# Patient Record
Sex: Female | Born: 1985 | Race: Black or African American | Hispanic: No
Health system: Midwestern US, Community
[De-identification: ages and names within clinical notes are randomized; demographics above are authoritative.]

---

## 2011-06-07 ENCOUNTER — Ambulatory Visit: Payer: Self-pay | Admitting: Internal Medicine

## 2011-06-22 ENCOUNTER — Inpatient Hospital Stay: Payer: Self-pay | Admitting: Internal Medicine

## 2011-07-08 ENCOUNTER — Ambulatory Visit: Payer: Self-pay | Admitting: Internal Medicine

## 2011-07-17 ENCOUNTER — Emergency Department: Payer: Self-pay | Admitting: Emergency Medicine

## 2011-08-07 ENCOUNTER — Ambulatory Visit: Payer: Self-pay | Admitting: Internal Medicine

## 2011-08-29 ENCOUNTER — Inpatient Hospital Stay: Payer: Self-pay | Admitting: Internal Medicine

## 2011-09-07 ENCOUNTER — Ambulatory Visit: Payer: Self-pay | Admitting: Internal Medicine

## 2011-10-07 ENCOUNTER — Ambulatory Visit: Payer: Self-pay | Admitting: Internal Medicine

## 2011-10-07 ENCOUNTER — Inpatient Hospital Stay: Payer: Self-pay | Admitting: Internal Medicine

## 2011-11-07 ENCOUNTER — Ambulatory Visit: Payer: Self-pay | Admitting: Internal Medicine

## 2011-11-11 ENCOUNTER — Emergency Department: Payer: Self-pay | Admitting: Emergency Medicine

## 2011-11-11 LAB — URINALYSIS, COMPLETE
Bilirubin,UR: NEGATIVE
Blood: NEGATIVE
Nitrite: NEGATIVE
Ph: 5 (ref 4.5–8.0)
Protein: 30
RBC,UR: 1 /HPF (ref 0–5)
Squamous Epithelial: 3

## 2011-11-11 LAB — COMPREHENSIVE METABOLIC PANEL
Alkaline Phosphatase: 51 U/L (ref 50–136)
Calcium, Total: 8.2 mg/dL — ABNORMAL LOW (ref 8.5–10.1)
Chloride: 113 mmol/L — ABNORMAL HIGH (ref 98–107)
Co2: 19 mmol/L — ABNORMAL LOW (ref 21–32)
EGFR (African American): >60
EGFR (Non-African Amer.): >60
Glucose: 88 mg/dL (ref 65–99)
Potassium: 3.7 mmol/L (ref 3.5–5.1)
SGOT(AST): 16 U/L (ref 15–37)
SGPT (ALT): 19 U/L
Total Protein: 7.6 g/dL (ref 6.4–8.2)

## 2011-11-11 LAB — CBC WITH DIFFERENTIAL/PLATELET
Bands: 1 %
Basophil: 1 %
HCT: 25.5 % — ABNORMAL LOW (ref 35.0–47.0)
Lymphocytes: 35 %
MCHC: 32.9 g/dL (ref 32.0–36.0)
MCV: 98 fL (ref 80–100)
Monocytes: 10 %
NRBC/100 WBC: 2 /
RBC: 2.6 10*6/uL — ABNORMAL LOW (ref 3.80–5.20)
RDW: 16.7 % — ABNORMAL HIGH (ref 11.5–14.5)
Segmented Neutrophils: 51 %
WBC: 12.6 10*3/uL — ABNORMAL HIGH (ref 3.6–11.0)

## 2011-11-11 LAB — PREGNANCY, URINE: Pregnancy Test, Urine: NEGATIVE m[IU]/mL

## 2011-11-12 LAB — RETICULOCYTES
Absolute Retic Count: 0.091 10*6/uL — ABNORMAL HIGH (ref 0.024–0.084)
Reticulocyte: 3.5 % — ABNORMAL HIGH (ref 0.5–1.5)

## 2011-12-06 ENCOUNTER — Inpatient Hospital Stay: Payer: Self-pay | Admitting: Internal Medicine

## 2011-12-06 LAB — URINALYSIS, COMPLETE
Bilirubin,UR: NEGATIVE
Blood: NEGATIVE
Glucose,UR: NEGATIVE mg/dL (ref 0–75)
Hyaline Cast: 5
Ketone: NEGATIVE
Nitrite: NEGATIVE
Squamous Epithelial: 3

## 2011-12-06 LAB — COMPREHENSIVE METABOLIC PANEL
Albumin: 4.1 g/dL (ref 3.4–5.0)
Alkaline Phosphatase: 47 U/L — ABNORMAL LOW (ref 50–136)
Anion Gap: 12 (ref 7–16)
BUN: 9 mg/dL (ref 7–18)
Bilirubin,Total: 0.5 mg/dL (ref 0.2–1.0)
Calcium, Total: 8.7 mg/dL (ref 8.5–10.1)
EGFR (African American): >60
EGFR (Non-African Amer.): >60
Glucose: 109 mg/dL — ABNORMAL HIGH (ref 65–99)
Osmolality: 281 (ref 275–301)
Potassium: 3.4 mmol/L — ABNORMAL LOW (ref 3.5–5.1)
SGPT (ALT): 22 U/L
Sodium: 141 mmol/L (ref 136–145)
Total Protein: 7.5 g/dL (ref 6.4–8.2)

## 2011-12-06 LAB — CBC
Platelet: 385 10*3/uL (ref 150–440)
RBC: 2.38 10*6/uL — ABNORMAL LOW (ref 3.80–5.20)
RDW: 17.8 % — ABNORMAL HIGH (ref 11.5–14.5)
WBC: 10.6 10*3/uL (ref 3.6–11.0)

## 2011-12-06 LAB — RETICULOCYTES: Reticulocyte: 3.8 % — ABNORMAL HIGH (ref 0.5–1.5)

## 2011-12-07 LAB — CBC WITH DIFFERENTIAL/PLATELET
Basophil %: 0.6 %
Eosinophil #: 0.4 10*3/uL (ref 0.0–0.7)
Eosinophil %: 3.6 %
HGB: 6.4 g/dL — ABNORMAL LOW (ref 12.0–16.0)
Lymphocyte #: 4.5 10*3/uL — ABNORMAL HIGH (ref 1.0–3.6)
MCH: 32.6 pg (ref 26.0–34.0)
MCHC: 33.2 g/dL (ref 32.0–36.0)
MCV: 98 fL (ref 80–100)
Monocyte #: 0.3 10*3/uL (ref 0.0–0.7)
Neutrophil #: 5.3 10*3/uL (ref 1.4–6.5)
Neutrophil %: 50.2 %
RBC: 1.96 10*6/uL — ABNORMAL LOW (ref 3.80–5.20)
RDW: 17.7 % — ABNORMAL HIGH (ref 11.5–14.5)

## 2011-12-07 LAB — COMPREHENSIVE METABOLIC PANEL
Alkaline Phosphatase: 46 U/L — ABNORMAL LOW (ref 50–136)
BUN: 5 mg/dL — ABNORMAL LOW (ref 7–18)
Bilirubin,Total: 0.7 mg/dL (ref 0.2–1.0)
Chloride: 116 mmol/L — ABNORMAL HIGH (ref 98–107)
Creatinine: 0.64 mg/dL (ref 0.60–1.30)
EGFR (African American): >60
EGFR (Non-African Amer.): >60
Glucose: 105 mg/dL — ABNORMAL HIGH (ref 65–99)
SGPT (ALT): 18 U/L
Sodium: 143 mmol/L (ref 136–145)
Total Protein: 6.4 g/dL (ref 6.4–8.2)

## 2011-12-10 LAB — BASIC METABOLIC PANEL
Anion Gap: 6 — ABNORMAL LOW (ref 7–16)
BUN: 5 mg/dL — ABNORMAL LOW (ref 7–18)
Chloride: 113 mmol/L — ABNORMAL HIGH (ref 98–107)
Co2: 23 mmol/L (ref 21–32)
Creatinine: 0.67 mg/dL (ref 0.60–1.30)
EGFR (African American): >60
EGFR (Non-African Amer.): >60
Glucose: 110 mg/dL — ABNORMAL HIGH (ref 65–99)
Osmolality: 281 (ref 275–301)

## 2011-12-10 LAB — HEMOGLOBIN: HGB: 7.9 g/dL — ABNORMAL LOW (ref 12.0–16.0)

## 2011-12-10 LAB — RETICULOCYTES
Absolute Retic Count: 0.079 10*6/uL (ref 0.024–0.084)
Reticulocyte: 3.2 % — ABNORMAL HIGH (ref 0.5–1.5)

## 2011-12-11 LAB — CULTURE, BLOOD (SINGLE)

## 2012-01-02 ENCOUNTER — Emergency Department: Payer: Self-pay | Admitting: *Deleted

## 2012-01-02 LAB — COMPREHENSIVE METABOLIC PANEL
Albumin: 4.1 g/dL (ref 3.4–5.0)
Anion Gap: 13 (ref 7–16)
Calcium, Total: 8.5 mg/dL (ref 8.5–10.1)
Chloride: 116 mmol/L — ABNORMAL HIGH (ref 98–107)
Co2: 15 mmol/L — ABNORMAL LOW (ref 21–32)
EGFR (African American): >60
EGFR (Non-African Amer.): >60
Osmolality: 286 (ref 275–301)
Potassium: 3 mmol/L — ABNORMAL LOW (ref 3.5–5.1)
SGOT(AST): 12 U/L — ABNORMAL LOW (ref 15–37)
SGPT (ALT): 14 U/L
Sodium: 144 mmol/L (ref 136–145)

## 2012-01-02 LAB — PREGNANCY, URINE: Pregnancy Test, Urine: NEGATIVE m[IU]/mL

## 2012-01-02 LAB — CBC WITH DIFFERENTIAL/PLATELET
Basophil #: 0 10*3/uL (ref 0.0–0.1)
Basophil %: 0.3 %
Eosinophil #: 0.2 10*3/uL (ref 0.0–0.7)
Eosinophil %: 2.4 %
HCT: 22.2 % — ABNORMAL LOW (ref 35.0–47.0)
HGB: 7.3 g/dL — ABNORMAL LOW (ref 12.0–16.0)
Lymphocyte %: 30.7 %
MCH: 33 pg (ref 26.0–34.0)
MCHC: 32.8 g/dL (ref 32.0–36.0)
MCV: 101 fL — ABNORMAL HIGH (ref 80–100)
Monocyte #: 0.5 10*3/uL (ref 0.0–0.7)
Monocyte %: 5.5 %
Monocytes: 3 %
Neutrophil #: 5.6 10*3/uL (ref 1.4–6.5)
Neutrophil %: 61.1 %
RBC: 2.21 10*6/uL — ABNORMAL LOW (ref 3.80–5.20)
WBC: 9.2 10*3/uL (ref 3.6–11.0)

## 2012-01-02 LAB — RETICULOCYTES: Reticulocyte: 3.6 % — ABNORMAL HIGH (ref 0.5–1.5)

## 2012-01-03 LAB — URINALYSIS, COMPLETE
Glucose,UR: NEGATIVE mg/dL (ref 0–75)
Ketone: NEGATIVE
Nitrite: NEGATIVE
Ph: 6 (ref 4.5–8.0)
RBC,UR: <1 /HPF (ref 0–5)
Specific Gravity: 1.02 (ref 1.003–1.030)
Squamous Epithelial: 6
WBC UR: 4 /HPF (ref 0–5)

## 2012-01-05 ENCOUNTER — Ambulatory Visit: Payer: Self-pay | Admitting: Internal Medicine

## 2012-01-23 LAB — URINALYSIS, COMPLETE
Glucose,UR: NEGATIVE mg/dL (ref 0–75)
Hyaline Cast: 3
Ketone: NEGATIVE
Leukocyte Esterase: NEGATIVE
Nitrite: NEGATIVE
Ph: 5 (ref 4.5–8.0)
Squamous Epithelial: 5
White Blood Cell Cast: 1

## 2012-01-23 LAB — COMPREHENSIVE METABOLIC PANEL
Albumin: 4.7 g/dL (ref 3.4–5.0)
Alkaline Phosphatase: 56 U/L (ref 50–136)
Anion Gap: 15 (ref 7–16)
Bilirubin,Total: 0.4 mg/dL (ref 0.2–1.0)
Co2: 12 mmol/L — ABNORMAL LOW (ref 21–32)
Creatinine: 0.79 mg/dL (ref 0.60–1.30)
Osmolality: 279 (ref 275–301)
Sodium: 140 mmol/L (ref 136–145)

## 2012-01-23 LAB — CBC
HGB: 8.2 g/dL — ABNORMAL LOW (ref 12.0–16.0)
MCH: 34.6 pg — ABNORMAL HIGH (ref 26.0–34.0)
MCV: 108 fL — ABNORMAL HIGH (ref 80–100)
Platelet: 262 10*3/uL (ref 150–440)
RBC: 2.36 10*6/uL — ABNORMAL LOW (ref 3.80–5.20)
RDW: 23.3 % — ABNORMAL HIGH (ref 11.5–14.5)
WBC: 8.3 10*3/uL (ref 3.6–11.0)

## 2012-01-23 LAB — LACTATE DEHYDROGENASE: LDH: 182 U/L (ref 84–246)

## 2012-01-23 LAB — DIFFERENTIAL
Basophil #: 0 10*3/uL (ref 0.0–0.1)
Eosinophil #: 0.3 10*3/uL (ref 0.0–0.7)
Eosinophil %: 3.9 %
Lymphocyte %: 41.9 %
Monocyte %: 6.7 %
Neutrophil #: 3.9 10*3/uL (ref 1.4–6.5)
Neutrophil %: 47.2 %

## 2012-01-23 LAB — RETICULOCYTES: Absolute Retic Count: 0.053 10*6/uL (ref 0.024–0.084)

## 2012-01-24 LAB — BASIC METABOLIC PANEL
BUN: 9 mg/dL (ref 7–18)
Chloride: 121 mmol/L — ABNORMAL HIGH (ref 98–107)
Creatinine: 0.67 mg/dL (ref 0.60–1.30)
EGFR (Non-African Amer.): >60
Glucose: 99 mg/dL (ref 65–99)
Potassium: 4.3 mmol/L (ref 3.5–5.1)
Sodium: 148 mmol/L — ABNORMAL HIGH (ref 136–145)

## 2012-01-25 ENCOUNTER — Inpatient Hospital Stay: Payer: Self-pay | Admitting: Internal Medicine

## 2012-01-25 LAB — CBC WITH DIFFERENTIAL/PLATELET
Basophil #: 0.2 10*3/uL — ABNORMAL HIGH (ref 0.0–0.1)
Basophil %: 2 %
Eosinophil #: 0.4 10*3/uL (ref 0.0–0.7)
Eosinophil %: 3.3 %
HGB: 7.1 g/dL — ABNORMAL LOW (ref 12.0–16.0)
Lymphocyte #: 3.8 10*3/uL — ABNORMAL HIGH (ref 1.0–3.6)
MCH: 35.3 pg — ABNORMAL HIGH (ref 26.0–34.0)
MCHC: 33.1 g/dL (ref 32.0–36.0)
MCV: 107 fL — ABNORMAL HIGH (ref 80–100)
Monocyte #: 0.8 10*3/uL — ABNORMAL HIGH (ref 0.0–0.7)
Neutrophil #: 6.2 10*3/uL (ref 1.4–6.5)
Platelet: 226 10*3/uL (ref 150–440)
RBC: 2 10*6/uL — ABNORMAL LOW (ref 3.80–5.20)
RDW: 24 % — ABNORMAL HIGH (ref 11.5–14.5)
WBC: 11.4 10*3/uL — ABNORMAL HIGH (ref 3.6–11.0)

## 2012-01-25 LAB — COMPREHENSIVE METABOLIC PANEL
Albumin: 4.3 g/dL (ref 3.4–5.0)
Alkaline Phosphatase: 60 U/L (ref 50–136)
Anion Gap: 10 (ref 7–16)
BUN: 7 mg/dL (ref 7–18)
Calcium, Total: 8.3 mg/dL — ABNORMAL LOW (ref 8.5–10.1)
Glucose: 72 mg/dL (ref 65–99)
Osmolality: 283 (ref 275–301)
Potassium: 4.3 mmol/L (ref 3.5–5.1)
SGOT(AST): 27 U/L (ref 15–37)
SGPT (ALT): 22 U/L
Total Protein: 7.4 g/dL (ref 6.4–8.2)

## 2012-01-27 LAB — BASIC METABOLIC PANEL
Chloride: 112 mmol/L — ABNORMAL HIGH (ref 98–107)
Co2: 19 mmol/L — ABNORMAL LOW (ref 21–32)
Creatinine: 0.6 mg/dL (ref 0.60–1.30)
Osmolality: 291 (ref 275–301)
Potassium: 3.8 mmol/L (ref 3.5–5.1)
Sodium: 146 mmol/L — ABNORMAL HIGH (ref 136–145)

## 2012-01-27 LAB — RETICULOCYTES
Absolute Retic Count: 0.027 10*6/uL (ref 0.024–0.084)
Reticulocyte: 1.4 % (ref 0.5–1.5)

## 2012-01-28 LAB — CBC WITH DIFFERENTIAL/PLATELET
Basophil #: 0.1 10*3/uL (ref 0.0–0.1)
Basophil %: 1.1 %
Eosinophil #: 0.3 10*3/uL (ref 0.0–0.7)
Eosinophil %: 3.1 %
Eosinophil: 2 %
HGB: 6.3 g/dL — ABNORMAL LOW (ref 12.0–16.0)
Lymphocyte %: 35.3 %
Lymphocytes: 35 %
MCHC: 32.7 g/dL (ref 32.0–36.0)
Monocytes: 3 %
Neutrophil %: 55.9 %
Platelet: 235 10*3/uL (ref 150–440)
RBC: 1.79 10*6/uL — ABNORMAL LOW (ref 3.80–5.20)
RDW: 23.4 % — ABNORMAL HIGH (ref 11.5–14.5)
Segmented Neutrophils: 60 %

## 2012-01-28 LAB — BASIC METABOLIC PANEL
Chloride: 112 mmol/L — ABNORMAL HIGH (ref 98–107)
Co2: 20 mmol/L — ABNORMAL LOW (ref 21–32)
Creatinine: 0.69 mg/dL (ref 0.60–1.30)
EGFR (Non-African Amer.): >60
Glucose: 133 mg/dL — ABNORMAL HIGH (ref 65–99)
Osmolality: 286 (ref 275–301)
Sodium: 144 mmol/L (ref 136–145)

## 2012-01-29 LAB — RETICULOCYTES: Reticulocyte: 2.7 % — ABNORMAL HIGH (ref 0.5–1.5)

## 2012-01-29 LAB — CBC WITH DIFFERENTIAL/PLATELET
Eosinophil %: 3.1 %
Eosinophil: 6 %
MCHC: 32.5 g/dL (ref 32.0–36.0)
MCV: 108 fL — ABNORMAL HIGH (ref 80–100)
Monocyte #: 0.4 10*3/uL (ref 0.0–0.7)
Monocyte %: 6.1 %
Monocytes: 8 %
Neutrophil #: 3.7 10*3/uL (ref 1.4–6.5)
Neutrophil %: 51.7 %
RDW: 23.1 % — ABNORMAL HIGH (ref 11.5–14.5)

## 2012-01-29 LAB — BASIC METABOLIC PANEL
Anion Gap: 11 (ref 7–16)
BUN: 5 mg/dL — ABNORMAL LOW (ref 7–18)
EGFR (African American): >60
EGFR (Non-African Amer.): >60
Glucose: 80 mg/dL (ref 65–99)
Osmolality: 285 (ref 275–301)
Potassium: 3.9 mmol/L (ref 3.5–5.1)

## 2012-01-30 LAB — CULTURE, BLOOD (SINGLE)

## 2012-02-01 ENCOUNTER — Emergency Department: Payer: Self-pay | Admitting: Emergency Medicine

## 2012-02-01 LAB — CBC WITH DIFFERENTIAL/PLATELET
HGB: 8.1 g/dL — ABNORMAL LOW (ref 12.0–16.0)
MCH: 35.5 pg — ABNORMAL HIGH (ref 26.0–34.0)
MCHC: 32.2 g/dL (ref 32.0–36.0)
Other Cells Blood: 14
Segmented Neutrophils: 49 %
WBC: 6.7 10*3/uL (ref 3.6–11.0)

## 2012-02-01 LAB — COMPREHENSIVE METABOLIC PANEL
Albumin: 4.6 g/dL (ref 3.4–5.0)
Alkaline Phosphatase: 55 U/L (ref 50–136)
BUN: 8 mg/dL (ref 7–18)
Bilirubin,Total: 0.6 mg/dL (ref 0.2–1.0)
Chloride: 109 mmol/L — ABNORMAL HIGH (ref 98–107)
Osmolality: 280 (ref 275–301)
Potassium: 3.4 mmol/L — ABNORMAL LOW (ref 3.5–5.1)
SGOT(AST): 21 U/L (ref 15–37)
Sodium: 141 mmol/L (ref 136–145)
Total Protein: 8.2 g/dL (ref 6.4–8.2)

## 2012-02-02 LAB — URINALYSIS, COMPLETE
Bilirubin,UR: NEGATIVE
Glucose,UR: NEGATIVE mg/dL (ref 0–75)
Hyaline Cast: 6
Ketone: NEGATIVE
Leukocyte Esterase: NEGATIVE
Nitrite: NEGATIVE
Ph: 5 (ref 4.5–8.0)
Specific Gravity: 1.014 (ref 1.003–1.030)
Squamous Epithelial: 2

## 2012-02-05 ENCOUNTER — Ambulatory Visit: Payer: Self-pay | Admitting: Internal Medicine

## 2012-03-02 ENCOUNTER — Emergency Department: Payer: Self-pay | Admitting: Emergency Medicine

## 2012-03-03 LAB — URINALYSIS, COMPLETE
Bacteria: NONE SEEN
Bilirubin,UR: NEGATIVE
Glucose,UR: NEGATIVE mg/dL (ref 0–75)
Ketone: NEGATIVE
Nitrite: NEGATIVE
Ph: 6 (ref 4.5–8.0)
Protein: 30
Specific Gravity: 1.012 (ref 1.003–1.030)
WBC UR: 15 /HPF (ref 0–5)

## 2012-03-03 LAB — COMPREHENSIVE METABOLIC PANEL
Albumin: 4.6 g/dL (ref 3.4–5.0)
BUN: 21 mg/dL — ABNORMAL HIGH (ref 7–18)
Calcium, Total: 8.8 mg/dL (ref 8.5–10.1)
Creatinine: 0.63 mg/dL (ref 0.60–1.30)
EGFR (Non-African Amer.): >60
Glucose: 91 mg/dL (ref 65–99)
Potassium: 3.2 mmol/L — ABNORMAL LOW (ref 3.5–5.1)
SGPT (ALT): 28 U/L
Sodium: 139 mmol/L (ref 136–145)
Total Protein: 9.1 g/dL — ABNORMAL HIGH (ref 6.4–8.2)

## 2012-03-03 LAB — PREGNANCY, URINE: Pregnancy Test, Urine: NEGATIVE m[IU]/mL

## 2012-03-03 LAB — CBC
HGB: 9 g/dL — ABNORMAL LOW (ref 12.0–16.0)
MCH: 34.8 pg — ABNORMAL HIGH (ref 26.0–34.0)
Platelet: 380 10*3/uL (ref 150–440)
RDW: 18 % — ABNORMAL HIGH (ref 11.5–14.5)
WBC: 12.2 10*3/uL — ABNORMAL HIGH (ref 3.6–11.0)

## 2012-03-03 LAB — RETICULOCYTES: Absolute Retic Count: 0.0399 10*6/uL (ref 0.024–0.084)

## 2012-03-03 LAB — LIPASE, BLOOD: Lipase: 166 U/L (ref 73–393)

## 2012-03-04 LAB — URINE CULTURE

## 2012-05-13 ENCOUNTER — Emergency Department: Payer: Self-pay | Admitting: Emergency Medicine

## 2012-05-13 LAB — CBC WITH DIFFERENTIAL/PLATELET
Basophil #: 0 10*3/uL (ref 0.0–0.1)
Basophil %: 0.5 %
Eosinophil #: 0.1 10*3/uL (ref 0.0–0.7)
HGB: 8.6 g/dL — ABNORMAL LOW (ref 12.0–16.0)
MCH: 31.2 pg (ref 26.0–34.0)
MCHC: 33.2 g/dL (ref 32.0–36.0)
Monocyte #: 0.5 x10 3/mm (ref 0.2–0.9)
Neutrophil %: 60.1 %
Platelet: 415 10*3/uL (ref 150–440)
RDW: 16 % — ABNORMAL HIGH (ref 11.5–14.5)

## 2012-05-13 LAB — URINALYSIS, COMPLETE
Glucose,UR: NEGATIVE mg/dL (ref 0–75)
Hyaline Cast: 7
Ketone: NEGATIVE
Ph: 5 (ref 4.5–8.0)
Protein: NEGATIVE
Specific Gravity: 1.024 (ref 1.003–1.030)
Squamous Epithelial: 11
WBC UR: 5 /HPF (ref 0–5)

## 2012-05-13 LAB — BASIC METABOLIC PANEL
Anion Gap: 14 (ref 7–16)
BUN: 11 mg/dL (ref 7–18)
Chloride: 113 mmol/L — ABNORMAL HIGH (ref 98–107)
Co2: 12 mmol/L — ABNORMAL LOW (ref 21–32)
Osmolality: 277 (ref 275–301)

## 2012-05-13 LAB — RETICULOCYTES: Absolute Retic Count: 0.085 10*6/uL — ABNORMAL HIGH (ref 0.024–0.084)

## 2012-05-13 LAB — PREGNANCY, URINE: Pregnancy Test, Urine: NEGATIVE m[IU]/mL

## 2013-03-21 IMAGING — XA IR VASCULAR PROCEDURE
9 series · 14 of 14 positions shown · IV contrast (IODINE)
Comparison: none

[Series 1: single · 1 of 1 slices shown (1 of 4)]
[im 1/1]
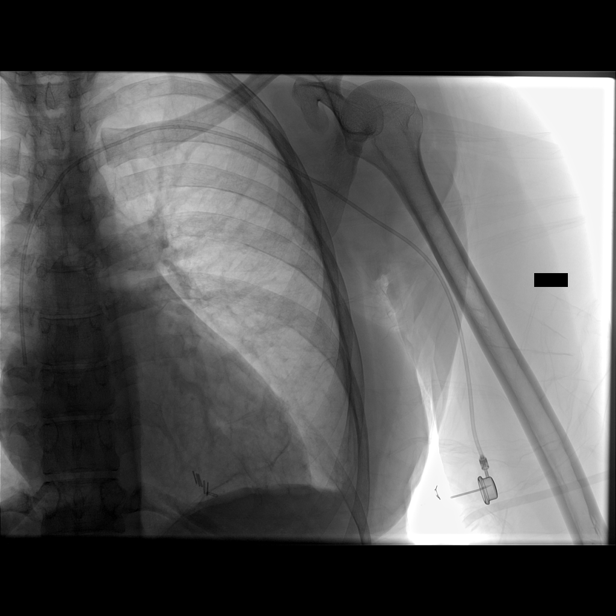

[Series 2: venogram · 2 of 2 slices shown (1 of 5)]
[im 1/2]
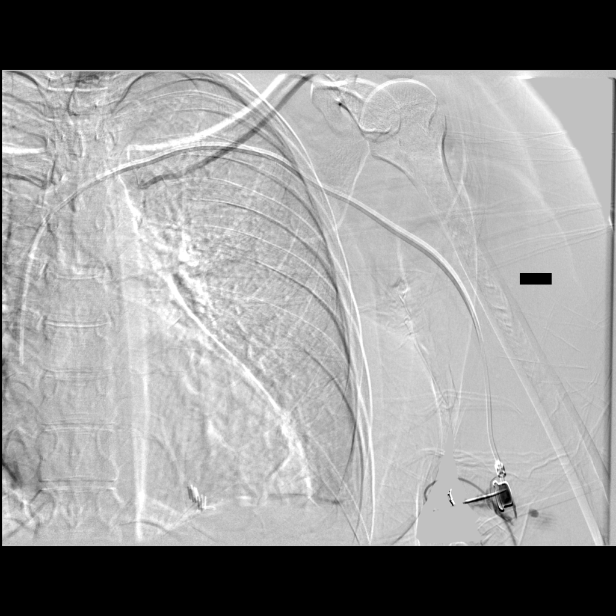
[im 2/2]
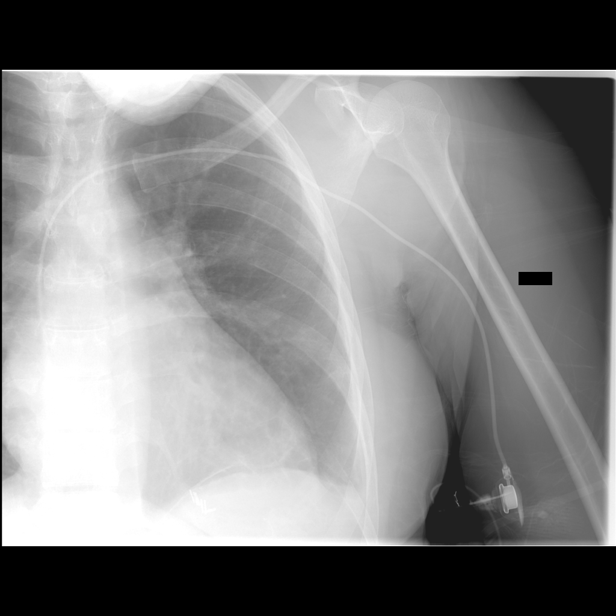

[Series 3: single · 1 of 1 slices shown (2 of 4)]
[im 1/1]
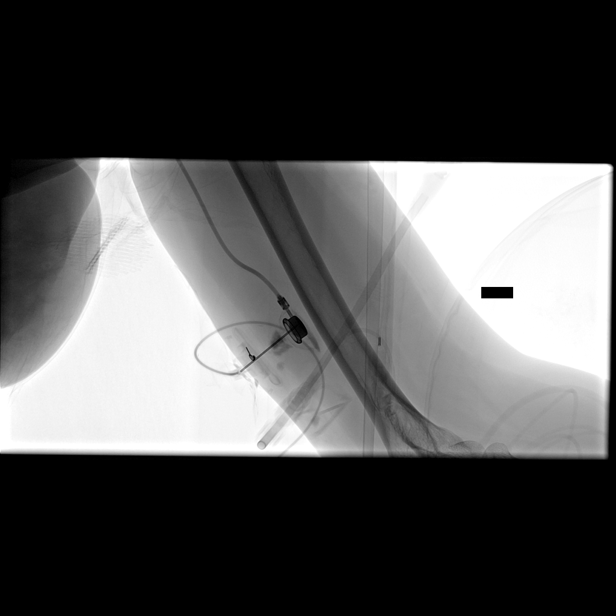

[Series 4: single · 1 of 1 slices shown (3 of 4)]
[im 1/1]
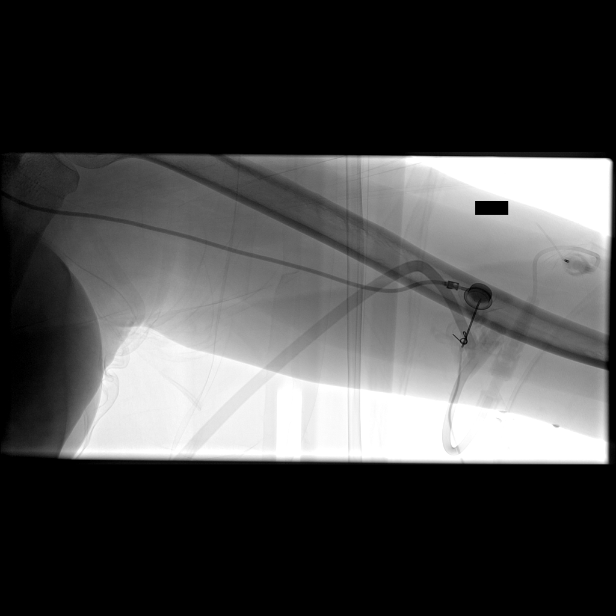

[Series 5: single · 1 of 1 slices shown (4 of 4)]
[im 1/1]
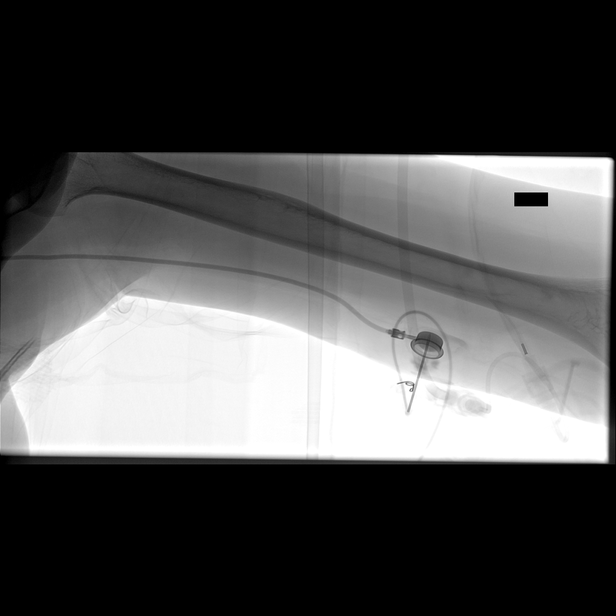

[Series 6: venogram · 2 of 2 slices shown (2 of 5)]
[im 1/2]
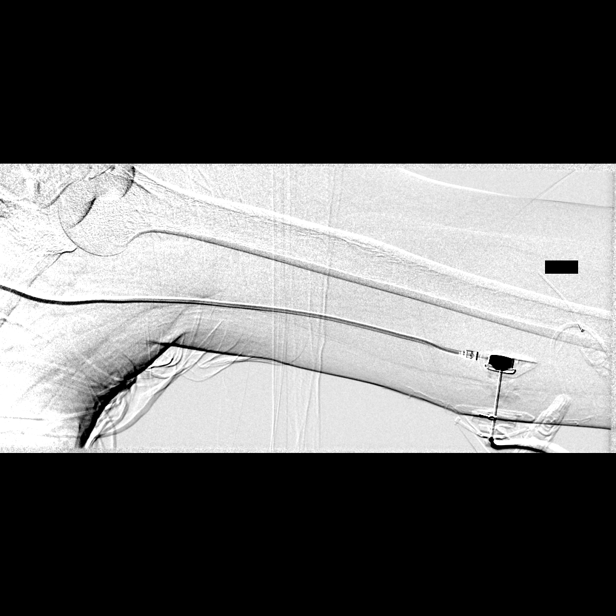
[im 2/2]
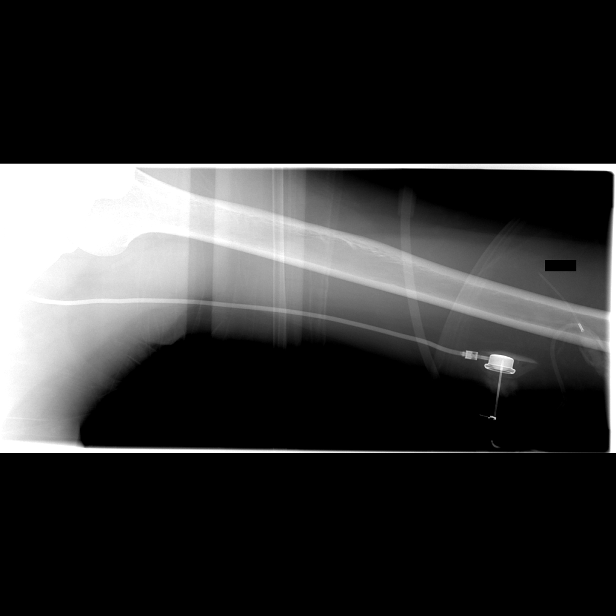

[Series 7: venogram · 2 of 2 slices shown (3 of 5)]
[im 1/2]
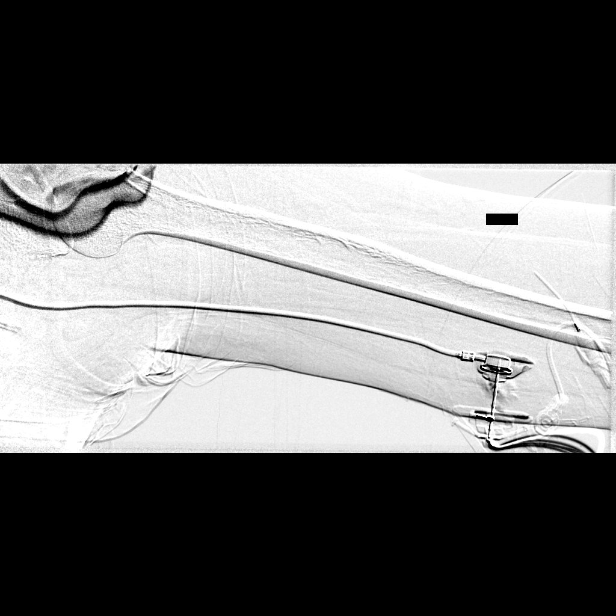
[im 2/2]
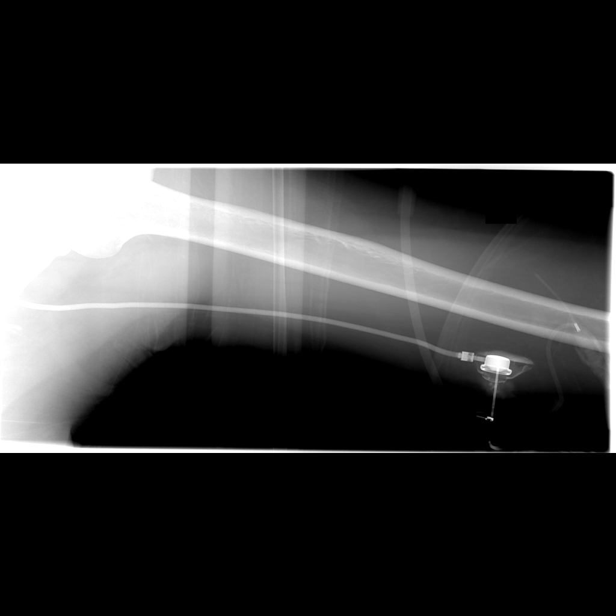

[Series 8: venogram · 2 of 2 slices shown (4 of 5)]
[im 1/2]
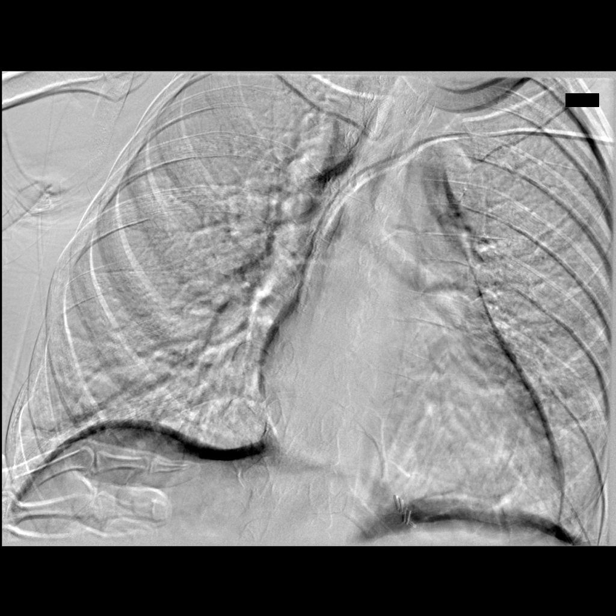
[im 2/2]
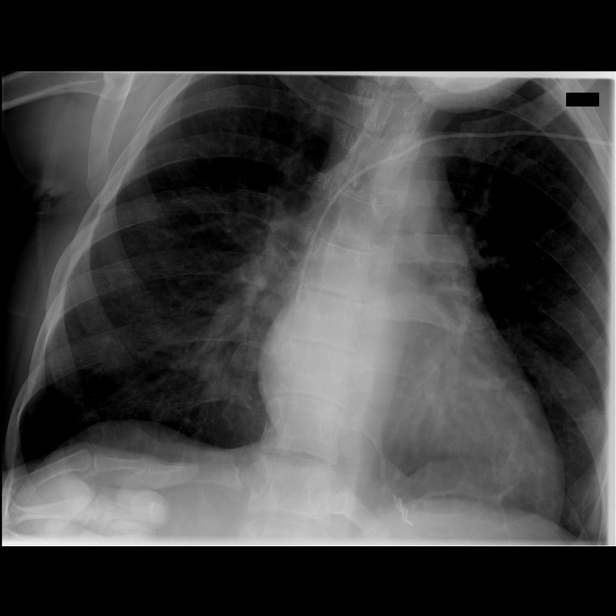

[Series 9: venogram · 2 of 2 slices shown (5 of 5)]
[im 1/2]
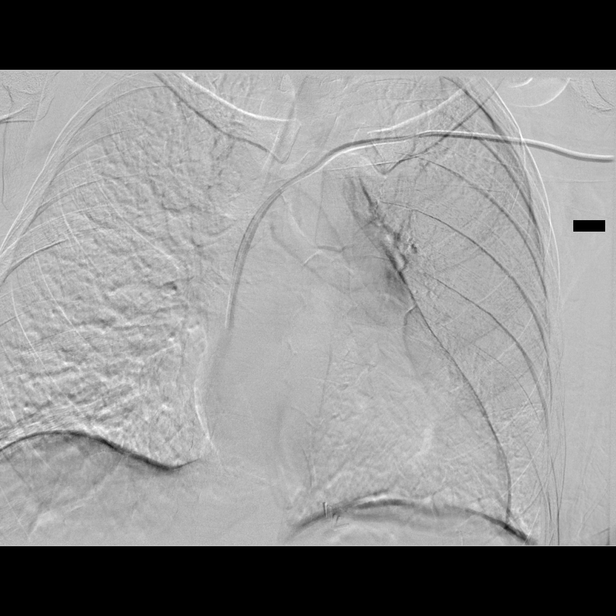
[im 2/2]
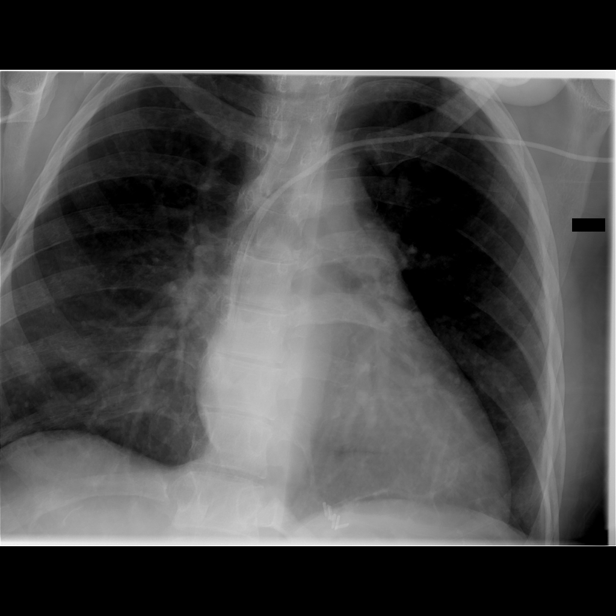

[14 of 14 positions shown; findings below may reference images not displayed]

IMAGES IMPORTED FROM THE [employer] WORKFLOW SYSTEM
NO DICTATION FOR STUDY

## 2015-02-28 NOTE — Op Note (Signed)
PATIENT NAME:  [name], Salote MR#:  [id number] DATE OF BIRTH:  February 12, 1986  DATE OF PROCEDURE:  01/25/2012  PREOPERATIVE DIAGNOSES:  1. Currently nonfunctional Port-A-Cath.  2. Sickle cell disease.   POSTOPERATIVE DIAGNOSES:  1. Currently nonfunctional Port-A-Cath.  2. Sickle cell disease.   PROCEDURE PERFORMED: Injection of contrast through Port-A-Cath for evaluation.   SURGEON: Annice NeedyJason S. Maymie Brunke, M.D.   ANESTHESIA: Local.   ESTIMATED BLOOD LOSS: Minimal.   INDICATION FOR PROCEDURE: This is a 29 year old African American female with sickle cell disease. She has had a Port-A-Cath that was placed interestingly in her left arm that has been used for four years. It was working earlier on admission but apparently this morning it would not work. We are asked to evaluate this.  DESCRIPTION OF PROCEDURE: The patient is brought down to special procedures, [facility]. On the initial injection, there was extravasation of contrast from the port and appeared the needle was not in the actual Port-A-Cath. The needle was removed and reaccessed. This time there was aspiration of blood from the port and injection of           contrast was performed which showed good flow of contrast through the port into the right atrium. At this point, the procedure was terminated and it will be okay to use her port. ____________________________ Annice NeedyJason S. Padraic Marinos, MD jsd:slb D: 01/25/2012 15:33:48 ET T: 01/25/2012 16:57:57 ET JOB#: [id number]  cc: Annice NeedyJason S. Rogers Ditter, MD, <Dictator> Annice NeedyJASON S Jhayla Podgorski MD ELECTRONICALLY SIGNED 01/26/2012 17:18
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
## 2015-02-28 NOTE — Discharge Summary (Signed)
PATIENT NAMMaryanna Berry:  Berry, Renee MR#:  [id number] DATE OF BIRTH:  03-Apr-1986  DATE OF ADMISSION:  01/25/2012 DATE OF DISCHARGE:  01/29/2012  ADMITTING PHYSICIAN: Katharina Caperima Vaickute, MD   DISCHARGING PHYSICIAN: Larena GlassmanAmir Keelynn Furgerson, MD    PRIMARY CARE PHYSICIAN: [facility]    REASON FOR ADMISSION: Nausea, vomiting, diarrhea.   DISCHARGE DIAGNOSES:  1. Nausea, vomiting, diarrhea, possibly viral gastroenteritis. 2. Sickle cell disease/crisis and anemia. 3. Tobacco abuse, ongoing. 4. Poor dentition. Follow-up with outpatient dentistry.  5. Hypernatremia.  6. Hypokalemia.  7. Drug-seeking behavior.   CONSULTANTS:  1. Dr. Mare FerrariLavine  2. Dr. Sherrlyn HockPandit  3. Case Management    TESTS DONE DURING THIS HOSPITALIZATION: Chest x-ray 01/23/2012 showed no acute disease of the chest.   HOSPITAL COURSE: Initial history and physical were done by Dr. Winona LegatoVaickute. Please refer to her note dated 01/24/2012 for complete details. In brief, this is a 29 year old African American female with past medical history of sickle cell disease who presented with nausea, vomiting, diarrhea. The patient was admitted to the hospitalist service.  1. Nausea, vomiting, diarrhea most likely due to gastroenteritis, resolved with IV fluids and antiemetics.  2. Acidosis probably from viral gastroenteritis or sickle cell crisis, improved with IV fluids and rehydration.  3. Sickle cell crisis anemia. The patient had IV hydration and IV Dilaudid, switched to oral Dilaudid. The patient's hemoglobin dropped. Retic count was appropriate. Dr. Sherrlyn HockPandit saw the patient. The patient was asymptomatic and not recommended for any transfusion. The patient continued to ask for IV Dilaudid, has history of drug-seeking behavior. Her symptoms were not consistent with her physical exam. Dr. Mare FerrariLavine saw the patient and agreed with this.  4. Tobacco abuse. The patient was on Nicotrol Inhaler, nicotine patch, and counseled about cessation. 5. Poor dentition. The  patient was recommended for outpatient follow-up with dentistry.  6. Hypernatremia. The patient was given IV fluids and this resolved.  7. Hypokalemia. This was repleted.  8. DVT prophylaxis. The patient was ambulatory.   The patient was discharged on 0325/2013. Temperature 99.5, heart rate 87, respiratory rate 18, blood pressure 118/72, sating 98% on room air. LUNGS: Clear to auscultation. CARDIOVASCULAR: Regular rate and rhythm. ABDOMEN: Benign.   DISCHARGE MEDICATIONS:  1. Nexium 40 mg daily.  2. Docusate 100 mg daily.  3. Zolpidem 10 mg p.o. at bedtime.  4. Citalopram 20 mg daily.  5. Folic acid 1 mg daily.  6. Hydroxyurea 500 mg 2 capsules once a day. 7. Calcium carbonate 600 mg daily.  8. Dilaudid 4 mg 1 tablet every four hours as needed.  9. Promethazine 25 mg every six hours.  OXYGEN: None.   DIET: Regular.   ACTIVITY: As tolerated.   FOLLOW-UP: The patient is to see [facility] 7 to 10 days.   CODE STATUS: FULL CODE.   TOTAL TIME SPENT ON DISCHARGE: 45 minutes.   ____________________________ Corie ChiquitoAmir A. Lafayette DragonFirozvi, MD aaf:drc D: 01/29/2012 16:23:22 ET T: 01/30/2012 12:42:04 ET JOB#: [id number]  cc: Karolee OhsAmir A. [place] DragonFirozvi, MD, <Dictator> Sandeep R. Sherrlyn HockPandit, MD Maryjean MornPhilip H. Mare FerrariLavine, MD [facility]  Coralyn PearAMIR A Kiel Cockerell MD ELECTRONICALLY SIGNED 01/30/2012 13:04

## 2015-02-28 NOTE — H&P (Signed)
PATIENT NAME:  [name], [name] MR#:  [id number] DATE OF BIRTH:  04/15/1986  DATE OF ADMISSION:  01/24/2012  PRIMARY CARE PHYSICIAN: [facility]   HISTORY OF PRESENT ILLNESS: The patient is a 29 year old African American female with past medical history significant for history of sickle cell disease who presented to the hospital with complaints of nausea and vomiting as well as diarrhea. According to the patient, she started having diarrhea approximately three days ago. She also had intermittent nausea and vomiting. She is not able to eat well because of nausea and vomiting. She had at least vomiting five times yesterday. Because of that nausea and vomiting, she has been having rib cage pains. She is also complaining of pains all over her body including in her back and in her upper and lower extremities. She admits of having some fevers over the past few days. She was noted to have temperature of 99.4 in the Emergency Room and hospitalist services were contacted for admission as the patient did not improve significantly with Phenergan, Dilaudid, as well as Benadryl.  PAST MEDICAL HISTORY:  1. Admission for sickle cell pain crisis in December 2012 with most recent admission recently in January to February 2013. She was discharged on 12/10/2011 on antibiotic therapy. It was felt that the patient's sickle cell pain crisis was related to tooth abscess.  2. History of chronic anemia secondary to sickle cell status post packed red blood cell transfusion in the past.  3. History of leukocytosis which was felt to be of unclear etiology on December admission. 4. History of depression. 5. Chronic diarrhea. 6. History of tobacco abuse.  7. Hypertension.  8. Abdominal hernia.  9. Status post splenectomy.

## 2015-02-28 NOTE — Consult Note (Signed)
PATIENT NAME:  [name], [name] MR#:  [id number] DATE OF BIRTH:  1986/03/20  DATE OF CONSULTATION:  01/28/2012  REFERRING PHYSICIAN:   Larena Glassman, MD CONSULTING PHYSICIAN:  Thersa Mohiuddin R. Sherrlyn Hock, MD  REASON FOR CONSULTATION: Sickle cell disease.   HISTORY OF PRESENT ILLNESS: The patient is a 29 year old African American female with known history of sickle cell disease who was admitted to the hospital on 03/20 with complaints of nausea and vomiting along with recent diarrheal illness, which started about three days prior to admission. The patient also did not eat well and felt weak and dehydrated. She also had mild fevers and in the Emergency Room temperature was 99.4. When she had nausea and vomiting she had significant pain in both ribs. She also developed generalized bone and joint pains and is currently admitted for sickle cell pain crisis. Labs on 03/19 showed hemoglobin of 8.2, MCV 108, WBC 8300, platelet count 262. With IV hydration, hemoglobin has drifted down and CBC today shows hemoglobin of 6.3 with platelet count 235, WBC 8900, ANC 5000. The patient takes hydroxyurea 1000 mg daily. Her baseline hemoglobin usually is in the range of 7 to 8 grams. Currently denies any shortness of breath, chest pain, or focal neurological weakness. Had generalized fatigue on activity. Feels that she is beginning to eat better.   PAST MEDICAL HISTORY/ PAST SURGICAL HISTORY: 1. Hypertension.  2. Depression.  3. Sickle cell anemia with history of frequent hospitalizations for pain crisis.  4. Abdominal hernia repair.  5. Splenectomy.  6. Cholecystectomy.  7. History of bowel dissection during hernia repair.  8. History of lung abscess complicated with empyema in 2008.   FAMILY HISTORY: Remarkable for sickle cell disease, diabetes, and hypertension.   SOCIAL HISTORY: Denies smoking, alcohol, or recreational drug usage.   ALLERGIES: Fentanyl and morphine cause headache and itching, Rocephin.     MEDICATIONS: 1. Folic acid 1 mg daily.  2. Zolpidem 10 mg at bedtime.  3. Promethazine 25 mg q. 6 hours as needed.  4. Hydroxyurea 1000 mg b.i.d. 5. Nexium 40 mg daily.  6. Citalopram 20 mg daily.  7. Dilaudid 4 mg q. 4 hours p.r.n. 8. Calcium carbonate 600 mg daily.   REVIEW OF SYSTEMS: CONSTITUTIONAL: As in history of present illness. Currently no fevers. No night sweats. HEENT: Denies any dizziness, headaches, epistaxis, ear or jaw pain.  CARDIAC: As in history of present illness. No orthopnea or paroxysmal nocturnal dyspnea.  LUNGS: No major cough, sputum, hemoptysis, or chest pain. GI: Diarrhea is better. Has intermittent nausea but no vomiting. No blood in stools or melena. GU: No dysuria or hematuria. MUSCULOSKELETAL: As above in history of present illness. SKIN: No new rashes. HEMATOLOGIC: Denies obvious bleeding symptoms. NEUROLOGIC: No recent focal weakness, seizures, or loss of consciousness.   PHYSICAL EXAMINATION:  GENERAL: Patient is tired looking, resting in bed. Otherwise alert and oriented and converses appropriately. No acute distress. Pallor present. No icterus.   VITAL SIGNS: Temperature 98.7, 94, respirations 18, blood pressure 118/72, 96% on room air.   HEENT: Normocephalic, atraumatic. Extraocular movements intact. Sclerae anicteric. No oral thrush.   NECK: No lymphadenopathy.   CARDIOVASCULAR: S1, S2, regular rate and rhythm.   LUNGS: Lungs show bilateral good air entry, no crepitations or rhonchi.   ABDOMEN: Soft, nontender. No organomegaly.   EXTREMITIES: No major edema or cyanosis.   SKIN: No generalized rashes or major bruising.   NEUROLOGIC: Cranial nerves are intact, moves all extremities spontaneously.   LABORATORY, DIAGNOSTIC, AND RADIOLOGICAL  DATA: As in history of present illness above. In addition, creatinine 0.69, potassium 3.4, calcium 7.9, glucose 133. Absolute retic 0.027. Liver functions unremarkable. Blood cultures negative times 2.  Urine culture negative so far. Chest x-ray 03/19 showed no acute disease of the chest.   IMPRESSION AND RECOMMENDATIONS: This is a 29 year old female patient with known history of sickle cell disease, admitted with pain crisis involving ribs and generalized bones and joints, which started after she developed symptoms of gastroenteritis including diarrhea, nausea, and vomiting. Currently gastrointestinal symptoms seem to be improving.  Continue current supportive treatment. The patient does not have ongoing fever, hypoxia, or rales on exam and recent chest x-ray was unremarkable. Chronic fatigue is slightly worse and hemoglobin is lower at less than 7 grams, but the patient denies any severe anemia symptoms. I agree with ongoing treatment with continued pain control aggressively, IV hydration, oxygen supplemented as indicated by pulse oximetry, continue folic acid and hydroxyurea. Given that she does have anemia with low reticulocyte response, continue to monitor CBC and reticulocyte count and consider packed red blood cell transfusion if hemoglobin continues to significantly worsen or if she develops progressive fatigability or further anemia symptoms. Will continue to follow as needed during hospitalization. Upon discharge, the patient was advised to continue to be compliant with [facility] where she has been going for outpatient management. The patient is agreeable to this plan.   Thank you for the referral. Please feel free to contact me if additional questions.    ____________________________ Maren ReamerSandeep R. Sherrlyn HockPandit, MD srp:bjt D: 01/28/2012 21:17:00 ET T: 01/29/2012 05:52:40 ET JOB#: [id number]  cc: Uriyah Raska R. Sherrlyn HockPandit, MD, <Dictator> Wille CelesteSANDEEP R Rodnesha Elie MD ELECTRONICALLY SIGNED 01/29/2012 13:00

## 2015-02-28 NOTE — Consult Note (Signed)
Asked to evaluate the patient's port [facility]  It was working earlier on admission, but not today.evaluation, it appears that the access needed was not in adequate location.  Initial injection infiltrated.  reaccessed and withdrew blood.  Port injection showed contrast flowing into the right atrium.  OK to use port  Electronic Signatures: Annice Needyew, Jason S (MD)  (Signed on 21-Mar-13 13:29)  Authored  Last Updated: 21-Mar-13 13:29 by Annice Needyew, Jason S (MD)

## 2015-02-28 NOTE — Discharge Summary (Signed)
PATIENT NAME:  [name], Azya MR#:  [id number] DATE OF BIRTH:  [birth year]  DATE OF ADMISSION:  12/06/2011 DATE OF DISCHARGE:  12/10/2011  PRIMARY CARE PHYSICIAN: [facility]   FINAL DIAGNOSES:  1. Sickle cell crisis with pain.  2. Tooth abscess and poor dentition.  3. Low-grade fever.  4. Tobacco abuse.   MEDICATIONS ON DISCHARGE:  1. Promethazine 25 mg every six hours as needed for nausea. 2. Nexium 1 tablet daily.  3. Ranitidine 150 mg daily.  4. Colace 100 mg daily.  5. Ambien 10 mg at bedtime.  6. Calcium carbonate 1 tablet twice a day  7. Celexa 20 mg daily.  8. Dilaudid 4 mg every six hours as needed for pain.  9. Folic acid 1 mg daily.  10. Hydroxyurea 1000 mg p.o. daily.  11. Nicotine patch 7 mg chest wall daily.  12. Clindamycin 300 mg p.o. three times a day until completed.   ACTIVITY: As tolerated.   FOLLOW-UP:  1. [facility].  2. Follow-up with [facility] in 1 to 2 weeks.   REASON FOR ADMISSION: The patient was admitted 12/06/2011 and discharged 12/10/2011. The patient came in with sickle cell disease, started having flulike symptoms, coughing, sinus congestion, nausea, low-grade temperature, some diarrhea. She was admitted for sickle cell crisis, admitted to the medical floor, given IV fluids, pain medications. For fever, blood cultures and urine cultures were ordered. The patient was started on clindamycin for concerns of tooth infection. For her hypokalemia, this was replaced IV. For her tobacco abuse, smoking cessation was advised.   LABORATORY AND RADIOLOGICAL DATA DURING THIS HOSPITAL COURSE: Chest x-ray was negative. Retic count 3.8. White blood cell count 10.6, hemoglobin and hematocrit 7.6 and 23.5, platelet count 385, glucose 109, BUN 9, creatinine 0.55, sodium 141, potassium 3.4, chloride 110, CO2 19, calcium 8.7. Liver function tests normal. Blood cultures were negative. Urine culture negative.  Urinalysis negative. Hemoglobin on the 31st dipped down to 6.4, was 7.9 on the 1st and 7.9 on the 3rd. Retic count on the 3rd 3.2.   HOSPITAL COURSE PER PROBLEM LIST:  1. For the patient's sickle cell crisis with pain, she was on IV Dilaudid and IV fluids. The patient still had pain on the day of discharge described as 6 out of 10 in intensity but the patient was comfortable. She was switched over to oral medications and wanted to get to the dental appointment the next day because she believes that her sickle cell crisis was brought on by the tooth infection. The patient continued on her hydroxyurea and folic acid. She does have her pain medications at home, Dilaudid as needed. 2. For tooth abscess and poor dentition, [facility]. Clindamycin was given here during the hospital course and upon discharge.  3. For her low-grade fever thought secondary to the tooth infection, blood cultures and urine cultures were negative.  4. For her tobacco abuse, smoking cessation counseling done by Dr. Winona LegatoVaickute on admission. The patient was given a nicotine patch. 5. For her depression, Celexa was continued. 6. For her acid reflux, Nexium and ranitidine continued.   TIME SPENT ON DISCHARGE: 35 minutes.   ____________________________ Herschell Dimesichard J. Renae GlossWieting, MD rjw:drc D: 12/12/2011 15:16:35 ET T: 12/13/2011 10:08:06 ET JOB#: [id number]  cc: Herschell Dimesichard J. Renae GlossWieting, MD, <Dictator> [facility]  Salley ScarletICHARD J Orra Nolde MD ELECTRONICALLY SIGNED 12/23/2011 16:40

## 2015-02-28 NOTE — H&P (Signed)
PATIENT NAME:  [name], [name] MR#:  [id number] DATE OF BIRTH:  1986/10/25  DATE OF ADMISSION:  12/06/2011  PRIMARY CARE PHYSICIAN: [facility]   HISTORY OF PRESENT ILLNESS: The patient is a 29 year old African American female with past medical history significant for history of sickle cell disease who presented to the hospital with complaints of flulike symptoms. According to the patient, she was doing well up until a few days ago when she started having flulike symptoms. She has been coughing, having sinus congestion, having some nausea and has severe pains all over her body. Her temperature is as high as 100.5. She is also having chills and having diarrhea. Because of this discomfort, she decided to come to the Emergency Room for further evaluation. In the Emergency Room, she was afebrile, however, tachycardic with heart rate of 122 and she was very anemic. Hospitalist services were contacted for admission.   PAST MEDICAL HISTORY:  1. History of admission for sickle cell pain crisis in December 2012.  2. History of chronic anemia secondary to sickle cell status post packed red blood cell transfusion in the past.  3. History of leukocytosis which was felt to be of unclear etiology in December admission.  4. Depression. 5. Chronic diarrhea. 6. History of tobacco abuse. 7. Hypertension. 8. Abdominal hernia. 9. Status post splenectomy.  10. Cholecystectomy.  11. Bowel resection. 12. Lung abscess and empyema in 2008.  13. Abdominal hernia repair.  SOCIAL HISTORY: The patient is a chronic smoker, smokes approximately seven cigarettes a day since age of 1. No history of alcoholism. No other drug abuse other than abusing marijuana intermittently according to medical records. The patient is single, has one child. Unemployed. Lives on disability.   FAMILY HISTORY: The patient has cousins on paternal side with sickle cell and also strong family history of diabetes, hypertension from paternal as well  as maternal sides.   MEDICATIONS:  1. Folic acid 1 mg p.o. daily.  2. Nexium 40 mg p.o. daily.  3. Hydroxyurea 1000 mg p.o. daily.  4. Vitamin D with Calcium twice daily. 5. Phenergan as needed. 6. Benadryl as needed.  7. Dilaudid 4 mg p.o. q.4 hours as needed.  8. Celexa 20 mg p.o. daily.  9. Ambien 10 mg p.o. at bedtime. 10. Zyrtec 10 mg p.o. daily.   ALLERGIES: Fentanyl as well as morphine which cause headache as well as itching; Rocephin causes itching as well as hives.  REVIEW OF SYSTEMS: Positive for fevers, fatigue and weakness, pains all over her body, sinus congestion, postnasal drip for which she is using Zyrtec, having problems with sinus congestion since December, some intermittent cough. No significant sputum production. Sometimes she has phlegm which is yellow in color, palpitations, nausea, diarrhea, achiness in all her joints as well as denies any weight loss or gain. In regards to eyes, denies any blurry vision, double vision, glaucoma, or cataracts. ENT: Denies any tinnitus, allergies, epistaxis, sinus pain, dentures, difficulty swallowing. RESPIRATORY: Denies any wheezes, asthma, or chronic obstructive pulmonary disease. CARDIOVASCULAR: Denies chest pains, orthopnea, edema, arrhythmias, or syncope. GASTROINTESTINAL: Denies any vomiting, rectal bleeding, or change in bowel habits. GENITOURINARY: Denies dysuria, hematuria, frequency, or incontinence. ENDOCRINOLOGY: Denies any polydipsia, nocturia, thyroid problems, heat or cold intolerance, or thirst. HEMATOLOGIC: Denies any anemia, easy bruising, bleeding, swollen glands. SKIN: Denies any acne, rash, lesion, or change in moles. MUSCULOSKELETAL: Denies arthritis, cramps, swelling, gout. NEUROLOGIC: Denies numbness, epilepsy, or tremor. PSYCH: Denies anxiety, insomnia, or depression.   PHYSICAL EXAMINATION:  VITAL SIGNS:  On arrival to the hospital, pulse 96, respiration rate 18, blood pressure 139/63, pulse oximetry 100% on room  air. During my evaluation, the patient's heart rate 122, blood pressure 120/74, saturation 100% on room air. Temperature was not measured.   GENERAL: This is a well developed, well nourished African American female in moderate distress. She is anxious and uncomfortable sitting on the stretcher.  HEENT: Pupils equal and reactive to light. Extraocular movements intact. No icterus or conjunctivitis. Has normal hearing. No pharyngeal erythema. Mucosa is moist. She has sinus congestion. She is sniffling and has significant discomfort because of that. She is also tearful. The patient has missing teeth and chipped teeth especially in right upper mandible with some tenderness as well as swelling in her mucosa, gingival swelling and tenderness but no obvious area of pus or fluctuations were noted.   NECK: Neck did not reveal any masses, supple, nontender. Thyroid is not enlarged. No adenopathy. No JVD or carotid bruits bilaterally. Full range of motion.   LUNGS: Clear to auscultation in all fields. Few rhonchi were heard but no rales or wheezing. No labored inspirations, increased effort, dullness to percussion, overt respiratory distress.    CARDIOVASCULAR: S1, S2 appreciated. No murmurs, gallops, or rubs noted. PMI not lateralized. Chest is nontender to palpation.   EXTREMITIES: 1+ pedal pulses. No lower extremity edema, calf tenderness, or cyanosis.   ABDOMEN: Soft, nontender. Bowel sounds are present. No hepatosplenomegaly or masses were noted.   RECTAL: Deferred.   MUSCULOSKELETAL: Muscle strength able to move all extremities. No cyanosis, degenerative joint disease, or kyphosis. Gait is not tested.   SKIN: Skin did not reveal any rashes, lesions, erythema, nodularity, or induration. It was warm and dry to palpation.   LYMPH: No adenopathy in the cervical region.   NEUROLOGICAL: Cranial nerves grossly intact. Sensory is intact. No dysarthria or aphasia. The patient is alert and oriented to time,  person, place, cooperative. Memory is good.   PSYCHIATRIC: No significant confusion, agitation, or depression was noted.   LABORATORY, DIAGNOSTIC, AND RADIOLOGICAL DATA: EKG not done. BMP showed glucose 109, potassium 3.4, bicarbonate 19, otherwise unremarkable BMP. Liver enzymes were unremarkable. White blood cell count is normal at 10.6, hemoglobin 7.6, platelet count 385. Reticulocyte count is elevated at 3.8. Absolute reticulocyte count is elevated at 0.088. Chest x-ray, PA and lateral, 12/06/2011 no acute abnormality noted.   ASSESSMENT AND PLAN:  1. Sickle cell pain crisis. Admit patient to medical floor. Continue IV fluids as well as pain medications. Follow her clinically.  2. Fever. Get blood cultures, urine culture, as well as sputum cultures. Will start patient on  Clindamycin p.o. due to concerns of tooth infection.  3. Tooth infection. As above, we will continue Clindamycin. Will follow her clinically. 4. Hypokalemia. Supplement IV.  5. Sickle cell anemia, chronic. Will follow the patient's hemoglobin along. Will transfuse as necessary.  6. Diarrhea. Get stool cultures.  7. History of tobacco abuse. Discussed cessation for four minutes. Will initiate tobacco replacement therapy.   TIME SPENT: One hour.   ____________________________ Katharina Caperima Honestii Marton, MD rv:drc D: 12/06/2011 08:54:40 ET T: 12/06/2011 09:37:48 ET JOB#: [id number]  cc: Katharina Caperima Damarious Holtsclaw, MD, <Dictator> [facility]  Tamelia Michalowski MD ELECTRONICALLY SIGNED 12/17/2011 20:25

## 2015-02-28 NOTE — Consult Note (Signed)
PATIENT NAME:  [name], [name] MR#:  [id number] DATE OF BIRTH:  1986-11-05  DATE OF CONSULTATION:  01/25/2012  REFERRING PHYSICIAN:   CONSULTING PHYSICIAN:  Annice Needy, MD  REASON FOR CONSULTATION: Evaluation of Port-A-Cath.  HISTORY OF PRESENT ILLNESS: This is a 29 year old African American female with past medical history significant for sickle cell disease who is admitted to the hospital with pain and nausea and vomiting. Apparently her port was working yesterday, however, overnight it has become nonfunctional and we are asked to evaluate her port. This is an interesting port that was actually placed in her left arm, which is a situation which I have not previously seen.  When asked why she said she had too many PICC lines in her chest, which does not exactly make sense, but this was done in [place] four years ago.  I do not have these records.  She still complains of pain and multiple somatic complaints, as well as being quite agitated.   PAST MEDICAL HISTORY:  1. Sickle cell disease. 2. Chronic anemia with frequent blood cell transfusions. 3. Leukocytosis. 4. Depression. 5. Tobacco dependence. 6. Hypertension. 7. Abscessed tooth.  PAST SURGICAL HISTORY:  1. Extensive abdominal surgery with splenectomy, cholecystectomy, bowel resection, and abdominal hernia repairs. 2. Lung abscess with empyema.  SOCIAL HISTORY: Apparently chronic tobacco use, although she may have quit in the last week or so and intermittent marijuana use.  FAMILY HISTORY: She has multiple family members with sickle cell disease.  HOME MEDICATIONS:  1. Folic acid 1 mg daily. 2. Nexium 40 mg daily. 3. Hydroxyurea 1 gram daily. 4. Vitamin D with calcium twice a day. 5. Phenergan as needed. 6. Benadryl as needed. 7. Dilaudid 4 mg every 4 hours p.r.n.  8. Celexa 20 mg daily. 9. Ambien 10 mg at bedtime. 10. Zyrtec 10 mg daily. 11. Nicotine patch daily.  DRUG ALLERGIES: Fentanyl, morphine, and Rocephin.    REVIEW OF SYSTEMS: GENERAL: Positive for mildly elevated temperature, but no frank fever, as well as fatigue and weakness. EYES: No blurred or double vision. EARS: No tinnitus or ear pain. CARDIAC: No chest pain or palpitations. RESPIRATORY: No shortness of breath or cough. GASTROINTESTINAL: Positive for abdominal pain. GENITOURINARY: No dysuria or hematuria. ENDOCRINE: No heat or cold intolerance. PSYCH: Positive for chronic pain. NEURO: No TIA, stroke, or seizure. SKIN: No new rashes or ulcers.   PHYSICAL EXAMINATION:  GENERAL: This is a 29 year old African American female who is somewhat agitated and seen down in the special procedures area prior to her port injection.   VITALS: Temperature 97.2, pulse 103, blood pressure 108/64, saturation 98% on room air.  HEAD: Normocephalic, atraumatic.   EYES: Sclerae nonicteric. Conjunctivae are clear.   MOUTH: Dentition is poor.   NECK: Supple without adenopathy or JVD.   HEART: Tachycardic and regular.  LUNGS: Clear bilaterally.  EXTREMITIES: She has a port in her left inner arm, somewhat of an interesting location that I have not previously used, but it has been working for four years. The initial evaluation has the [facility] needle in place with a large bulky dressing about it. Lower extremities have no cyanosis, clubbing, or edema.   PSYCH: Somewhat agitated and irritated, but alert and oriented.   LABS/STUDIES: Sodium 144, potassium 4.3, chloride 117, CO2 17, BUN 7, creatinine 0.68, glucose 72. White blood cell count 11.4, hemoglobin 7.1, platelet count 226,000.   ASSESSMENT AND PLAN: This is a 29 year old African American female with sickle cell disease. We will do a  port injection to see if this is a functional port.   This is a level-3 consultation.  ____________________________ Annice NeedyJason S. Alejandro Adcox, MD jsd:slb D: 01/25/2012 15:38:55 ET T: 01/25/2012 17:01:24 ET JOB#: [id number]  cc: Annice NeedyJason S. Evangela Heffler, MD, <Dictator> Annice NeedyJASON S Dionel Archey  MD ELECTRONICALLY SIGNED 01/26/2012 17:19

## 2015-11-08 NOTE — Nursing Note (Signed)
Nursing Discharge Summary - Text       Nursing Discharge Summary Entered On:  11/08/2015 8:09 EST    Performed On:  11/08/2015 8:09 EST by Marrion CoyALICEA, RN, LARA J               DC Information   Discharge To, Anticipated :   Home with family support   Mode of Discharge :   Ambulatory   Transportation :   Private vehicle   PenderALICEA, [place]RN, Kathe BectonLARA J - 11/08/2015 8:09 EST

## 2018-11-03 NOTE — Discharge Summary (Signed)
Pulse Rate PULSE RATE_1%> PULSE RATE%>   Apical Heart Rate HEART RATE_1%> HEART RATE%>   Blood Pressure BLOOD PRESSURE_1%>/ BLOOD PRESSURE_1%>76 mmHg BLOOD PRESSURE%> / BLOOD PRESSURE%>76 mmHg                 Immunizations      No Immunizations Documented This Visit          DISCHARGE INFORMATION   Discharge Disposition: H Outpt-Sent Home   Discharge Location:  Home   Discharge Date and Time:  11/03/2018 23:12:37   ED Checkout Date and Time:  11/03/2018 23:12:37     DEPART REASON INCOMPLETE INFORMATION               Depart Action Incomplete Reason   Interactive View/I&O Recently assessed               Problems      Active           Hypertension          Sickle cell              Smoking Status      Former smoker         PATIENT EDUCATION INFORMATION  Instructions:     Sickle Cell Anemia, Adult; Work Excuse 2d Delton Coombes [phone]) [phone])     Follow up:                   With: Address: When:   Follow up with primary care provider  Within 1 to 2 days   Comments:   Return to ED if  symptoms worsen              ED PROVIDER DOCUMENTATION     Patient:   Judith [name], Judith [name]            MRN: [id number]            FIN: [id number]               Age:   32 years     Sex:  Female     DOB:  1986-02-10   Associated Diagnoses:   Sickle cell crisis   Author:   Leana Gamer      Basic Information   Time seen: Provider Seen (ST)   ED Provider/Time:    GREAVES-MD,  ROBERT CLIVE / 11/03/2018 20:41  .   Additional information: Chief Complaint from Nursing Triage Note   Chief Complaint  Chief Complaint: pt c/o "sickle cell crisis x 4 days."  c/o chest pain, back pain, neck pain, bilat hip pain and sob. states "this is my normal pain symptoms when I'm in a crisis." (11/03/18 20:34:00).      History of Present Illness   The patient presents with sickle cell pain.  The onset was 4  days ago.  The course/duration of symptoms is constant and worsening.  The character of symptoms is "pain".  The degree of pain is moderate.  The exacerbating factor is none.  Prior episodes: similar to "sickle cell pain" and occasional.  Associated symptoms: denies vomiting, denies fever, denies headache and denies dizziness.  32 year old female presents with "typical" sickle cell pain x4 days.  Her baseline hemoglobin is 7 she was last transfused about 1 month ago.  Patient believes she has a very mild URI and weather change as a trigger for her current symptoms.  She complains of chest back neck bilateral hip pain and mild shortness of breath.  ED Clinical Summary                     [facility]  [address]  [place], [place] [address]  [phone]          PERSON INFORMATION  Name: Judith [name] Age:  25 Years DOB: 03/16/1986   Sex: Female Language: English PCP: PCP,  NONE   Marital Status: Single Phone: [phone] Med Service: MED-Medicine   MRN: [id number] Acct# [id number] Arrival: 11/03/2018 20:32:00   Visit Reason: Sickle cell pain; SICKLE CELL CRISIS Acuity: 3 LOS: 000 02:40   Address:    [address] [place] [address]   Diagnosis:    Other abnormalities of breathing; Sickle cell crisis  Medications:          Medications That Were Updated - Follow Current Instructions  Printed Prescriptions  Current promethazine (promethazine 25 mg oral tablet) 1 Tabs Oral (given by mouth) 3 times a day as needed nausea/vomiting. Refills: 0.  Last Dose:____________________    Other Medications  Current promethazine (Phenergan)   Last Dose:____________________    Medications that have not changed  Other Medications  citalopram (CeleXA) Oral (given by mouth) every day.  Last Dose:____________________  clonazePAM (KlonoPIN) Oral (given by mouth) 3 times a day.  Last Dose:____________________  folic acid every day.  Last Dose:____________________  ibuprofen   Last Dose:____________________  mirtazapine (mirtazapine 30 mg oral tablet) 1 Tabs Oral (given by mouth) Once a Day (at bedtime).  Last Dose:____________________  ondansetron (Zofran)   Last Dose:____________________  oxyCODONE Oral (given by mouth).  Last Dose:____________________  sodium bicarbonate (sodium bicarbonate oral) Oral (given by mouth) 4 times a day.  Last Dose:____________________      Medications Administered During Visit:                Medication Dose Route   Sodium Chloride 0.9% 1000 mL IV Piggyback   hydromorphone 1 mg IV Push   diphenhydrAMINE 25 mg IV Push   ketorolac 15 mg IV Push   promethazine 12.5 mg IV Push   hydromorphone 2 mg IV Push    diphenhydrAMINE 25 mg IV Push   heparin flush 500 units Intracath               Allergies      Rocephin      fentaNYL      morphine      contrast media (iodine-based)      Major Tests and Procedures:  The following procedures and tests were performed during your ED visit.  COMMON PROCEDURES%>  COMMON PROCEDURES COMMENTS%>                PROVIDER INFORMATION               Provider Role Assigned Melanee Left [facility] ED Provider 11/03/2018 20:41:28    Jeanella Flattery, RN, Janetta Hora ED Nurse 11/03/2018 20:54:21        Attending Physician:  Willeen Cass CLIVE      Admit Doc  GREAVES-MD,  ROBERT CLIVE     Consulting Doc       VITALS INFORMATION  Vital Sign Triage Latest   Temp Oral ORAL_1%> ORAL%>   Temp Temporal TEMPORAL_1%> TEMPORAL%>   Temp Intravascular INTRAVASCULAR_1%> INTRAVASCULAR%>   Temp Axillary AXILLARY_1%> AXILLARY%>   Temp Rectal RECTAL_1%> RECTAL%>   02 Sat 100 % 100 %   Respiratory Rate RATE_1%> RATE%>   Peripheral  ED Clinical Summary                     [facility]  [address]  [place], [place] [address]  [phone]          PERSON INFORMATION  Name: Judith [name] Age:  25 Years DOB: 03/16/1986   Sex: Female Language: English PCP: PCP,  NONE   Marital Status: Single Phone: [phone] Med Service: MED-Medicine   MRN: [id number] Acct# [id number] Arrival: 11/03/2018 20:32:00   Visit Reason: Sickle cell pain; SICKLE CELL CRISIS Acuity: 3 LOS: 000 02:40   Address:    [address] [place] [address]   Diagnosis:    Other abnormalities of breathing; Sickle cell crisis  Medications:          Medications That Were Updated - Follow Current Instructions  Printed Prescriptions  Current promethazine (promethazine 25 mg oral tablet) 1 Tabs Oral (given by mouth) 3 times a day as needed nausea/vomiting. Refills: 0.  Last Dose:____________________    Other Medications  Current promethazine (Phenergan)   Last Dose:____________________    Medications that have not changed  Other Medications  citalopram (CeleXA) Oral (given by mouth) every day.  Last Dose:____________________  clonazePAM (KlonoPIN) Oral (given by mouth) 3 times a day.  Last Dose:____________________  folic acid every day.  Last Dose:____________________  ibuprofen   Last Dose:____________________  mirtazapine (mirtazapine 30 mg oral tablet) 1 Tabs Oral (given by mouth) Once a Day (at bedtime).  Last Dose:____________________  ondansetron (Zofran)   Last Dose:____________________  oxyCODONE Oral (given by mouth).  Last Dose:____________________  sodium bicarbonate (sodium bicarbonate oral) Oral (given by mouth) 4 times a day.  Last Dose:____________________      Medications Administered During Visit:                Medication Dose Route   Sodium Chloride 0.9% 1000 mL IV Piggyback   hydromorphone 1 mg IV Push   diphenhydrAMINE 25 mg IV Push   ketorolac 15 mg IV Push   promethazine 12.5 mg IV Push   hydromorphone 2 mg IV Push    diphenhydrAMINE 25 mg IV Push   heparin flush 500 units Intracath               Allergies      Rocephin      fentaNYL      morphine      contrast media (iodine-based)      Major Tests and Procedures:  The following procedures and tests were performed during your ED visit.  COMMON PROCEDURES%>  COMMON PROCEDURES COMMENTS%>                PROVIDER INFORMATION               Provider Role Assigned Melanee Left [facility] ED Provider 11/03/2018 20:41:28    Jeanella Flattery, RN, Janetta Hora ED Nurse 11/03/2018 20:54:21        Attending Physician:  Willeen Cass CLIVE      Admit Doc  GREAVES-MD,  ROBERT CLIVE     Consulting Doc       VITALS INFORMATION  Vital Sign Triage Latest   Temp Oral ORAL_1%> ORAL%>   Temp Temporal TEMPORAL_1%> TEMPORAL%>   Temp Intravascular INTRAVASCULAR_1%> INTRAVASCULAR%>   Temp Axillary AXILLARY_1%> AXILLARY%>   Temp Rectal RECTAL_1%> RECTAL%>   02 Sat 100 % 100 %   Respiratory Rate RATE_1%> RATE%>   Peripheral  Pulse Rate PULSE RATE_1%> PULSE RATE%>   Apical Heart Rate HEART RATE_1%> HEART RATE%>   Blood Pressure BLOOD PRESSURE_1%>/ BLOOD PRESSURE_1%>76 mmHg BLOOD PRESSURE%> / BLOOD PRESSURE%>76 mmHg                 Immunizations      No Immunizations Documented This Visit          DISCHARGE INFORMATION   Discharge Disposition: H Outpt-Sent Home   Discharge Location:  Home   Discharge Date and Time:  11/03/2018 23:12:37   ED Checkout Date and Time:  11/03/2018 23:12:37     DEPART REASON INCOMPLETE INFORMATION               Depart Action Incomplete Reason   Interactive View/I&O Recently assessed               Problems      Active           Hypertension          Sickle cell              Smoking Status      Former smoker         PATIENT EDUCATION INFORMATION  Instructions:     Sickle Cell Anemia, Adult; Work Excuse 2d Delton Coombes [phone]) [phone])     Follow up:                   With: Address: When:   Follow up with primary care provider  Within 1 to 2 days   Comments:   Return to ED if  symptoms worsen              ED PROVIDER DOCUMENTATION     Patient:   Judith [name], Judith [name]            MRN: [id number]            FIN: [id number]               Age:   32 years     Sex:  Female     DOB:  1986-02-10   Associated Diagnoses:   Sickle cell crisis   Author:   Leana Gamer      Basic Information   Time seen: Provider Seen (ST)   ED Provider/Time:    GREAVES-MD,  ROBERT CLIVE / 11/03/2018 20:41  .   Additional information: Chief Complaint from Nursing Triage Note   Chief Complaint  Chief Complaint: pt c/o "sickle cell crisis x 4 days."  c/o chest pain, back pain, neck pain, bilat hip pain and sob. states "this is my normal pain symptoms when I'm in a crisis." (11/03/18 20:34:00).      History of Present Illness   The patient presents with sickle cell pain.  The onset was 4  days ago.  The course/duration of symptoms is constant and worsening.  The character of symptoms is "pain".  The degree of pain is moderate.  The exacerbating factor is none.  Prior episodes: similar to "sickle cell pain" and occasional.  Associated symptoms: denies vomiting, denies fever, denies headache and denies dizziness.  32 year old female presents with "typical" sickle cell pain x4 days.  Her baseline hemoglobin is 7 she was last transfused about 1 month ago.  Patient believes she has a very mild URI and weather change as a trigger for her current symptoms.  She complains of chest back neck bilateral hip pain and mild shortness of breath.  Pulse Rate PULSE RATE_1%> PULSE RATE%>   Apical Heart Rate HEART RATE_1%> HEART RATE%>   Blood Pressure BLOOD PRESSURE_1%>/ BLOOD PRESSURE_1%>76 mmHg BLOOD PRESSURE%> / BLOOD PRESSURE%>76 mmHg                 Immunizations      No Immunizations Documented This Visit          DISCHARGE INFORMATION   Discharge Disposition: H Outpt-Sent Home   Discharge Location:  Home   Discharge Date and Time:  11/03/2018 23:12:37   ED Checkout Date and Time:  11/03/2018 23:12:37     DEPART REASON INCOMPLETE INFORMATION               Depart Action Incomplete Reason   Interactive View/I&O Recently assessed               Problems      Active           Hypertension          Sickle cell              Smoking Status      Former smoker         PATIENT EDUCATION INFORMATION  Instructions:     Sickle Cell Anemia, Adult; Work Excuse 2d Delton Co[place]es [phone]) [phone])     Follow up:                   With: Address: When:   Follow up with primary care provider  Within 1 to 2 days   Comments:   Return to ED if  symptoms worsen              ED PROVIDER DOCUMENTATION     Patient:   Judith [name], Judith [name]            MRN: [id number]            FIN: [id number]               Age:   32 years     Sex:  Female     DOB:  1986-02-10   Associated Diagnoses:   Sickle cell crisis   Author:   Leana Gamer      Basic Information   Time seen: Provider Seen (ST)   ED Provider/Time:    GREAVES-MD,  ROBERT CLIVE / 11/03/2018 20:41  .   Additional information: Chief Complaint from Nursing Triage Note   Chief Complaint  Chief Complaint: pt c/o "sickle cell crisis x 4 days."  c/o chest pain, back pain, neck pain, bilat hip pain and sob. states "this is my normal pain symptoms when I'm in a crisis." (11/03/18 20:34:00).      History of Present Illness   The patient presents with sickle cell pain.  The onset was 4  days ago.  The course/duration of symptoms is constant and worsening.  The character of symptoms is "pain".  The degree of pain is moderate.  The exacerbating factor is none.  Prior episodes: similar to "sickle cell pain" and occasional.  Associated symptoms: denies vomiting, denies fever, denies headache and denies dizziness.  32 year old female presents with "typical" sickle cell pain x4 days.  Her baseline hemoglobin is 7 she was last transfused about 1 month ago.  Patient believes she has a very mild URI and weather change as a trigger for her current symptoms.  She complains of chest back neck bilateral hip pain and mild shortness of breath.

## 2018-11-03 NOTE — ED Notes (Signed)
ED Triage Note       ED Secondary Triage Entered On:  11/03/2018 20:39 EST    Performed On:  11/03/2018 20:38 EST by Willette Cluster               General Information   Barriers to Learning :   None evident   Languages :   English   Patient received chemo or biotherapy last 48 hrs? :   No   Influenza Vaccine Status :   Received prior to admission, during current flu season   Pt. Currently Receiving Radiation :   No   ED Home Meds Section :   Document assessment   [facility] ED Fall Risk Section :   Document assessment   ED History Section :   Document assessment   Infectious Disease Documentation :   Document assessment   ED Advance Directives Section :   Document assessment   Willette Cluster - 11/03/2018 20:38 EST   (As Of: 11/03/2018 20:39:15 EST)   Problems(Active)    Hypertension (IMO  :91478 )  Name of Problem:   Hypertension ; Recorder:   Rock Nephew, RN, JENNETTE; Confirmation:   Confirmed ; Classification:   Medical ; Code:   [id number] ; Contributor System:   Dietitian ; Last Updated:   11/08/2015 5:36 EST ; Life Cycle Date:   11/08/2015 ; Life Cycle Status:   Active ; Vocabulary:   IMO        Sickle cell (SNOMED CT  :13086578 )  Name of Problem:   Sickle cell ; Recorder:   ALBAYALDE, RN, JENNETTE; Confirmation:   Confirmed ; Classification:   Medical ; Code:   [id number] ; Contributor System:   Dietitian ; Last Updated:   11/08/2015 5:36 EST ; Life Cycle Date:   11/08/2015 ; Life Cycle Status:   Active ; Vocabulary:   SNOMED CT          Diagnoses(Active)    Sickle cell pain  Date:   11/03/2018 ; Diagnosis Type:   Reason For Visit ; Confirmation:   Complaint of ; Clinical Dx:   Sickle cell pain ; Classification:   Medical ; Clinical Service:   Non-Specified ; Code:   PNED ; Probability:   0 ; Diagnosis Code:   [id number]             -    Procedure History   (As Of: 11/03/2018 20:39:15 EST)     Anesthesia Minutes:   0 ; Procedure Name:   Port ; Procedure Minutes:   0            [facility]  Fall Risk Assessment Tool   Hx of falling last 3 months ED Fall :   No   Patient confused or disoriented ED Fall :   No   Patient intoxicated or sedated ED Fall :   No   Patient impaired gait ED Fall :   No   Use a mobility assistance device ED Fall :   No   Patient altered elimination ED Fall :   No   [facility] ED Fall Score :   0    Willette Cluster - 11/03/2018 20:38 EST   ED Advance Directive   Advance Directive :   No   Willette Cluster - 11/03/2018 20:38 EST   Social History   Social History   (As Of: 11/03/2018 20:39:15 EST)   Tobacco:        Former smoker   (  Last Updated: 11/08/2015 05:40:04 EST by Rock Nephew, RN, JENNETTE)            ID Risk Screen Symptoms   Recent Travel History :   No recent travel   TB Symptom Screen :   No symptoms   C. diff Symptom/History ID :   Neither of the above   Willette Cluster - 11/03/2018 20:38 EST   Med Hx   Medication List   (As Of: 11/03/2018 20:39:15 EST)   Home Meds    citalopram  :   citalopram ; Status:   Documented ; Ordered As Mnemonic:   CeleXA ; Simple Display Line:   Oral, Daily, 0 Refill(s) ; Catalog Code:   citalopram ; Order Dt/Tm:   09/15/2017 03:00:10 EST          mirtazapine  :   mirtazapine ; Status:   Documented ; Ordered As Mnemonic:   mirtazapine 30 mg oral tablet ; Simple Display Line:   30 mg, 1 tabs, Oral, Once a Day (at bedtime), 30 tabs, 0 Refill(s) ; Catalog Code:   mirtazapine ; Order Dt/Tm:   09/15/2017 03:00:13 EST          clonazePAM  :   clonazePAM ; Status:   Documented ; Ordered As Mnemonic:   KlonoPIN ; Simple Display Line:   Oral, TID, 0 Refill(s) ; Catalog Code:   clonazePAM ; Order Dt/Tm:   11/08/2015 05:37:43 EST          ibuprofen  :   ibuprofen ; Status:   Documented ; Ordered As Mnemonic:   ibuprofen ; Simple Display Line:   0 Refill(s) ; Catalog Code:   ibuprofen ; Order Dt/Tm:   11/08/2015 05:37:49 EST          ondansetron  :   ondansetron ; Status:   Documented ; Ordered As Mnemonic:   Zofran ; Simple Display Line:   0  Refill(s) ; Catalog Code:   ondansetron ; Order Dt/Tm:   11/08/2015 05:37:31 EST          oxyCODONE  :   oxyCODONE ; Status:   Documented ; Ordered As Mnemonic:   oxyCODONE ; Simple Display Line:   Oral, 0 Refill(s) ; Catalog Code:   oxyCODONE ; Order Dt/Tm:   11/08/2015 05:37:37 EST          promethazine  :   promethazine ; Status:   Documented ; Ordered As Mnemonic:   Phenergan ; Simple Display Line:   0 Refill(s) ; Catalog Code:   promethazine ; Order Dt/Tm:   11/08/2015 05:37:14 EST          sodium bicarbonate  :   sodium bicarbonate ; Status:   Documented ; Ordered As Mnemonic:   sodium bicarbonate oral ; Simple Display Line:   mg, Oral, QID, 0 Refill(s) ; Catalog Code:   sodium bicarbonate ; Order Dt/Tm:   11/08/2015 05:37:07 EST          folic acid  :   folic acid ; Status:   Documented ; Ordered As Mnemonic:   folic acid ; Simple Display Line:   Daily, 0 Refill(s) ; Catalog Code:   folic acid ; Order Dt/Tm:   11/08/2015 05:36:58 EST

## 2018-11-03 NOTE — Nursing Note (Signed)
Medication Administration Follow Up-Text       Medication Administration Follow Up Entered On:  11/03/2018 22:30 EST    Performed On:  11/03/2018 22:30 EST by Tresa EndoKelly, RN, Rex KrasEdward J      Intervention Information:     hydromorphone  Performed by Dorris CarnesShields, RN, Vonzell SchlatterAshley H on 11/03/2018 21:19:00 EST       hydromorphone,1mg   IV Push,Chest, Anterior Left       Med Response   ED Medication Response :   No adverse reaction, Symptoms improved   Numeric Rating Pain Scale :   7   [place]ero Opioid Induced Sedation Scale :   1 = Awake and alert   Dwaine GaleKelly, RN, Edward J - 11/03/2018 22:30 EST

## 2018-11-03 NOTE — ED Notes (Signed)
 ED Patient Summary       ;      [facility] Emergency Department  [address] [place] [address]  [phone]  Discharge Instructions (Patient)  _______________________________________    Name: Judith [name], Judith [name]  DOB: 1986-11-03 MRN: [id number] FIN: [id number]  Reason For Visit: Sickle cell pain; SICKLE CELL CRISIS  Final Diagnosis: Other abnormalities of breathing; Sickle cell crisis    Visit Date: 11/03/2018 20:32:00  Address: [address] [place] [address]  Phone: [phone]    Primary Care Provider:  Name: PCP,  NONE  Phone:      Emergency Department Providers:         Primary Physician:   BENJAMEN LAMAR ANTHONY Shelvy Gwenn Lionel would like to thank you for allowing us  to assist you with your healthcare needs. The following includes patient education materials and information regarding your injury/illness.    Follow-up Instructions: You were treated today on an emergency basis, it may be wise to contact your primary care provider to notify them of your visit today. You may have been referred to your regular doctor or a specialist, please follow up as instructed. If your condition worsens or you can't get in to see the doctor, contact the Emergency Department.              With: Address: When:   Follow up with primary care provider  Within 1 to 2 days   Comments:   Return to ED if symptoms worsen             Patient Education Materials:  Sickle Cell Anemia, Adult; Work Excuse 2d Reuben [phone]) [phone])     Sickle Cell Anemia, Adult    Sickle cell anemia is a condition in which red blood cells have an abnormal sickle shape. This abnormal shape shortens the cells' life span, which results in a lower than normal concentration of red blood cells in the blood. The sickle shape also causes the cells to clump together and block free blood flow through the blood vessels. As a result, the tissues and organs of the body do not receive enough oxygen. Sickle cell anemia  causes organ damage and pain and increases the risk of infection.      CAUSES    Sickle cell anemia is a genetic disorder. Those who receive two copies of the gene have the condition, and those who receive one copy have the trait.    RISK FACTORS    The sickle cell gene is most common in people whose families originated in [place]. Other areas of the globe where sickle cell trait occurs include the [place], [place] and [place], the [place], and the [place].     SIGNS AND SYMPTOMS     Pain, especially in the extremities, back, chest, or abdomen (common). The pain may start suddenly or may develop following an illness, especially if there is dehydration. Pain can also occur due to overexertion or exposure to extreme temperature changes.     Frequent severe bacterial infections, especially certain types of pneumonia and meningitis.     Pain and swelling in the hands and feet.     Decreased activity. ?     Loss of appetite. ?     Change in behavior.      Headaches.     Seizures.     Shortness of breath or difficulty breathing.     Vision changes.  Skin ulcers.    Those with the trait may not have symptoms or they may have mild symptoms.     DIAGNOSIS    Sickle cell anemia is diagnosed with blood tests that demonstrate the genetic trait. It is often diagnosed during the newborn period, due to mandatory testing nationwide. A variety of blood tests, X-rays, CT scans, MRI scans, ultrasounds, and lung function tests may also be done to monitor the condition.    TREATMENT    Sickle cell anemia may be treated with:     Medicines. You may be given pain medicines, antibiotic medicines (to treat and prevent infections) or medicines to increase the production of certain types of hemoglobin.     Fluids.     Oxygen.     Blood transfusions.    HOME CARE INSTRUCTIONS     Drink enough fluid to keep your urine clear or pale yellow. Increase your fluid intake in hot weather and during exercise.      Do not smoke.  Smoking lowers oxygen levels in the blood. ?     Only take over-the-counter or prescription medicines for pain, fever, or discomfort as directed by your health care provider.     Take antibiotics as directed by your health care provider. Make sure you finish them it even if you start to feel better. ?     Take supplements as directed by your health care provider. ?     Consider wearing a medical alert bracelet. This tells anyone caring for you in an emergency of your condition. ?     When traveling, keep your medical information, health care provider's names, and the medicines you take with you at all times. ?     If you develop a fever, do not take medicines to reduce the fever right away. This could cover up a problem that is developing. Notify your health care provider.      Keep all follow-up appointments with your health care provider. Sickle cell anemia requires regular medical care.     SEEK MEDICAL CARE IF:    ?You have a fever.    SEEK IMMEDIATE MEDICAL CARE IF:     You feel dizzy or faint. ?     You have new abdominal pain, especially on the left side near the stomach area. ?     You develop a persistent, often uncomfortable and painful penile erection (priapism). If this is not treated immediately it will lead to impotence. ?     You have numbness your arms or legs or you have a hard time moving them. ?     You have a hard time with speech. ?     You have a fever or persistent symptoms for more than 2?3 days. ?     You have a fever and your symptoms suddenly get worse. ?     You have signs or symptoms of infection. These include: ?    ? Chills. ?    ? Abnormal tiredness (lethargy). ?    ? Irritability. ?    ? Poor eating. ?    ? Vomiting. ?     You develop pain that is not helped with medicine. ?     You develop shortness of breath.     You have pain in your chest. ?     You are coughing up pus-like or bloody sputum. ?     You develop a stiff neck.     Your feet  or hands swell or have pain.     Your  abdomen appears bloated.     You develop joint pain.    MAKE SURE YOU:     Understand these instructions.    This information is not intended to replace advice given to you by your health care provider. Make sure you discuss any questions you have with your health care provider.    Document Released: 01/31/2006 Document Revised: 11/13/2014 Document Reviewed: 06/04/2013  Elsevier Interactive Patient Education ?2016 [employer]               [facility]  Emergency Department    Please Excuse from Work, School, or Physical Activity    Patient Name:    ** See bottom of page for name and date**        was seen in the Emergency Department today.    Please excuse from work/school  today and next 2 days.    Thank you for your consideration regarding your employee.    Health Care Provider Name (printed):    Lamar BROCKS. Reuben, MD      Health Care Provider (signature): ___________________________________________    This information is not intended to replace advice given to you by your health care provider. Make sure you discuss any questions you have with your health care provider.                        Allergy Info: fentaNYL; contrast media (iodine-based); Rocephin; morphine    Medication Information:  [facility] provided you with a complete list of medications post discharge, if you have been instructed to stop taking a medication please ensure you also follow up with this information to your Primary Care Physician. Unless otherwise noted, patient will continue to take medications as prescribed prior to the Emergency Room visit. Any specific questions regarding your chronic medications and dosages should be discussed with your physician(s) and pharmacist.          Medications That Were Updated - Follow Current Instructions  Printed Prescriptions  Current promethazine (promethazine 25 mg oral tablet) 1 Tabs Oral (given by mouth) 3 times a day as needed nausea/vomiting. Refills: 0.  Last  Dose:____________________    Other Medications  Current promethazine (Phenergan)   Last Dose:____________________    Medications that have not changed  Other Medications  citalopram (CeleXA) Oral (given by mouth) every day.  Last Dose:____________________  clonazePAM (KlonoPIN) Oral (given by mouth) 3 times a day.  Last Dose:____________________  folic acid every day.  Last Dose:____________________  ibuprofen   Last Dose:____________________  mirtazapine (mirtazapine 30 mg oral tablet) 1 Tabs Oral (given by mouth) Once a Day (at bedtime).  Last Dose:____________________  ondansetron (Zofran)   Last Dose:____________________  oxyCODONE Oral (given by mouth).  Last Dose:____________________  sodium bicarbonate (sodium bicarbonate oral) Oral (given by mouth) 4 times a day.  Last Dose:____________________      Medications Administered During Visit:              Medication Dose Route   Sodium Chloride 0.9% 1000 mL IV Piggyback   hydromorphone 1 mg IV Push   diphenhydrAMINE 25 mg IV Push   ketorolac 15 mg IV Push   promethazine 12.5 mg IV Push   hydromorphone 2 mg IV Push   diphenhydrAMINE 25 mg IV Push   heparin flush 500 units Intracath         Major Tests and  Procedures:  The following procedures and tests were performed during your ED visit.  PROCEDURES%>  PROCEDURES COMMENTS%>          ---------------------------------------------------------------------------------------------------------------------  [facility] allows you to manage your health, view your test results, and retrieve your discharge documents from your hospital stay securely and conveniently from your computer.    To begin the enrollment process, visit [email or web address] Click on "Sign up now" under [facility].      Comment:

## 2018-11-03 NOTE — Nursing Note (Signed)
Medication Administration Follow Up-Text       Medication Administration Follow Up Entered On:  11/03/2018 22:31 EST    Performed On:  11/03/2018 22:30 EST by Tresa EndoKelly, RN, Rex KrasEdward J      Intervention Information:     ketorolac  Performed by Dorris CarnesShields, RN, Vonzell SchlatterAshley H on 11/03/2018 21:19:00 EST       ketorolac,15mg   IV Push,Chest, Anterior Left       Med Response   ED Medication Response :   No adverse reaction   Numeric Rating Pain Scale :   7   [place] Opioid Induced Sedation Scale :   1 = Awake and alert   Dwaine GaleKelly, RN, Edward J - 11/03/2018 22:30 EST

## 2018-11-03 NOTE — ED Notes (Signed)
 ED Triage Note       ED Triage Adult Entered On:  11/03/2018 20:38 EST    Performed On:  11/03/2018 20:34 EST by Gerhard Patron               Triage   Chief Complaint :   pt c/o sickle cell crisis x 4 days.  c/o chest pain, back pain, neck pain, bilat hip pain and sob. states this is my normal pain symptoms when I'm in a crisis.    Numeric Rating Pain Scale :   8   [place] Mode of Arrival :   Private vehicle   Temperature Oral :   37.1 degC(Converted to: 98.8 degF)    Heart Rate Monitored :   100 bpm   Respiratory Rate :   16 br/min   Systolic Blood Pressure :   130 mmHg   Diastolic Blood Pressure :   76 mmHg   SpO2 :   100 %   Oxygen Therapy :   Room air   Patient presentation :   None of the above   Chief Complaint or Presentation suggest infection :   No   Dosing Weight Obtained By :   Patient stated   Weight Dosing :   53.9 kg(Converted to: 118 lb 13 oz)    Height :   160 cm(Converted to: 5 ft 3 in)    Body Mass Index Dosing :   21 kg/m2   ED General Section :   Document assessment   Pregnancy Status :   Patient denies   Gerhard Patron - 11/03/2018 20:34 EST   DCP GENERIC CODE   Tracking Acuity :   3   Tracking Group :   [id number] [employer]   Gerhard Patron - 11/03/2018 20:34 EST   Approximate Last Menstrual Period :   depot   ED Allergies Section :   Document assessment   ED Reason for Visit Section :   Document assessment   ED Quick Assessment :   Patient appears awake, alert, oriented to baseline. Skin warm and dry. Moves all extremities. Respiration even and unlabored. Appears in no apparent distress.   Gerhard Patron - 11/03/2018 20:34 EST   Allergies   (As Of: 11/03/2018 20:38:23 EST)   Allergies (Active)   contrast media (iodine-based)  Estimated Onset Date:   Unspecified ; Created By:   CHARLIES RN, JENNETTE; Reaction Status:   Active ; Category:   Drug ; Substance:   contrast media (iodine-based) ; Type:   Allergy ; Severity:   Unknown ; Updated By:   CHARLIES OBIE COUNTS; Reviewed Date:   11/03/2018 20:35 EST      fentaNYL  Estimated Onset Date:   Unspecified ; Created By:   CHARLIES RN, JENNETTE; Reaction Status:   Active ; Category:   Drug ; Substance:   fentaNYL ; Type:   Allergy ; Severity:   Unknown ; Updated By:   CHARLIES OBIE COUNTS; Reviewed Date:   11/03/2018 20:35 EST      morphine  Estimated Onset Date:   Unspecified ; Created By:   CHARLIES, RN, JENNETTE; Reaction Status:   Active ; Category:   Drug ; Substance:   morphine ; Type:   Allergy ; Severity:   Unknown ; Updated By:   CHARLIES OBIE COUNTS; Reviewed Date:   11/03/2018 20:35 EST      Rocephin  Estimated Onset Date:   Unspecified ; Created By:  CHARLIES, RN, JENNETTE; Reaction Status:   Active ; Category:   Drug ; Substance:   Rocephin ; Type:   Allergy ; Severity:   Unknown ; Updated By:   CHARLIES OBIE COUNTS; Reviewed Date:   11/03/2018 20:35 EST        Psycho-Social   Last 3 mo, thoughts killing self/others :   Patient denies   Gerhard Patron - 11/03/2018 20:34 EST   ED Reason for Visit   (As Of: 11/03/2018 20:38:23 EST)   Problems(Active)    Hypertension (IMO  :13508 )  Name of Problem:   Hypertension ; Recorder:   CHARLIES, RN, JENNETTE; Confirmation:   Confirmed ; Classification:   Medical ; Code:   [phone] ; Contributor System:   PowerChart ; Last Updated:   11/08/2015 5:36 EST ; Life Cycle Date:   11/08/2015 ; Life Cycle Status:   Active ; Vocabulary:   IMO        Sickle cell (SNOMED CT  :16827988 )  Name of Problem:   Sickle cell ; Recorder:   ALBAYALDE, RN, JENNETTE; Confirmation:   Confirmed ; Classification:   Medical ; Code:   [id number] ; Contributor System:   PowerChart ; Last Updated:   11/08/2015 5:36 EST ; Life Cycle Date:   11/08/2015 ; Life Cycle Status:   Active ; Vocabulary:   SNOMED CT          Diagnoses(Active)    Sickle cell pain  Date:   11/03/2018 ; Diagnosis Type:   Reason For Visit ; Confirmation:   Complaint of ; Clinical Dx:   Sickle cell pain ;  Classification:   Medical ; Clinical Service:   Non-Specified ; Code:   PNED ; Probability:   0 ; Diagnosis Code:   [id number]

## 2018-11-03 NOTE — ED Provider Notes (Signed)
Sickle Cell Crisis *ED        Patient:   Judith [name], Judith [name]            MRN: [id number]            FIN: [id number]               Age:   32 years     Sex:  Female     DOB:  Jan 11, 1986   Associated Diagnoses:   Sickle cell crisis   Author:   Valene Bors      Basic Information   Time seen: Provider Seen (ST)   ED Provider/Time:    Davy Westmoreland-MD,  Lillian Tigges CLIVE / 11/03/2018 20:41  .   Additional information: Chief Complaint from Nursing Triage Note   Chief Complaint  Chief Complaint: pt c/o "sickle cell crisis x 4 days."  c/o chest pain, back pain, neck pain, bilat hip pain and sob. states "this is my normal pain symptoms when I'm in a crisis." (11/03/18 20:34:00).      History of Present Illness   The patient presents with sickle cell pain.  The onset was 4  days ago.  The course/duration of symptoms is constant and worsening.  The character of symptoms is "pain".  The degree of pain is moderate.  The exacerbating factor is none.  Prior episodes: similar to "sickle cell pain" and occasional.  Associated symptoms: denies vomiting, denies fever, denies headache and denies dizziness.  32 year old female presents with "typical" sickle cell pain x4 days.  Her baseline hemoglobin is 7 she was last transfused about 1 month ago.  Patient believes she has a very mild URI and weather change as a trigger for her current symptoms.  She complains of chest back neck bilateral hip pain and mild shortness of breath.    Physicians up-to-date.  No recent antibiotics.  Is seen by her sickle cell doctor at [facility] on a regular basis.  She is compliant with her pain medicines..        Review of Systems   Constitutional symptoms:  Negative except as documented in HPI, No fever,    Skin symptoms:  No jaundice, no rash, no petechiae.    Eye symptoms:  Negative except as documented in HPI.   ENMT symptoms:  Negative except as documented in HPI.   Respiratory symptoms:  No hemoptysis,    Cardiovascular symptoms:  No peripheral edema,     Gastrointestinal symptoms:  Negative except as documented in HPI.   Genitourinary symptoms:  Negative except as documented in HPI.   Musculoskeletal symptoms:  Negative except as documented in HPI.   Neurologic symptoms:  No headache, no altered level of consciousness, no focal weakness.    Endocrine symptoms:  Negative except as documented in HPI.   Hematologic/Lymphatic symptoms:  Negative except as documented in HPI.             Additional review of systems information: All other systems reviewed and otherwise negative.      Health Status   Allergies:    Allergic Reactions (All)  Unknown  Contrast media (iodine-based)- No reactions were documented.  FentaNYL- No reactions were documented.  Morphine- No reactions were documented.  Rocephin- No reactions were documented..   Medications:  (Selected)   Inpatient Medications  Ordered  promethazine IV - High Alert: 12.5 mg, 0.5 mL, IV Push, Once  Documented Medications  Documented  CeleXA: Oral, Daily, 0 Refill(s)  KlonoPIN: Oral, TID, 0 Refill(s)  Phenergan: 0 Refill(s)  Zofran: 0 Refill(s)  folic acid: Daily, 0 Refill(s)  ibuprofen: 0 Refill(s)  mirtazapine 30 mg oral tablet: 30 mg, 1 tabs, Oral, Once a Day (at bedtime), 30 tabs, 0 Refill(s)  oxyCODONE: Oral, 0 Refill(s)  sodium bicarbonate oral: mg, Oral, QID, 0 Refill(s).      Past Medical/ Family/ Social History   Surgical history: Splenectomy, cholecystectomy, bowel resection  Port.   Problem list:    Active Problems (2)  Hypertension   Sickle cell   .      Physical Examination               Vital Signs   Vital Signs   40/98/1191 47:82 EST Systolic Blood Pressure 956 mmHg    Diastolic Blood Pressure 76 mmHg    Temperature Oral 37.1 degC    Heart Rate Monitored 100 bpm    Respiratory Rate 16 br/min    SpO2 100 %                Per nurse's notes.   Measurements   11/03/2018 20:38 EST Body Mass Index est meas 21.05 kg/m2    Body Mass Index Measured 21.05 kg/m2   11/03/2018 20:34 EST Height/Length Measured 160 cm     Weight Dosing 53.9 kg    Basic Oxygen Information   11/03/2018 20:34 EST SpO2 100 %    Oxygen Therapy Room air    Vital signs reviewed. No acute significant findings..   General:  Alert, mild distress, Uncomfortable but not toxic appearing., dehydrated, Appears uncomfortable but NOT toxic., Not ill-appearing, , Skin: Poor skin turgor.    Skin:  Warm, dry, pink.    Head:  Normocephalic.   Neck:  Supple.   Eye:  Pupils are equal, round and reactive to light.   Ears, nose, mouth and throat:  No pharyngeal erythema or exudate, Mouth: Dry mucous membranes.    Cardiovascular:  Regular rate and rhythm.   Respiratory:  Lungs are clear to auscultation, respirations are non-labored, breath sounds are equal, Symmetrical chest wall expansion.    Chest wall:  No tenderness.   Back:  Nontender, Normal range of motion.    Musculoskeletal:  Normal ROM.   Gastrointestinal:  Soft, Nontender, Non distended.    Neurological:  Alert and oriented to person, place, time, and situation, No focal neurological deficit observed, CN II-XII intact, normal motor observed, normal speech observed, normal coordination observed.    Lymphatics:  No lymphadenopathy.   Psychiatric:  Cooperative, appropriate mood & affect.       Medical Decision Making   Differential Diagnosis::  Sickle cell crisis.   Documents reviewed:  Emergency department nurses' notes, emergency department records, No ER records at this facility from almost 3 years.    Cardiac monitor:  Normal sinus rhythm.   Results review:  Lab results : Lab View   11/03/2018 21:53 EST Estimated Creatinine Clearance 110.28 mL/min   11/03/2018 21:35 EST WBC 16.3 x10e3/mcL  HI    RBC 2.48 x10e6/mcL  LOW    RBC 2.48 x10e6/mcL  LOW    Hgb 6.8 g/dL  CRIT    HCT 20.6 %  CRIT    MCV 83.1 fL    MCH 27.4 pg    MCHC 33.0 g/dL    RDW 19.9 %  HI    Platelet 376 x10e3/mcL    MPV 9.2 fL    Retic Percent 6.1 %  HI    Absolute Retic 0.1500 /mcL  HI  Sodium Lvl 137 mmol/L    Potassium Lvl 3.3 mmol/L  LOW     Chloride 110 mmol/L  HI    CO2 16 mmol/L  LOW    Glucose Random 99 mg/dL    BUN 12 mg/dL    Creatinine Lvl 0.6 mg/dL    AGAP 11 mmol/L    Osmolality Calc 273 mOsm/kg    Calcium Lvl 8.6 mg/dL    eGFR AA 139 mL/min/1.16m???    eGFR Non-AA 120 mL/min/1.747m??   11/03/2018 21:00 EST Appear U POC SLIGHTLY CLOUDY    Color U POC YELLOW    Bili U POC Negative    Blood U POC Negative    Glucose U POC NEGATIVE mg/dL    Ketones U POC Negative mg/dL    Leuk Est U POC Trace    Nitrite U POC Negative    pH U POC 5.5    Protein U POC Trace    Spec Grav U POC 1.015    Urobilin U POC 2.0 EU/dL   11/03/2018 20:57 EST Preg U POC NEGATIVE    Radiology results:  Rad Results (ST)   XR Chest 1 View Portable  ?  11/03/18 21:42:43  Chest AP: 11/03/18    INDICATION: Sickle Cell;Other (please specify).    COMPARISON: July 31, 2015    FINDINGS:  Lines/tubes: A right IJ port tip terminates in the SVC.    Lungs/pleura: No focal consolidation. Mild coarse reticular markings.  Curvilinear reticulation in the right lung base also probably scarring. Left  base atelectasis and/scarring again noted.    Cardiomediastinum: Unchanged enlargement of the cardiac silhouette.    Bones/ Soft tissues: No acute osseous abnormality.    IMPRESSION:  1. No focal consolidation.  2. Mild coarse reticular markings are probably chronic lung changes/technique.  ?  Signed By: RALindell Noe.      Reexamination/ Reevaluation   Vital signs   results included from flowsheet : Vital Signs   1216/60/6301060:10ST Systolic Blood Pressure 13932mHg    Diastolic Blood Pressure 76 mmHg    Temperature Oral 37.1 degC    Heart Rate Monitored 100 bpm    Respiratory Rate 16 br/min    SpO2 100 %      Basic Oxygen Information   11/03/2018 20:34 EST SpO2 100 %    Oxygen Therapy Room air      per nurse's notes   Course: improving.   Pain status: decreased.   Assessment: exam improved.   Interventions: Order Profile (Selected)   Inpatient Orders  Ordered  Benadryl: 25 mg, 0.5 mL, IV  Push, Once  Completed  .Pregnancy Urine POC:   .Urinalysis POC:   Basic Metabolic Panel:   Benadryl: 25 mg, 0.5 mL, IV Push, Once  CBC (Complete Blood Count):   Cardiac/NIBP/Pulse Ox Monitoring:   Dilaudid: 1 mg, IV Push, Once  Dilaudid: 2 mg, 1 mL, IV Push, Once  ED Assessment Adult:   ED Only Oxygen Therapy:   ED Secondary Triage:   ED Triage Adult:   Extra Tube Blue:   May Access Port:   POC-Urine Dipstick collect:   POC-Urine Pregnancy Test collect:   Retic Count Auto:   Saline Lock Insert:   Sodium Chloride 0.9% bolus: 1,000 mL, IV Piggyback, Once  Toradol: 15 mg, 1 mL, IV Push, Once  XR Chest 1 View Portable:   promethazine IV - High Alert: 12.5 mg, 0.5 mL, IV Push, Once  Discontinued  promethazine IV -  High Alert: 12.5 mg, 0.5 mL, IV Push, Once  Prescriptions  Prescribed  promethazine 25 mg oral tablet: 25 mg, 1 tabs, Oral, TID, PRN: nausea/vomiting, 15 tabs, 0 Refill(s).   Notes: MEDICAL DECISION MAKING.   DISCUSSION & PLAN     2250  Does better.  Pain much improved.  Complains of itching.  Plan is to give her a dose of IV Benadryl discharge home.  Prescription Phenergan.  Work note.  She will follow-up with her sickle cell clinic tomorrow.  No indication for antibiotics.  No acute chest syndrome., Hemoglobin at near baseline 7..      Impression and Plan   Diagnosis   Sickle cell crisis (ICD10-CM D57.00, Discharge, Medical)   Plan   Condition: Improved, Stable.    Disposition: Discharged: Time  11/03/2018 23:20:00, to home.    Prescriptions: Launch prescriptions   Pharmacy:  promethazine 25 mg oral tablet (Prescribe): 25 mg, 1 tabs, Oral, TID, PRN: nausea/vomiting, 15 tabs, 0 Refill(s).    Patient was given the following educational materials: Work Excuse 2d Blima Rich [phone]) [phone]), Sickle Cell Anemia, Adult, Sickle Cell Anemia, Adult, Work Excuse 2d Blima Rich [phone]) [phone]).    Limitations: Limited activity.    Follow up with: Follow up with primary care provider Within 1 to 2 days Return to ED if  symptoms worsen.    Counseled: Patient, Regarding diagnosis, Regarding diagnostic results, Regarding treatment plan, Regarding prescription, Patient indicated understanding of instructions.    Signature Line     Electronically Signed on 11/03/2018 10:53 PM EST   ________________________________________________   Meryle Ready CLIVE               Modified by: Meryle Ready CLIVE on 11/03/2018 10:50 PM EST      Modified by: Meryle Ready CLIVE on 11/03/2018 10:53 PM EST

## 2018-11-03 NOTE — ED Notes (Signed)
ED Patient Education Note     ;Patient Education Materials Follows:             [name]  Emergency Department    Please Excuse from Work, School, or Physical Activity    Patient Name:    ** See bottom of page for name and date**        was seen in the Emergency Department today.    Please excuse from work/school  today and next 2 days.    Thank you for your consideration regarding your employee.    Health Care Provider Name (printed):    Enzo Montgomery. Delton Coombes, MD      Health Care Provider (signature): ___________________________________________    This information is not intended to replace advice given to you by your health care provider. Make sure you discuss any questions you have with your health care provider.                      Home Health Care     Sickle Cell Anemia, Adult    Sickle cell anemia is a condition in which red blood cells have an abnormal "sickle" shape. This abnormal shape shortens the cells' life span, which results in a lower than normal concentration of red blood cells in the blood. The sickle shape also causes the cells to clump together and block free blood flow through the blood vessels. As a result, the tissues and organs of the body do not receive enough oxygen. Sickle cell anemia causes organ damage and pain and increases the risk of infection.      CAUSES    Sickle cell anemia is a genetic disorder. Those who receive two copies of the gene have the condition, and those who receive one copy have the trait.    RISK FACTORS    The sickle cell gene is most common in people whose families originated in [place]. Other areas of the globe where sickle cell trait occurs include the [place], [place] and [place], the [place], and the [place].     SIGNS AND SYMPTOMS     Pain, especially in the extremities, back, chest, or abdomen (common). The pain may start suddenly or may develop following an illness, especially if there is dehydration. Pain can also occur due  to overexertion or exposure to extreme temperature changes.     Frequent severe bacterial infections, especially certain types of pneumonia and meningitis.     Pain and swelling in the hands and feet.     Decreased activity. ?     Loss of appetite. ?     Change in behavior.      Headaches.     Seizures.     Shortness of breath or difficulty breathing.     Vision changes.     Skin ulcers.    Those with the trait may not have symptoms or they may have mild symptoms.     DIAGNOSIS    Sickle cell anemia is diagnosed with blood tests that demonstrate the genetic trait. It is often diagnosed during the newborn period, due to mandatory testing nationwide. A variety of blood tests, X-rays, CT scans, MRI scans, ultrasounds, and lung function tests may also be done to monitor the condition.    TREATMENT    Sickle cell anemia may be treated with:     Medicines. You may be given pain medicines, antibiotic medicines (to treat and prevent infections) or medicines to increase  the production of certain types of hemoglobin.     Fluids.     Oxygen.     Blood transfusions.    HOME CARE INSTRUCTIONS     Drink enough fluid to keep your urine clear or pale yellow. Increase your fluid intake in hot weather and during exercise.      Do not smoke. Smoking lowers oxygen levels in the blood. ?     Only take over-the-counter or prescription medicines for pain, fever, or discomfort as directed by your health care provider.     Take antibiotics as directed by your health care provider. Make sure you finish them it even if you start to feel better. ?     Take supplements as directed by your health care provider. ?     Consider wearing a medical alert bracelet. This tells anyone caring for you in an emergency of your condition. ?     When traveling, keep your medical information, health care provider's names, and the medicines you take with you at all times. ?     If you develop a fever, do not take medicines to reduce the fever right away. This  could cover up a problem that is developing. Notify your health care provider.      Keep all follow-up appointments with your health care provider. Sickle cell anemia requires regular medical care.     SEEK MEDICAL CARE IF:    ?You have a fever.    SEEK IMMEDIATE MEDICAL CARE IF:     You feel dizzy or faint. ?     You have new abdominal pain, especially on the left side near the stomach area. ?     You develop a persistent, often uncomfortable and painful penile erection (priapism). If this is not treated immediately it will lead to impotence. ?     You have numbness your arms or legs or you have a hard time moving them. ?     You have a hard time with speech. ?     You have a fever or persistent symptoms for more than 2?3 days. ?     You have a fever and your symptoms suddenly get worse. ?     You have signs or symptoms of infection. These include: ?    ? Chills. ?    ? Abnormal tiredness (lethargy). ?    ? Irritability. ?    ? Poor eating. ?    ? Vomiting. ?     You develop pain that is not helped with medicine. ?     You develop shortness of breath.     You have pain in your chest. ?     You are coughing up pus-like or bloody sputum. ?     You develop a stiff neck.     Your feet or hands swell or have pain.     Your abdomen appears bloated.     You develop joint pain.    MAKE SURE YOU:     Understand these instructions.    This information is not intended to replace advice given to you by your health care provider. Make sure you discuss any questions you have with your health care provider.    Document Released: 01/31/2006 Document Revised: 11/13/2014 Document Reviewed: 06/04/2013  [employer]vier Interactive Patient Education ?2016 [employer]

## 2018-11-04 LAB — BASIC METABOLIC PANEL
Anion Gap: 11 mmol/L (ref 2–17)
BUN: 12 mg/dL (ref 6–20)
CO2: 16 mmol/L — ABNORMAL LOW (ref 22–29)
Calcium: 8.6 mg/dL (ref 8.6–10.0)
Chloride: 110 mmol/L — ABNORMAL HIGH (ref 98–107)
Creatinine: 0.6 mg/dL (ref 0.5–0.9)
GFR African American: 139 mL/min/{1.73_m2} (ref >=90)
GFR Non-African American: 120 mL/min/{1.73_m2} (ref >=90)
Glucose: 99 mg/dL (ref 70–99)
OSMOLALITY CALCULATED: 273 mOsm/kg (ref 270–287)
Potassium: 3.3 mmol/L — ABNORMAL LOW (ref 3.5–5.3)
Sodium: 137 mmol/L (ref 135–145)

## 2018-11-04 LAB — RETICULOCYTES
RBC: 2.48 x10e6/mcL — ABNORMAL LOW (ref 3.60–5.20)
Retic Ct Abs: 0.15 /mcL — ABNORMAL HIGH (ref 0.0210–0.1080)
Retic Ct Pct: 6.1 % — ABNORMAL HIGH (ref 0.5–2.0)

## 2018-11-04 LAB — POC URINALYSIS, CHEMISTRY
Bilirubin, Urine, POC: NEGATIVE
Blood, UA POC: NEGATIVE
Glucose, UA POC: NEGATIVE mg/dL
Ketones, Urine, POC: NEGATIVE mg/dL
Nitrate, UA POC: NEGATIVE
Specific Gravity, Urine, POC: 1.015 (ref 1.003–1.035)
UROBILIN U POC: 2 EU/dL — AB
pH, Urine, POC: 5.5 (ref 4.5–8.0)

## 2018-11-04 LAB — CBC
Hematocrit: 20.6 % — CL (ref 38.0–47.0)
Hemoglobin: 6.8 g/dL — CL (ref 11.5–15.7)
MCH: 27.4 pg (ref 27.0–34.5)
MCHC: 33 g/dL (ref 32.0–36.0)
MCV: 83.1 fL (ref 81.0–99.0)
MPV: 9.2 fL (ref 7.2–13.2)
NRBC Absolute: 0.03 10*3/uL
NRBC Automated: 0.2 %
Platelets: 376 10*3/uL (ref 140–440)
RBC: 2.48 x10e6/mcL — ABNORMAL LOW (ref 3.60–5.20)
RDW: 19.9 % — ABNORMAL HIGH (ref 11.0–16.0)
WBC: 16.3 10*3/uL — ABNORMAL HIGH (ref 3.8–10.6)

## 2018-11-04 LAB — POC PREGNANCY UR-QUAL: Preg Test, Ur: NEGATIVE

## 2019-03-13 LAB — CBC WITH AUTO DIFFERENTIAL
Basophils %: 0.5 % (ref 0.0–2.0)
Basophils Absolute: 0.1 10*3/uL (ref 0.0–0.2)
Eosinophils %: 1.2 % (ref 0.0–7.0)
Eosinophils Absolute: 0.2 10*3/uL (ref 0.0–0.5)
Hematocrit: 20.8 % — CL (ref 38.0–47.0)
Hemoglobin: 7.2 g/dL — ABNORMAL LOW (ref 11.5–15.7)
Immature Grans (Abs): 0.1 10*3/uL
Immature Granulocytes %: 0.6 %
Lymphocytes Absolute: 3.7 10*3/uL — ABNORMAL HIGH (ref 1.0–3.2)
Lymphocytes: 25.5 % (ref 15.0–45.0)
MCH: 27.6 pg (ref 27.0–34.5)
MCHC: 34.6 g/dL (ref 32.0–36.0)
MCV: 79.7 fL — ABNORMAL LOW (ref 81.0–99.0)
MPV: 9.1 fL (ref 7.2–13.2)
Monocytes Absolute: 1.4 10*3/uL — ABNORMAL HIGH (ref 0.3–1.0)
Monocytes: 9.3 % (ref 4.0–12.0)
NRBC Absolute: 0.06 10*3/uL
NRBC Automated: 0.4 %
Neutrophils %: 62.9 % (ref 42.0–74.0)
Neutrophils Absolute: 9.2 10*3/uL — ABNORMAL HIGH (ref 1.6–7.3)
Platelets: 450 10*3/uL — ABNORMAL HIGH (ref 140–440)
RBC: 2.61 x10e6/mcL — ABNORMAL LOW (ref 3.60–5.20)
RDW: 18.5 % — ABNORMAL HIGH (ref 11.0–16.0)
WBC: 14.5 10*3/uL — ABNORMAL HIGH (ref 3.8–10.6)

## 2019-03-13 LAB — COMPREHENSIVE METABOLIC PANEL
ALT: 11 U/L (ref 0–33)
AST: 21 U/L (ref 0–32)
Albumin/Globulin Ratio: 1.41 mmol/L (ref 1.00–2.00)
Albumin: 4.8 g/dL (ref 3.5–5.2)
Alk Phosphatase: 84 U/L (ref 35–117)
Anion Gap: 16 mmol/L (ref 2–17)
BUN: 13 mg/dL (ref 6–20)
CO2: 12 mmol/L — ABNORMAL LOW (ref 22–29)
Calcium: 8.8 mg/dL (ref 8.6–10.0)
Chloride: 109 mmol/L — ABNORMAL HIGH (ref 98–107)
Creatinine: 0.8 mg/dL (ref 0.5–0.9)
GFR African American: 112 mL/min/{1.73_m2} (ref >=90)
GFR Non-African American: 97 mL/min/{1.73_m2} (ref >=90)
Globulin: 3.4 g/dL (ref 1.9–4.4)
Glucose: 107 mg/dL — ABNORMAL HIGH (ref 70–99)
Osmolaliy Calculated: 274 mosm/kg (ref 270–287)
Potassium: 3.8 mmol/L (ref 3.5–5.3)
Sodium: 137 mmol/L (ref 135–145)
Total Bilirubin: 1.5 mg/dL — ABNORMAL HIGH (ref 0.00–1.20)
Total Protein: 8.2 g/dL (ref 6.4–8.3)

## 2019-03-13 LAB — RETICULOCYTES
RBC: 2.61 x10e6/mcL — ABNORMAL LOW (ref 3.60–5.20)
Retic Ct Abs: 0.202 /mcL — ABNORMAL HIGH (ref 0.0210–0.1080)
Retic Ct Pct: 7.7 % — ABNORMAL HIGH (ref 0.5–2.0)

## 2019-03-13 LAB — POC PREGNANCY UR-QUAL: Preg Test, Ur: NEGATIVE

## 2019-03-13 NOTE — ED Notes (Signed)
 ED Patient Summary       ;       [facility] Emergency Department  [address] [place] [address]  [phone]  Discharge Instructions (Patient)  _______________________________________     Name: Judith [name], Judith [name]  DOB: 07/29/1986                   MRN: [id number]                   FIN: [phone]  Reason For Visit: Sickle cell pain; SICKLE CELL COMPLICATIONS  Final Diagnosis: Sickle cell crisis     Visit Date: 03/13/2019 14:46:00  Address: [address] [place] [address]  Phone: [phone]     Primary Care Provider:      Name: PCP,  NONE      Phone:         Emergency Department Providers:         Primary Physician:   NORRIS-MD, JUSTIN AARON         [facility] would like to thank you for allowing us  to assist you with your healthcare needs. The following includes patient education materials and information regarding your injury/illness.     Follow-up Instructions: You were treated today on an emergency basis, it may be wise to contact your primary care provider to notify them of your visit today. You may have been referred to your regular doctor or a specialist, please follow up as instructed. If your condition worsens or you can't get in to see the doctor, contact the Emergency Department.              With: Address: When:   [name], View Only Providers - No Access [address] [place], [place] [address]  [phone] Business (1) Within 1 week   Comments:   Take all medications as prescribed. Drink plenty of fluids. Return to the ED immediately in the next 12-24 hours if symptoms worse in anyway or if any new symptoms develop. Arrange follow-up with your hematologist for continuing management of today's complaints and for chronic medical issues     If you do not have a primary care call [phone] to arrange for a primary care physician     Please read all instructions as there may be items that were not fully discussed during your ED evaluation including all  diagnosis and prescription medication instructions.              Printed Prescriptions:    Patient Education Materials:  Discharge Orders          Discharge Patient 03/13/19 18:06:00 EDT         Comment:      Sickle Cell Anemia, Adult     Sickle Cell Anemia, Adult    Sickle cell anemia is a condition in which red blood cells have an abnormal sickle shape. This abnormal shape shortens the cells' life span, which results in a lower than normal concentration of red blood cells in the blood. The sickle shape also causes the cells to clump together and block free blood flow through the blood vessels. As a result, the tissues and organs of the body do not receive enough oxygen. Sickle cell anemia causes organ damage and pain and increases the risk of infection.      CAUSES    Sickle cell anemia is a genetic disorder. Those who receive two copies of the gene have the condition, and those  who receive one copy have the trait.    RISK FACTORS    The sickle cell gene is most common in people whose families originated in [place]. Other areas of the globe where sickle cell trait occurs include the [place], [place], the [place], and the [place].     SIGNS AND SYMPTOMS     Pain, especially in the extremities, back, chest, or abdomen (common). The pain may start suddenly or may develop following an illness, especially if there is dehydration. Pain can also occur due to overexertion or exposure to extreme temperature changes.     Frequent severe bacterial infections, especially certain types of pneumonia and meningitis.     Pain and swelling in the hands and feet.     Decreased activity. ?     Loss of appetite. ?     Change in behavior.      Headaches.     Seizures.     Shortness of breath or difficulty breathing.     Vision changes.     Skin ulcers.    Those with the trait may not have symptoms or they may have mild symptoms.     DIAGNOSIS    Sickle cell anemia is diagnosed with blood tests that  demonstrate the genetic trait. It is often diagnosed during the newborn period, due to mandatory testing nationwide. A variety of blood tests, X-rays, CT scans, MRI scans, ultrasounds, and lung function tests may also be done to monitor the condition.    TREATMENT    Sickle cell anemia may be treated with:     Medicines. You may be given pain medicines, antibiotic medicines (to treat and prevent infections) or medicines to increase the production of certain types of hemoglobin.     Fluids.     Oxygen.     Blood transfusions.    HOME CARE INSTRUCTIONS     Drink enough fluid to keep your urine clear or pale yellow. Increase your fluid intake in hot weather and during exercise.      Do not smoke. Smoking lowers oxygen levels in the blood. ?     Only take over-the-counter or prescription medicines for pain, fever, or discomfort as directed by your health care provider.     Take antibiotics as directed by your health care provider. Make sure you finish them it even if you start to feel better. ?     Take supplements as directed by your health care provider. ?     Consider wearing a medical alert bracelet. This tells anyone caring for you in an emergency of your condition. ?     When traveling, keep your medical information, health care provider's names, and the medicines you take with you at all times. ?     If you develop a fever, do not take medicines to reduce the fever right away. This could cover up a problem that is developing. Notify your health care provider.      Keep all follow-up appointments with your health care provider. Sickle cell anemia requires regular medical care.     SEEK MEDICAL CARE IF:    ?You have a fever.    SEEK IMMEDIATE MEDICAL CARE IF:     You feel dizzy or faint. ?     You have new abdominal pain, especially on the left side near the stomach area. ?     You develop a persistent, often uncomfortable and painful penile erection (priapism). If this is  not treated immediately it will lead to  impotence. ?     You have numbness your arms or legs or you have a hard time moving them. ?     You have a hard time with speech. ?     You have a fever or persistent symptoms for more than 2?3 days. ?     You have a fever and your symptoms suddenly get worse. ?     You have signs or symptoms of infection. These include: ?    ? Chills. ?    ? Abnormal tiredness (lethargy). ?    ? Irritability. ?    ? Poor eating. ?    ? Vomiting. ?     You develop pain that is not helped with medicine. ?     You develop shortness of breath.     You have pain in your chest. ?     You are coughing up pus-like or bloody sputum. ?     You develop a stiff neck.     Your feet or hands swell or have pain.     Your abdomen appears bloated.     You develop joint pain.    MAKE SURE YOU:     Understand these instructions.    This information is not intended to replace advice given to you by your health care provider. Make sure you discuss any questions you have with your health care provider.    Document Released: 01/31/2006 Document Revised: 11/13/2014 Document Reviewed: 06/04/2013  Elsevier Interactive Patient Education ?2016 [employer]vier Inc.         Allergy Info: fentaNYL; contrast media (iodine-based); Rocephin; morphine     Medication Information:  [facility] ED Physicians provided you with a complete list of medications post discharge, if you have been instructed to stop taking a medication please ensure you also follow up with this information to your Primary Care Physician.  Unless otherwise noted, patient will continue to take medications as prescribed prior to the Emergency Room visit.  Any specific questions regarding your chronic medications and dosages should be discussed with your physician(s) and pharmacist.          citalopram (CeleXA) Oral (given by mouth) every day.  clonazePAM (KlonoPIN) Oral (given by mouth) 3 times a day.  folic acid every day.  ibuprofen  mirtazapine (mirtazapine 30 mg oral tablet) 1 Tabs Oral  (given by mouth) Once a Day (at bedtime).  oxyCODONE Oral (given by mouth).  promethazine (Phenergan)  promethazine (promethazine 25 mg oral tablet) 1 Tabs Oral (given by mouth) 3 times a day as needed nausea/vomiting. Refills: 0.  sodium bicarbonate (sodium bicarbonate oral) Oral (given by mouth) 4 times a day.      Medications Administered During Visit:              Medication Dose Route   Sodium Chloride 0.9% 2000 mL IV Piggyback   diphenhydrAMINE 25 mg IV Push   promethazine 12.5 mg IV   hydromorphone 2 mg IV Push   hydromorphone 4 mg IV Push   ondansetron 8 mg IV Push   diphenhydrAMINE 25 mg IV Push   hydromorphone 2 mg IV Push   heparin flush 500 units Intracath          Major Tests and Procedures:  The following procedures and tests were performed during your ED visit.  COMMON PROCEDURES%>  COMMON PROCEDURES COMMENTS%>          Laboratory Orders  Name Status Details   .Preg U POC Completed Urine, RT, RT - Routine, Collected, 03/13/19 15:27:00 EDT, Nurse collect, 03/13/19 15:27:00 US Robinette, RAL POC Login   CBCDIFF Completed Blood, Stat, ST - Stat, 03/13/19 15:04:00 EDT, 03/13/19 15:04:00 EDT, Nurse collect, NORRIS-MD,  EVA RIGHTER, Print label Y/N   CMP Completed Blood, Stat, ST - Stat, 03/13/19 15:04:00 EDT, 03/13/19 15:04:00 EDT, Nurse collect, NORRIS-MD,  EVA RIGHTER, Print label Y/N   Retic Auto Completed Blood, Stat, ST - Stat, 03/13/19 15:04:00 EDT, 03/13/19 15:04:00 EDT, Nurse collect, NORRIS-MD,  EVA RIGHTER, Print label Y/N   Irwin Carbon Completed Blood, Stat, ST - Stat, Collected, 03/13/19 15:42:00 EDT [id number], 03/13/19 15:42:00 EDT, Nurse collect, Venous Draw, 03/13/19 15:44:00 EDT, SF CP Login, NORRIS-MD,  JUSTIN AARON, Print label Y/N, sf_laboratory_1, 1.8 mL Aolz/*[id number]*/, Complete   XTube SST Completed Blood, Stat, ST - Stat, Collected, 03/13/19 15:42:00 EDT [id number], 03/13/19 15:42:00 EDT, Nurse collect, Venous Draw, 03/13/19 15:45:00 EDT, SF CP Login, NORRIS-MD,  JUSTIN AARON, Print  label Y/N, sf_laboratory_1, 4 mL DDU/*[id number]*/, Complete               Radiology Orders  No radiology orders were placed.              Patient Care Orders  Name Status Details   Discharge Patient Ordered 03/13/19 18:06:00 EDT   ED Assessment Adult Completed 03/13/19 14:54:56 EDT, 03/13/19 14:54:56 EDT   ED Secondary Triage Completed 03/13/19 14:54:56 EDT, 03/13/19 14:54:56 EDT   ED Triage Adult Completed 03/13/19 14:46:10 EDT, 03/13/19 14:46:10 EDT   May Access Port Completed 03/13/19 15:18:00 EDT, This message can only be seen by Nursing, it is not visible to Pharmacy, Laboratory, or Radiology., 03/13/19 15:18:00 EDT, 03/13/19 15:18:00 EDT, Once   NIBP/Pulse Ox Monitoring Completed 03/13/19 15:04:00 EDT, This message can only be seen by Nursing, it is not visible to Pharmacy, Laboratory, or Radiology., 03/13/19 15:04:00 EDT, 03/13/19 15:04:00 EDT, Once   POC-Urine Pregnancy Test collect Completed 03/13/19 15:04:00 EDT, Once, 03/13/19 15:04:00 EDT   Saline Lock Insert Completed 03/13/19 15:04:00 EDT, Once, 03/13/19 15:04:00 EDT       ---------------------------------------------------------------------------------------------------------------------  [facility] allows you to manage your health, view your test results, and retrieve your discharge documents from your hospital stay securely and conveniently from your computer.     To begin the enrollment process, visit [email or web address] Click on "Sign up now" under [facility].   Comment:

## 2019-03-13 NOTE — ED Notes (Signed)
ED Triage Note       ED Triage Adult Entered On:  03/13/2019 14:54 EDT    Performed On:  03/13/2019 14:48 EDT by Tally DueVerrochi, RN, RACHAEL M               Triage   Chief Complaint :   states sickle cell pain in bilateral hips, trunk and bilateral arms x 3 days   Numeric Rating Pain Scale :   8   ED Pain Details :   Pain Details   [place] Mode of Arrival :   Walking   Infectious Disease Documentation :   Document assessment   Temperature Oral :   36.4 degC(Converted to: 97.5 degF)    Heart Rate Monitored :   102 bpm (HI)    Respiratory Rate :   16 br/min   Systolic Blood Pressure :   129 mmHg   Diastolic Blood Pressure :   102 mmHg (>HHI)    SpO2 :   97 %   Oxygen Therapy :   Room air   Patient presentation :   HR > 100   Chief Complaint or Presentation suggest infection :   Yes   Dosing Weight Obtained By :   Patient stated   Weight Dosing :   54.5 kg(Converted to: 120 lb 2 oz)    Height :   160 cm(Converted to: 5 ft 3 in)    Body Mass Index Dosing :   21 kg/m2   Verrochi, RN, [place] M - 03/13/2019 14:48 EDT   DCP GENERIC CODE   Tracking Acuity :   3   Tracking Group :   ED [facility] [employer]   Verrochi, RN, Best BuyACHAEL M - 03/13/2019 14:48 EDT   ED General Section :   Document assessment   Pregnancy Status :   Patient denies   Approximate Last Menstrual Period :   no period with depo shots   ED Allergies Section :   Document assessment   ED Reason for Visit Section :   Document assessment   ED Home Meds Section :   Document assessment   Verrochi, RN, Earlene PlaterRACHAEL M - 03/13/2019 14:48 EDT   ID Risk Screen Symptoms   Recent Travel History :   No recent travel   Close Contact with COVID-19  ID :   No   Have you been tested for COVID-19 ID :   No   TB Symptom Screen :   No symptoms   C. diff Symptom/History ID :   Neither of the above   Verrochi, RN, RACHAEL M - 03/13/2019 14:48 EDT   Allergies   (As Of: 03/13/2019 14:54:55 EDT)   Allergies (Active)   contrast media (iodine-based)  Estimated Onset Date:   Unspecified ; Created ByRock Nephew:    ALBAYALDE, RN, JENNETTE; Reaction Status:   Active ; Category:   Drug ; Substance:   contrast media (iodine-based) ; Type:   Allergy ; Severity:   Unknown ; Updated By:   Irene LimboALBAYALDE, RN, JENNETTE; Reviewed Date:   03/13/2019 14:52 EDT      fentaNYL  Estimated Onset Date:   Unspecified ; Created By:   Rock NephewALBAYALDE, RN, JENNETTE; Reaction Status:   Active ; Category:   Drug ; Substance:   fentaNYL ; Type:   Allergy ; Severity:   Unknown ; Updated By:   Irene LimboALBAYALDE, RN, JENNETTE; Reviewed Date:   03/13/2019 14:52 EDT      morphine  Estimated Onset Date:  Unspecified ; Created By:   Rock Nephew RN, JENNETTE; Reaction Status:   Active ; Category:   Drug ; Substance:   morphine ; Type:   Allergy ; Severity:   Unknown ; Updated By:   Irene Limbo; Reviewed Date:   03/13/2019 14:52 EDT      Rocephin  Estimated Onset Date:   Unspecified ; Created By:   Rock Nephew RN, JENNETTE; Reaction Status:   Active ; Category:   Drug ; Substance:   Rocephin ; Type:   Allergy ; Severity:   Unknown ; Updated By:   Irene Limbo; Reviewed Date:   03/13/2019 14:52 EDT        Pain Assessment   Preferred Pain Tool :   Numeric rating scale   Numeric Rating With Activity :   8   Numeric Rating Score With Activity :   8    Laterality :   Bilateral   Pain Location :   Generalized   Quality :   Aching, Sharp   Pain Aggravating Factors History :   Cold   Pain Alleviating Factors History :   Heat   Verrochi, RN, Earlene Plater - 03/13/2019 14:48 EDT   Image 4 -  Images currently included in the form version of this document have not been included in the text rendition version of the form.   Psycho-Social   Last 3 mo, thoughts killing self/others :   Patient denies   Injuries/Abuse/Neglect in Household :   Denies   Feels Unsafe at Home :   No   ED Behavioral Activity Rating Scale :   4 - Quiet and awake (normal level of activity)   Verrochi, RN, Earlene Plater - 03/13/2019 14:48 EDT   ED Home Med List   Medication List   (As Of: 03/13/2019 14:54:55 EDT)    Prescription/Discharge Order    promethazine  :   promethazine ; Status:   Prescribed ; Ordered As Mnemonic:   promethazine 25 mg oral tablet ; Simple Display Line:   25 mg, 1 tabs, Oral, TID, PRN: nausea/vomiting, 15 tabs, 0 Refill(s) ; Ordering Provider:   Leana Gamer; Catalog Code:   promethazine ; Order Dt/Tm:   11/03/2018 22:50:55 EST            Home Meds    folic acid  :   folic acid ; Status:   Documented ; Ordered As Mnemonic:   folic acid ; Simple Display Line:   Daily, 0 Refill(s) ; Catalog Code:   folic acid ; Order Dt/Tm:   11/08/2015 05:36:58 EST          sodium bicarbonate  :   sodium bicarbonate ; Status:   Documented ; Ordered As Mnemonic:   sodium bicarbonate oral ; Simple Display Line:   mg, Oral, QID, 0 Refill(s) ; Catalog Code:   sodium bicarbonate ; Order Dt/Tm:   11/08/2015 05:37:07 EST          promethazine  :   promethazine ; Status:   Documented ; Ordered As Mnemonic:   Phenergan ; Simple Display Line:   0 Refill(s) ; Catalog Code:   promethazine ; Order Dt/Tm:   11/08/2015 05:37:14 EST          ondansetron  :   ondansetron ; Status:   Voided ; Ordered As Mnemonic:   Zofran ; Simple Display Line:   0 Refill(s) ; Catalog Code:   ondansetron ; Order Dt/Tm:   11/08/2015 05:37:31 EST  oxyCODONE  :   oxyCODONE ; Status:   Documented ; Ordered As Mnemonic:   oxyCODONE ; Simple Display Line:   Oral, 0 Refill(s) ; Catalog Code:   oxyCODONE ; Order Dt/Tm:   11/08/2015 05:37:37 EST          clonazePAM  :   clonazePAM ; Status:   Documented ; Ordered As Mnemonic:   KlonoPIN ; Simple Display Line:   Oral, TID, 0 Refill(s) ; Catalog Code:   clonazePAM ; Order Dt/Tm:   11/08/2015 05:37:43 EST          ibuprofen  :   ibuprofen ; Status:   Documented ; Ordered As Mnemonic:   ibuprofen ; Simple Display Line:   0 Refill(s) ; Catalog Code:   ibuprofen ; Order Dt/Tm:   11/08/2015 05:37:49 EST          mirtazapine  :   mirtazapine ; Status:   Documented ; Ordered As Mnemonic:   mirtazapine 30 mg oral  tablet ; Simple Display Line:   30 mg, 1 tabs, Oral, Once a Day (at bedtime), 30 tabs, 0 Refill(s) ; Catalog Code:   mirtazapine ; Order Dt/Tm:   09/15/2017 03:00:13 EST          citalopram  :   citalopram ; Status:   Documented ; Ordered As Mnemonic:   CeleXA ; Simple Display Line:   Oral, Daily, 0 Refill(s) ; Catalog Code:   citalopram ; Order Dt/Tm:   09/15/2017 03:00:10 EST            ED Reason for Visit   (As Of: 03/13/2019 14:54:55 EDT)   Problems(Active)    Hypertension (IMO  :59458 )  Name of Problem:   Hypertension ; Recorder:   Rock Nephew, RN, JENNETTE; Confirmation:   Confirmed ; Classification:   Medical ; Code:   [phone] ; Contributor System:   Dietitian ; Last Updated:   11/08/2015 5:36 EST ; Life Cycle Date:   11/08/2015 ; Life Cycle Status:   Active ; Vocabulary:   IMO        Sickle cell (SNOMED CT  :44628638 )  Name of Problem:   Sickle cell ; Recorder:   ALBAYALDE, RN, JENNETTE; Confirmation:   Confirmed ; Classification:   Medical ; Code:   [id number] ; Contributor System:   Dietitian ; Last Updated:   11/08/2015 5:36 EST ; Life Cycle Date:   11/08/2015 ; Life Cycle Status:   Active ; Vocabulary:   SNOMED CT          Diagnoses(Active)    Sickle cell pain  Date:   03/13/2019 ; Diagnosis Type:   Reason For Visit ; Confirmation:   Complaint of ; Clinical Dx:   Sickle cell pain ; Classification:   Medical ; Clinical Service:   Emergency medicine ; Code:   PNED ; Probability:   0 ; Diagnosis Code:   [id number]

## 2019-03-13 NOTE — ED Notes (Signed)
ED Note-Nursing       ED RN Reassessment Entered On:  03/13/2019 18:15 EDT    Performed On:  03/13/2019 18:15 EDT by Chestine Spore, RN, [place]iann Mohs               ED RN Reassessment   ED RN Progress Note :   R chest port, needle removed, no bleeding from site.   Chestine Spore RN, Georgiann Mohs - 03/13/2019 18:15 EDT

## 2019-03-13 NOTE — ED Notes (Signed)
ED Triage Note       ED Secondary Triage Entered On:  03/13/2019 15:28 EDT    Performed On:  03/13/2019 15:28 EDT by Aldine Contes, RN, [place] A               General Information   Barriers to Learning :   None evident   ED Home Meds Section :   Document assessment   [facility] ED Fall Risk Section :   Document assessment   ED Advance Directives Section :   Document assessment   Aldine Contes, RN, [place] A - 03/13/2019 15:28 EDT   (As Of: 03/13/2019 15:28:19 EDT)   Problems(Active)    Hypertension (IMO  :97530 )  Name of Problem:   Hypertension ; Recorder:   Rock Nephew, RN, JENNETTE; Confirmation:   Confirmed ; Classification:   Medical ; Code:   [phone] ; Contributor System:   Dietitian ; Last Updated:   11/08/2015 5:36 EST ; Life Cycle Date:   11/08/2015 ; Life Cycle Status:   Active ; Vocabulary:   IMO        Sickle cell (SNOMED CT  :21117356 )  Name of Problem:   Sickle cell ; Recorder:   ALBAYALDE, RN, JENNETTE; Confirmation:   Confirmed ; Classification:   Medical ; Code:   [id number] ; Contributor System:   Dietitian ; Last Updated:   11/08/2015 5:36 EST ; Life Cycle Date:   11/08/2015 ; Life Cycle Status:   Active ; Vocabulary:   SNOMED CT          Diagnoses(Active)    Sickle cell pain  Date:   03/13/2019 ; Diagnosis Type:   Reason For Visit ; Confirmation:   Complaint of ; Clinical Dx:   Sickle cell pain ; Classification:   Medical ; Clinical Service:   Emergency medicine ; Code:   PNED ; Probability:   0 ; Diagnosis Code:   [id number]             -    Procedure History   (As Of: 03/13/2019 15:28:20 EDT)     Anesthesia Minutes:   0 ; Procedure Name:   Port ; Procedure Minutes:   0            [facility] Fall Risk Assessment Tool   Patient confused or disoriented ED Fall :   No   Patient intoxicated or sedated ED Fall :   No   Patient impaired gait ED Fall :   No   Use a mobility assistance device ED Fall :   No   Patient altered elimination ED Fall :   No   Hino, RN, [place] A - 03/13/2019 15:28 EDT   ED Advance Directive    Advance Directive :   No   Hino, RN, [place] A - 03/13/2019 15:28 EDT

## 2019-03-13 NOTE — ED Notes (Signed)
ED Patient Education Note     Patient Education Materials Follows:  Home Health Care     Sickle Cell Anemia, Adult    Sickle cell anemia is a condition in which red blood cells have an abnormal "sickle" shape. This abnormal shape shortens the cells' life span, which results in a lower than normal concentration of red blood cells in the blood. The sickle shape also causes the cells to clump together and block free blood flow through the blood vessels. As a result, the tissues and organs of the body do not receive enough oxygen. Sickle cell anemia causes organ damage and pain and increases the risk of infection.      CAUSES    Sickle cell anemia is a genetic disorder. Those who receive two copies of the gene have the condition, and those who receive one copy have the trait.    RISK FACTORS    The sickle cell gene is most common in people whose families originated in [place]. Other areas of the globe where sickle cell trait occurs include the [place], [place], the [place], and the [place].     SIGNS AND SYMPTOMS     Pain, especially in the extremities, back, chest, or abdomen (common). The pain may start suddenly or may develop following an illness, especially if there is dehydration. Pain can also occur due to overexertion or exposure to extreme temperature changes.     Frequent severe bacterial infections, especially certain types of pneumonia and meningitis.     Pain and swelling in the hands and feet.     Decreased activity. ?     Loss of appetite. ?     Change in behavior.      Headaches.     Seizures.     Shortness of breath or difficulty breathing.     Vision changes.     Skin ulcers.    Those with the trait may not have symptoms or they may have mild symptoms.     DIAGNOSIS    Sickle cell anemia is diagnosed with blood tests that demonstrate the genetic trait. It is often diagnosed during the newborn period, due to mandatory testing nationwide. A variety of blood tests, X-rays, CT  scans, MRI scans, ultrasounds, and lung function tests may also be done to monitor the condition.    TREATMENT    Sickle cell anemia may be treated with:     Medicines. You may be given pain medicines, antibiotic medicines (to treat and prevent infections) or medicines to increase the production of certain types of hemoglobin.     Fluids.     Oxygen.     Blood transfusions.    HOME CARE INSTRUCTIONS     Drink enough fluid to keep your urine clear or pale yellow. Increase your fluid intake in hot weather and during exercise.      Do not smoke. Smoking lowers oxygen levels in the blood. ?     Only take over-the-counter or prescription medicines for pain, fever, or discomfort as directed by your health care provider.     Take antibiotics as directed by your health care provider. Make sure you finish them it even if you start to feel better. ?     Take supplements as directed by your health care provider. ?     Consider wearing a medical alert bracelet. This tells anyone caring for you in an emergency of your condition. ?     When traveling,  keep your medical information, health care provider's names, and the medicines you take with you at all times. ?     If you develop a fever, do not take medicines to reduce the fever right away. This could cover up a problem that is developing. Notify your health care provider.      Keep all follow-up appointments with your health care provider. Sickle cell anemia requires regular medical care.     SEEK MEDICAL CARE IF:    ?You have a fever.    SEEK IMMEDIATE MEDICAL CARE IF:     You feel dizzy or faint. ?     You have new abdominal pain, especially on the left side near the stomach area. ?     You develop a persistent, often uncomfortable and painful penile erection (priapism). If this is not treated immediately it will lead to impotence. ?     You have numbness your arms or legs or you have a hard time moving them. ?     You have a hard time with speech. ?     You have a fever or  persistent symptoms for more than 2?3 days. ?     You have a fever and your symptoms suddenly get worse. ?     You have signs or symptoms of infection. These include: ?    ? Chills. ?    ? Abnormal tiredness (lethargy). ?    ? Irritability. ?    ? Poor eating. ?    ? Vomiting. ?     You develop pain that is not helped with medicine. ?     You develop shortness of breath.     You have pain in your chest. ?     You are coughing up pus-like or bloody sputum. ?     You develop a stiff neck.     Your feet or hands swell or have pain.     Your abdomen appears bloated.     You develop joint pain.    MAKE SURE YOU:     Understand these instructions.    This information is not intended to replace advice given to you by your health care provider. Make sure you discuss any questions you have with your health care provider.    Document Released: 01/31/2006 Document Revised: 11/13/2014 Document Reviewed: 06/04/2013  Elsevier Interactive Patient Education ?2016 [employer]

## 2019-03-13 NOTE — ED Provider Notes (Signed)
 Sickle cell pain        Patient:   Judith [name], Judith [name]            MRN: [id number]            FIN: [id number]               Age:   33 years     Sex:  Female     DOB:  11-07-1985   Associated Diagnoses:   Sickle cell crisis   Author:   NORRIS-MD,  JUSTIN AARON      Basic Information   Time seen: Provider Seen (ST)   ED Provider/Time:    NORRIS-MD,  JUSTIN AARON / 03/13/2019 14:56  .   History source: Patient.   Arrival mode: Private vehicle.   History limitation: None.   Additional information: Chief Complaint from Nursing Triage Note   Chief Complaint  Chief Complaint: states sickle cell pain in bilateral hips, trunk and bilateral arms x 3 days (03/13/19 14:48:00).      History of Present Illness   The patient presents with sickle cell crisis and sickle cell pain.  The onset was 3  days ago.  The course/duration of symptoms is constant.  Location: Trunk upper extremity lower extremity. The character of symptoms is dull.  The degree of pain is severe.  The exacerbating factor is Temperature change.  The relieving factor is none.  Risk factors consist of hypertension.  Prior episodes: similar to sickle cell pain.  Therapy today: none.  Associated symptoms: Palpitations, headache.  Patient presents to the ED with complaints of sickle cell pain that is been going on for the past several days.  Patient reports history of similar complaints with her sickle cell crises.  Patient reports that she sees Dr. Pamila Lunger with [facility] who usually takes care of her.  She reports that she was last seen over 2 weeks ago for pain management.  Patient denies any other complaints at this time except for above.  She reports no recent sick contacts that she is aware of, fever, chest pain, or shortness of breath.        Review of Systems   Constitutional symptoms:  No fever, no chills, no fatigue.    Skin symptoms:  No rash, no dryness.    Eye symptoms:  Vision unchanged.   ENMT symptoms:  No nasal congestion, no sinus pain.    Respiratory  symptoms:  No shortness of breath, no cough.    Cardiovascular symptoms:  No chest pain, no tachycardia, no syncope.    Gastrointestinal symptoms:  No abdominal pain, no nausea, no vomiting, no diarrhea, no constipation.    Genitourinary symptoms:  No dysuria, no hematuria.    Musculoskeletal symptoms:  No back pain, no Joint pain.    Neurologic symptoms:  No dizziness, no altered level of consciousness, no focal weakness.    Psychiatric symptoms:  No anxiety, no depression.    Hematologic/Lymphatic symptoms:  Bleeding tendency negative, no petechiae.              Additional review of systems information: All other systems reviewed and otherwise negative.      Health Status   Allergies:    Allergic Reactions (Selected)  Unknown  Contrast media (iodine-based)- No reactions were documented.  FentaNYL- No reactions were documented.  Morphine- No reactions were documented.  Rocephin- No reactions were documented..   Medications:  (Selected)   Inpatient Medications  Ordered  Benadryl: 25 mg, 0.5 mL,  IV Push, Once  Dilaudid: 2 mg, IV Push, Once  Phenergan IV - High Alert: 12.5 mg, 0.5 mL, IV, Once  Sodium Chloride 0.9% bolus: 2,000 mL, IV Piggyback, Once  Prescriptions  Prescribed  promethazine 25 mg oral tablet: 25 mg, 1 tabs, Oral, TID, PRN: nausea/vomiting, 15 tabs, 0 Refill(s)  Documented Medications  Documented  CeleXA: Oral, Daily, 0 Refill(s)  KlonoPIN: Oral, TID, 0 Refill(s)  Phenergan: 0 Refill(s)  folic acid: Daily, 0 Refill(s)  ibuprofen: 0 Refill(s)  mirtazapine 30 mg oral tablet: 30 mg, 1 tabs, Oral, Once a Day (at bedtime), 30 tabs, 0 Refill(s)  oxyCODONE: Oral, 0 Refill(s)  sodium bicarbonate oral: mg, Oral, QID, 0 Refill(s).   Immunizations: Up to date.      Past Medical/ Family/ Social History   Medical history:    No active or resolved past medical history items have been selected or recorded., Reviewed as documented in chart.   Surgical history:    Port ([id number])., Reviewed as documented in chart.    Family history:    No family history items have been selected or recorded., Reviewed as documented in chart.   Social history:    Social & Psychosocial Habits    Tobacco  11/08/2015  Use: Former smoker  , Reviewed as documented in chart.   Problem list:    Active Problems (2)  Hypertension   Sickle cell   , per nurse's notes.      Physical Examination               Vital Signs   Vital Signs   03/13/2019 14:48 EDT Systolic Blood Pressure 129 mmHg    Diastolic Blood Pressure 102 mmHg  >HHI    Temperature Oral 36.4 degC    Heart Rate Monitored 102 bpm  HI    Respiratory Rate 16 br/min    SpO2 97 %    Measurements   03/13/2019 14:54 EDT Body Mass Index est meas 21.29 kg/m2    Body Mass Index Measured 21.29 kg/m2   03/13/2019 14:48 EDT Height/Length Measured 160 cm    Weight Dosing 54.5 kg    Basic Oxygen Information   03/13/2019 14:48 EDT SpO2 97 %    Oxygen Therapy Room air    General:  Alert, mild distress.    Skin:  Warm, dry, intact.    Head:  Normocephalic, atraumatic.    Neck:  Supple, trachea midline, no tenderness, no JVD.    Eye:  Pupils are equal, round and reactive to light, extraocular movements are intact.    Ears, nose, mouth and throat:  Oral mucosa moist.   Cardiovascular:  No murmur, Normal peripheral perfusion, No edema, Tachycardia.    Respiratory:  Lungs are clear to auscultation, respirations are non-labored, breath sounds are equal, Symmetrical chest wall expansion.    Chest wall:  No tenderness, No deformity.    Back:  Nontender, Normal range of motion.    Musculoskeletal:  Normal ROM, normal strength, no tenderness, no swelling, no deformity.    Gastrointestinal:  Soft, Nontender, Non distended, Normal bowel sounds, No organomegaly.    Genitourinary   Neurological:  Alert and oriented to person, place, time, and situation, No focal neurological deficit observed, CN II-XII intact.    Lymphatics:  No lymphadenopathy.   Psychiatric:  Cooperative, appropriate mood & affect.       Medical Decision Making    Differential Diagnosis::  Sickle cell crisis.   Rationale:  03/13/2019 15:07:16: Based on the patient's  presentation and history of present illness, patient will have laboratory analysis and imaging studies for further management of today's complaints.  Patient will be monitored for any signs of deterioration and we will adjust treatments accordingly.   Documents reviewed:  Emergency department nurses' notes.   Orders  Launch Order Profile (Selected)   Inpatient Orders  Ordered  Benadryl: 25 mg, 0.5 mL, IV Push, Once  Dilaudid: 2 mg, IV Push, Once  NIBP/Pulse Ox Monitoring:   POC-Urine Pregnancy Test collect:   Phenergan IV - High Alert: 12.5 mg, 0.5 mL, IV, Once  Saline Lock Insert:   Sodium Chloride 0.9% bolus: 2,000 mL, IV Piggyback, Once  Ordered (Dispatched)  CBC Automated, With Diff:   CMP:   Retic Count Auto: .   Results review:  Lab results : Lab View   03/13/2019 16:11 EDT Estimated Creatinine Clearance 82.71 mL/min   03/13/2019 15:42 EDT WBC 14.5 x10e3/mcL  HI    RBC 2.61 x10e6/mcL  LOW    RBC 2.61 x10e6/mcL  LOW    Hgb 7.2 g/dL  LOW    HCT 79.1 %  CRIT    MCV 79.7 fL  LOW    MCH 27.6 pg    MCHC 34.6 g/dL    RDW 81.4 %  HI    Platelet 450 x10e3/mcL  HI    MPV 9.1 fL    Neutro Auto 62.9 %    Neutro Absolute 9.2 x10e3/mcL  HI    Immature Grans Percent 0.6 %  NA    Immature Grans Absolute 0.1 x10e3/mcL  NA    Lymph Auto 25.5 %    Lymph Absolute 3.7 x10e3/mcL  HI    Mono Auto 9.3 %    Mono Absolute 1.4 x10e3/mcL  HI    Eosinophil Percent 1.2 %    Eos Absolute 0.2 x10e3/mcL    Basophil Auto 0.5 %    Baso Absolute 0.1 x10e3/mcL    NRBC Absolute Auto 0.06 x10e3/mcL  NA    NRBC Percent Auto 0.4 %  NA    Retic Percent 7.7 %  HI    Absolute Retic 0.2020 /mcL  HI    Sodium Lvl 137 mmol/L    Potassium Lvl 3.8 mmol/L    Chloride 109 mmol/L  HI    CO2 12 mmol/L  LOW    Glucose Random 107 mg/dL  HI    BUN 13 mg/dL    Creatinine Lvl 0.8 mg/dL    AGAP 16 mmol/L    Osmolality Calc 274 mOsm/kg    Calcium Lvl 8.8 mg/dL    Protein  Total 8.2 g/dL    Albumin Lvl 4.8 g/dL    Globulin Calc 3.4 g/dL    AG Ratio Calc 8.58 mmol/L    Alk Phos 84 unit/L    AST 21 unit/L    ALT 11 unit/L    eGFR AA 112 mL/min/1.60m    eGFR Non-AA 97 mL/min/1.30m    Bili Total 1.50 mg/dL  HI   02/05/7978 84:72 EDT Preg U POC NEGATIVE   03/13/2019 14:54 EDT Estimated Creatinine Clearance 110.28 mL/min       Reexamination/ Reevaluation   Time: 03/13/2019 16:45:00 .   Vital signs   Basic Oxygen Information   03/13/2019 14:48 EDT SpO2 97 %    Oxygen Therapy Room air      Course: improving.   Assessment: reexam performed, exam improved.   Notes: Discussed all findings with patient/family and voiced understanding.  Patient reports feeling much better  and is okay to be discharged home.  No acute issues since ED evaluation began. No acute findings to warrant further ED evaluation or admission at this time. Patient/family was encouraged to follow-up with his/her primary care physician and/or specialist for continuing management of today's complaints.      Impression and Plan   Diagnosis   Sickle cell crisis (ICD10-CM D57.00, Discharge, Medical)   Plan   Condition: Improved, Stable.    Disposition: Medically cleared, Discharged: Time  03/13/2019 17:45:00, to home.    Patient was given the following educational materials: Sickle Cell Anemia, Adult.    Follow up with: [name], View Only Providers - No Access Within 1 week Take all medications as prescribed. Drink plenty of fluids. Return to the ED immediately in the next 12-24 hours if symptoms worse in anyway or if any new symptoms develop. Arrange follow-up with your hematologist for continuing management of today's complaints and for chronic medical issues    If you do not have a primary care call [phone] to arrange for a primary care physician     Please read all instructions as there may be items that were not fully discussed during your ED evaluation including all diagnosis and prescription medication instructions..     Counseled: Patient, Regarding diagnosis, Regarding diagnostic results, Regarding treatment plan, Regarding prescription, Patient indicated understanding of instructions.    Notes: I have spoken with the patient and/or caregivers. I have explained the patient's condition, diagnoses and treatment plan based on the information available to me at this time. I have answered the patient's and/or caregiver's questions and addressed any concerns. The patient and/or caregivers have as good an understanding of the patient's diagnosis, condition and treatment plan as can be expected at this point. The vital signs have been stable. The patient's condition is stable and appropriate for discharge from the emergency department. The patient will pursue further outpatient evaluation with the primary care physician or other designated or consulting physician as outlined in the discharge instructions. The patient and/or caregivers are agreeable to this plan of care and follow-up instructions have been explained in detail. The patient and/or caregivers have received these instructions in written format and have expressed an understanding of the discharge instructions. The patient and/or caregivers are aware that any significant change in condition or worsening of symptoms should prompt an immediate return to this or the closest emergency department or a call to 911.    Games developer Signed on 03/13/2019 05:34 PM EDT   ________________________________________________   NORRIS-MD,  JUSTIN AARON               Modified by: NORRIS-MD,  JUSTIN AARON on 03/13/2019 04:47 PM EDT      Modified by: NORRIS-MD,  JUSTIN AARON on 03/13/2019 05:01 PM EDT      Modified by: NORRIS-MD,  JUSTIN AARON on 03/13/2019 05:34 PM EDT

## 2019-03-13 NOTE — Discharge Summary (Signed)
 ED Clinical Summary                     [facility]  [address]  [place], [place] [address]  [phone]          PERSON INFORMATION  Name: Judith [name], Judith [name] Age:  33 Years DOB: Dec 01, 1985   Sex: Female Language: English PCP: PCP,  NONE   Marital Status: Single Phone: [phone] Med Service: MED-Medicine   MRN: [id number] Acct# [id number] Arrival: 03/13/2019 14:46:00   Visit Reason: Sickle cell pain; SICKLE CELL COMPLICATIONS Acuity: 3 LOS: 000 03:32   Address:    [address] [place] [address]   Diagnosis:    Sickle cell crisis  Medications:          Medications that have not changed  Other Medications  citalopram (CeleXA) Oral (given by mouth) every day.  Last Dose:____________________  clonazePAM (KlonoPIN) Oral (given by mouth) 3 times a day.  Last Dose:____________________  folic acid every day.  Last Dose:____________________  ibuprofen   Last Dose:____________________  mirtazapine (mirtazapine 30 mg oral tablet) 1 Tabs Oral (given by mouth) Once a Day (at bedtime).  Last Dose:____________________  oxyCODONE Oral (given by mouth).  Last Dose:____________________  promethazine (Phenergan)   Last Dose:____________________  promethazine (promethazine 25 mg oral tablet) 1 Tabs Oral (given by mouth) 3 times a day as needed nausea/vomiting. Refills: 0.  Last Dose:____________________  sodium bicarbonate (sodium bicarbonate oral) Oral (given by mouth) 4 times a day.  Last Dose:____________________      Medications Administered During Visit:                Medication Dose Route   Sodium Chloride 0.9% 2000 mL IV Piggyback   diphenhydrAMINE 25 mg IV Push   promethazine 12.5 mg IV   hydromorphone 2 mg IV Push   hydromorphone 4 mg IV Push   ondansetron 8 mg IV Push   diphenhydrAMINE 25 mg IV Push   hydromorphone 2 mg IV Push   heparin flush 500 units Intracath               Allergies      Rocephin      fentaNYL      morphine      contrast media (iodine-based)      Major Tests and  Procedures:  The following procedures and tests were performed during your ED visit.  COMMON PROCEDURES%>  COMMON PROCEDURES COMMENTS%>                PROVIDER INFORMATION               Provider Role Assigned Sampson Drivers, RN, [place] A ED Nurse 03/13/2019 14:56:09    NORRIS-MD, JUSTIN AARON ED Provider 03/13/2019 14:56:34        Attending Physician:  NORRIS-MD,  JUSTIN AARON      Admit Doc  NORRIS-MD,  JUSTIN AARON     Consulting Doc       VITALS INFORMATION  Vital Sign Triage Latest   Temp Oral ORAL_1%> ORAL%>   Temp Temporal TEMPORAL_1%> TEMPORAL%>   Temp Intravascular INTRAVASCULAR_1%> INTRAVASCULAR%>   Temp Axillary AXILLARY_1%> AXILLARY%>   Temp Rectal RECTAL_1%> RECTAL%>   02 Sat 97 % 98 %   Respiratory Rate RATE_1%> RATE%>   Peripheral Pulse Rate PULSE RATE_1%> PULSE RATE%>   Apical Heart Rate HEART RATE_1%> HEART RATE%>   Blood Pressure BLOOD PRESSURE_1%>/ BLOOD PRESSURE_1%>102 mmHg BLOOD PRESSURE%> /  BLOOD PRESSURE%>84 mmHg                 Immunizations      No Immunizations Documented This Visit          DISCHARGE INFORMATION   Discharge Disposition: H Outpt-Sent Home   Discharge Location:  Home   Discharge Date and Time:  03/13/2019 18:18:15   ED Checkout Date and Time:  03/13/2019 18:18:15     DEPART REASON INCOMPLETE INFORMATION               Depart Action Incomplete Reason   Interactive View/I&O Recently assessed               Problems      Active           Hypertension          Sickle cell              Smoking Status      Former smoker         PATIENT EDUCATION INFORMATION  Instructions:     Sickle Cell Anemia, Adult     Follow up:                   With: Address: When:   TEMEIA MARTIN, View Only Providers - No Access [address] [place], [place] [address]  [phone] Business (1) Within 1 week   Comments:   Take all medications as prescribed. Drink plenty of fluids. Return to the ED immediately in the next 12-24 hours if symptoms worse in anyway or if any new symptoms develop. Arrange follow-up  with your hematologist for continuing management of today's complaints and for chronic medical issues     If you do not have a primary care call [phone] to arrange for a primary care physician     Please read all instructions as there may be items that were not fully discussed during your ED evaluation including all diagnosis and prescription medication instructions.              ED PROVIDER DOCUMENTATION     Patient:   Judith [name], Judith [name]            MRN: [id number]            FIN: [id number]               Age:   33 years     Sex:  Female     DOB:  June 24, 1986   Associated Diagnoses:   Sickle cell crisis   Author:   NORRIS-MD,  JUSTIN AARON      Basic Information   Time seen: Provider Seen (ST)   ED Provider/Time:    NORRIS-MD,  JUSTIN AARON / 03/13/2019 14:56  .   History source: Patient.   Arrival mode: Private vehicle.   History limitation: None.   Additional information: Chief Complaint from Nursing Triage Note   Chief Complaint  Chief Complaint: states sickle cell pain in bilateral hips, trunk and bilateral arms x 3 days (03/13/19 14:48:00).      History of Present Illness   The patient presents with sickle cell crisis and sickle cell pain.  The onset was 3  days ago.  The course/duration of symptoms is constant.  Location: Trunk upper extremity lower extremity. The character of symptoms is dull.  The degree of pain is severe.  The exacerbating factor is Temperature change.  The relieving factor is none.  Risk factors  consist of hypertension.  Prior episodes: similar to sickle cell pain.  Therapy today: none.  Associated symptoms: Palpitations, headache.  Patient presents to the ED with complaints of sickle cell pain that is been going on for the past several days.  Patient reports history of similar complaints with her sickle cell crises.  Patient reports that she sees Dr. Pamila Lunger with [facility] who usually takes care of her.  She reports that she was last seen over 2 weeks ago for pain management.  Patient denies  any other complaints at this time except for above.  She reports no recent sick contacts that she is aware of, fever, chest pain, or shortness of breath.        Review of Systems   Constitutional symptoms:  No fever, no chills, no fatigue.    Skin symptoms:  No rash, no dryness.    Eye symptoms:  Vision unchanged.   ENMT symptoms:  No nasal congestion, no sinus pain.    Respiratory symptoms:  No shortness of breath, no cough.    Cardiovascular symptoms:  No chest pain, no tachycardia, no syncope.    Gastrointestinal symptoms:  No abdominal pain, no nausea, no vomiting, no diarrhea, no constipation.    Genitourinary symptoms:  No dysuria, no hematuria.    Musculoskeletal symptoms:  No back pain, no Joint pain.    Neurologic symptoms:  No dizziness, no altered level of consciousness, no focal weakness.    Psychiatric symptoms:  No anxiety, no depression.    Hematologic/Lymphatic symptoms:  Bleeding tendency negative, no petechiae.              Additional review of systems information: All other systems reviewed and otherwise negative.      Health Status   Allergies:    Allergic Reactions (Selected)  Unknown  Contrast media (iodine-based)- No reactions were documented.  FentaNYL- No reactions were documented.  Morphine- No reactions were documented.  Rocephin- No reactions were documented..   Medications:  (Selected)   Inpatient Medications  Ordered  Benadryl: 25 mg, 0.5 mL, IV Push, Once  Dilaudid: 2 mg, IV Push, Once  Phenergan IV - High Alert: 12.5 mg, 0.5 mL, IV, Once  Sodium Chloride 0.9% bolus: 2,000 mL, IV Piggyback, Once  Prescriptions  Prescribed  promethazine 25 mg oral tablet: 25 mg, 1 tabs, Oral, TID, PRN: nausea/vomiting, 15 tabs, 0 Refill(s)  Documented Medications  Documented  CeleXA: Oral, Daily, 0 Refill(s)  KlonoPIN: Oral, TID, 0 Refill(s)  Phenergan: 0 Refill(s)  folic acid: Daily, 0 Refill(s)  ibuprofen: 0 Refill(s)  mirtazapine 30 mg oral tablet: 30 mg, 1 tabs, Oral, Once a Day (at bedtime), 30  tabs, 0 Refill(s)  oxyCODONE: Oral, 0 Refill(s)  sodium bicarbonate oral: mg, Oral, QID, 0 Refill(s).   Immunizations: Up to date.      Past Medical/ Family/ Social History   Medical history:    No active or resolved past medical history items have been selected or recorded., Reviewed as documented in chart.   Surgical history:    Port ([id number])., Reviewed as documented in chart.   Family history:    No family history items have been selected or recorded., Reviewed as documented in chart.   Social history:    Social & Psychosocial Habits    Tobacco  11/08/2015  Use: Former smoker  , Reviewed as documented in chart.   Problem list:    Active Problems (2)  Hypertension   Sickle cell   , per nurse's  notes.      Physical Examination               Vital Signs   Vital Signs   03/13/2019 14:48 EDT Systolic Blood Pressure 129 mmHg    Diastolic Blood Pressure 102 mmHg  >HHI    Temperature Oral 36.4 degC    Heart Rate Monitored 102 bpm  HI    Respiratory Rate 16 br/min    SpO2 97 %    Measurements   03/13/2019 14:54 EDT Body Mass Index est meas 21.29 kg/m2    Body Mass Index Measured 21.29 kg/m2   03/13/2019 14:48 EDT Height/Length Measured 160 cm    Weight Dosing 54.5 kg    Basic Oxygen Information   03/13/2019 14:48 EDT SpO2 97 %    Oxygen Therapy Room air    General:  Alert, mild distress.    Skin:  Warm, dry, intact.    Head:  Normocephalic, atraumatic.    Neck:  Supple, trachea midline, no tenderness, no JVD.    Eye:  Pupils are equal, round and reactive to light, extraocular movements are intact.    Ears, nose, mouth and throat:  Oral mucosa moist.   Cardiovascular:  No murmur, Normal peripheral perfusion, No edema, Tachycardia.    Respiratory:  Lungs are clear to auscultation, respirations are non-labored, breath sounds are equal, Symmetrical chest wall expansion.    Chest wall:  No tenderness, No deformity.    Back:  Nontender, Normal range of motion.    Musculoskeletal:  Normal ROM, normal strength, no tenderness, no  swelling, no deformity.    Gastrointestinal:  Soft, Nontender, Non distended, Normal bowel sounds, No organomegaly.    Genitourinary   Neurological:  Alert and oriented to person, place, time, and situation, No focal neurological deficit observed, CN II-XII intact.    Lymphatics:  No lymphadenopathy.   Psychiatric:  Cooperative, appropriate mood & affect.       Medical Decision Making   Differential Diagnosis::  Sickle cell crisis.   Rationale:  03/13/2019 15:07:16: Based on the patient's presentation and history of present illness, patient will have laboratory analysis and imaging studies for further management of today's complaints.  Patient will be monitored for any signs of deterioration and we will adjust treatments accordingly.   Documents reviewed:  Emergency department nurses' notes.   Orders  Launch Order Profile (Selected)   Inpatient Orders  Ordered  Benadryl: 25 mg, 0.5 mL, IV Push, Once  Dilaudid: 2 mg, IV Push, Once  NIBP/Pulse Ox Monitoring:   POC-Urine Pregnancy Test collect:   Phenergan IV - High Alert: 12.5 mg, 0.5 mL, IV, Once  Saline Lock Insert:   Sodium Chloride 0.9% bolus: 2,000 mL, IV Piggyback, Once  Ordered (Dispatched)  CBC Automated, With Diff:   CMP:   Retic Count Auto: .   Results review:  Lab results : Lab View   03/13/2019 16:11 EDT Estimated Creatinine Clearance 82.71 mL/min   03/13/2019 15:42 EDT WBC 14.5 x10e3/mcL  HI    RBC 2.61 x10e6/mcL  LOW    RBC 2.61 x10e6/mcL  LOW    Hgb 7.2 g/dL  LOW    HCT 79.1 %  CRIT    MCV 79.7 fL  LOW    MCH 27.6 pg    MCHC 34.6 g/dL    RDW 81.4 %  HI    Platelet 450 x10e3/mcL  HI    MPV 9.1 fL    Neutro Auto 62.9 %    Neutro  Absolute 9.2 x10e3/mcL  HI    Immature Grans Percent 0.6 %  NA    Immature Grans Absolute 0.1 x10e3/mcL  NA    Lymph Auto 25.5 %    Lymph Absolute 3.7 x10e3/mcL  HI    Mono Auto 9.3 %    Mono Absolute 1.4 x10e3/mcL  HI    Eosinophil Percent 1.2 %    Eos Absolute 0.2 x10e3/mcL    Basophil Auto 0.5 %    Baso Absolute 0.1 x10e3/mcL    NRBC  Absolute Auto 0.06 x10e3/mcL  NA    NRBC Percent Auto 0.4 %  NA    Retic Percent 7.7 %  HI    Absolute Retic 0.2020 /mcL  HI    Sodium Lvl 137 mmol/L    Potassium Lvl 3.8 mmol/L    Chloride 109 mmol/L  HI    CO2 12 mmol/L  LOW    Glucose Random 107 mg/dL  HI    BUN 13 mg/dL    Creatinine Lvl 0.8 mg/dL    AGAP 16 mmol/L    Osmolality Calc 274 mOsm/kg    Calcium Lvl 8.8 mg/dL    Protein Total 8.2 g/dL    Albumin Lvl 4.8 g/dL    Globulin Calc 3.4 g/dL    AG Ratio Calc 8.58 mmol/L    Alk Phos 84 unit/L    AST 21 unit/L    ALT 11 unit/L    eGFR AA 112 mL/min/1.49m    eGFR Non-AA 97 mL/min/1.76m    Bili Total 1.50 mg/dL  HI   02/05/7978 84:72 EDT Preg U POC NEGATIVE   03/13/2019 14:54 EDT Estimated Creatinine Clearance 110.28 mL/min       Reexamination/ Reevaluation   Time: 03/13/2019 16:45:00 .   Vital signs   Basic Oxygen Information   03/13/2019 14:48 EDT SpO2 97 %    Oxygen Therapy Room air      Course: improving.   Assessment: reexam performed, exam improved.   Notes: Discussed all findings with patient/family and voiced understanding.  Patient reports feeling much better and is okay to be discharged home.  No acute issues since ED evaluation began. No acute findings to warrant further ED evaluation or admission at this time. Patient/family was encouraged to follow-up with his/her primary care physician and/or specialist for continuing management of today's complaints.      Impression and Plan   Diagnosis   Sickle cell crisis (ICD10-CM D57.00, Discharge, Medical)   Plan   Condition: Improved, Stable.    Disposition: Medically cleared, Discharged: Time  03/13/2019 17:45:00, to home.    Patient was given the following educational materials: Sickle Cell Anemia, Adult.    Follow up with: [name], View Only Providers - No Access Within 1 week Take all medications as prescribed. Drink plenty of fluids. Return to the ED immediately in the next 12-24 hours if symptoms worse in anyway or if any new symptoms develop. Arrange  follow-up with your hematologist for continuing management of today's complaints and for chronic medical issues    If you do not have a primary care call [phone] to arrange for a primary care physician     Please read all instructions as there may be items that were not fully discussed during your ED evaluation including all diagnosis and prescription medication instructions..    Counseled: Patient, Regarding diagnosis, Regarding diagnostic results, Regarding treatment plan, Regarding prescription, Patient indicated understanding of instructions.    Notes: I have spoken with the patient and/or  caregivers. I have explained the patient's condition, diagnoses and treatment plan based on the information available to me at this time. I have answered the patient's and/or caregiver's questions and addressed any concerns. The patient and/or caregivers have as good an understanding of the patient's diagnosis, condition and treatment plan as can be expected at this point. The vital signs have been stable. The patient's condition is stable and appropriate for discharge from the emergency department. The patient will pursue further outpatient evaluation with the primary care physician or other designated or consulting physician as outlined in the discharge instructions. The patient and/or caregivers are agreeable to this plan of care and follow-up instructions have been explained in detail. The patient and/or caregivers have received these instructions in written format and have expressed an understanding of the discharge instructions. The patient and/or caregivers are aware that any significant change in condition or worsening of symptoms should prompt an immediate return to this or the closest emergency department or a call to 911.

## 2019-03-27 LAB — DIFFERENTIAL, MANUAL
Absolute Bands #: 1.8 10*3/uL
Absolute Eos #: 0.4 10*3/uL (ref 0.0–0.5)
Absolute Lymph #: 8.5 10*3/uL — ABNORMAL HIGH (ref 1.0–3.2)
Absolute Mono #: 1.1 10*3/uL — ABNORMAL HIGH (ref 0.3–1.0)
Bands Relative: 5 % (ref 0–5)
Eosinophils %: 1 % (ref 0–7)
Lymphocytes: 24 % (ref 15–45)
Monocytes: 3 % — ABNORMAL LOW (ref 4–12)
Neutrophils %. Manual count: 67 % (ref 42–74)
Neutrophils Absolute: 23.9 10*3/uL — ABNORMAL HIGH (ref 1.6–7.3)
Platelet Estimate: ADEQUATE
RBC Morphology: ABNORMAL — AB

## 2019-03-27 LAB — CBC WITH AUTO DIFFERENTIAL
Hematocrit: 17.4 % — CL (ref 34.0–47.0)
Hemoglobin: 5.9 g/dL — CL (ref 11.5–15.7)
MCH: 27.7 pg (ref 27.0–34.5)
MCHC: 33.9 g/dL (ref 32.0–36.0)
MCV: 81.7 fL (ref 81.0–99.0)
MPV: 9.4 fL (ref 7.2–13.2)
NRBC Absolute: 0.12 10*3/uL
NRBC Automated: 0.3 %
Platelets: 392 10*3/uL (ref 140–440)
RBC: 2.13 x10e6/mcL — ABNORMAL LOW (ref 3.60–5.20)
RDW: 18.4 % — ABNORMAL HIGH (ref 11.0–16.0)
WBC: 35.6 10*3/uL — ABNORMAL HIGH (ref 3.8–10.6)

## 2019-03-27 LAB — ADD ON LAB TEST

## 2019-03-27 LAB — COMPREHENSIVE METABOLIC PANEL
ALT: 17 U/L (ref 0–33)
AST: 20 U/L (ref 0–32)
Albumin/Globulin Ratio: 1.95 mmol/L (ref 1.00–2.00)
Albumin: 4.3 g/dL (ref 3.5–5.2)
Alk Phosphatase: 96 U/L (ref 35–117)
Anion Gap: 12 mmol/L (ref 2–17)
BUN: 8 mg/dL (ref 6–20)
CO2: 14 mmol/L — ABNORMAL LOW (ref 22–29)
Calcium: 8.6 mg/dL (ref 8.6–10.0)
Chloride: 115 mmol/L — ABNORMAL HIGH (ref 98–107)
Creatinine: 0.5 mg/dL (ref 0.5–0.9)
GFR African American: 147 mL/min/{1.73_m2} (ref >=90)
GFR Non-African American: 127 mL/min/{1.73_m2} (ref >=90)
Globulin: 2.2 g/dL (ref 1.9–4.4)
Glucose: 113 mg/dL — ABNORMAL HIGH (ref 70–99)
Potassium: 3.4 mmol/L — ABNORMAL LOW (ref 3.5–5.3)
Sodium: 141 mmol/L (ref 135–145)
Total Bilirubin: 1.3 mg/dL — ABNORMAL HIGH (ref 0.00–1.20)
Total Protein: 6.5 g/dL (ref 6.4–8.3)

## 2019-03-27 LAB — COVID-19: SARS-CoV-2: NOT DETECTED

## 2019-03-27 LAB — ABO/RH: ABO/Rh: O POS

## 2019-03-27 LAB — LACTIC ACID: Lactic Acid: 0.8 mmol/L (ref 0.5–2.0)

## 2019-03-27 LAB — ANTIBODY SCREEN: Antibody Screen: NEGATIVE

## 2019-03-27 NOTE — ED Provider Notes (Signed)
Fever *ED        Patient:   Judith [name], Judith [name]            MRN: [id number]            FIN: [id number]               Age:   33 years     Sex:  Female     DOB:  06-06-1986   Associated Diagnoses:   Fever; Sickle cell pain crisis   Author:   Crecencio Mc      Basic Information   Time seen: Provider Seen (ST)   ED Provider/Time:    Crecencio Mc / 03/27/2019 14:15  .   History source: Patient.   Arrival mode: Private vehicle.   History limitation: None.   Additional information: Chief Complaint from Nursing Triage Note   Chief Complaint  Chief Complaint: fever body pain n/v, pain started Friday (03/27/19 14:00:00).      History of Present Illness   33 year old female with sickle cell disease presents emergency department complaining of fever, body aches, nausea, vomiting.  Patient states her symptoms started Friday and have not improved.  She has a history of acute chest syndrome in the past and states that this feels similar.  She has had some cough.  She denies any exposure to COVID-19.        Review of Systems   Constitutional symptoms:  Negative except as documented in HPI.   Skin symptoms:  No rash,    Eye symptoms:  Vision unchanged.   ENMT symptoms:  No sore throat, no nasal congestion.    Respiratory symptoms:  Negative except as documented in HPI.   Cardiovascular symptoms:  Negative except as documented in HPI.   Gastrointestinal symptoms:  Negative except as documented in HPI.   Genitourinary symptoms:  No dysuria, no hematuria.    Musculoskeletal symptoms:  Negative except as documented in HPI.   Neurologic symptoms:  No headache,              Additional review of systems information: All other systems reviewed and otherwise negative.      Health Status   Allergies:    Allergic Reactions (Selected)  Unknown  Contrast media (iodine-based)- No reactions were documented.  FentaNYL- No reactions were documented.  Morphine- No reactions were documented.  Rocephin- No reactions were documented..   Medications:   (Selected)   Prescriptions  Prescribed  promethazine 25 mg oral tablet: 25 mg, 1 tabs, Oral, TID, PRN: nausea/vomiting, 15 tabs, 0 Refill(s)  Documented Medications  Documented  CeleXA: Oral, Daily, 0 Refill(s)  KlonoPIN: Oral, TID, 0 Refill(s)  Phenergan: 0 Refill(s)  folic acid: Daily, 0 Refill(s)  ibuprofen: 0 Refill(s)  mirtazapine 30 mg oral tablet: 30 mg, 1 tabs, Oral, Once a Day (at bedtime), 30 tabs, 0 Refill(s)  oxyCODONE: Oral, 0 Refill(s)  sodium bicarbonate oral: mg, Oral, QID, 0 Refill(s).      Past Medical/ Family/ Social History   Medical history: Reviewed as documented in chart.   Surgical history: Reviewed as documented in chart.   Family history: Not significant.   Social history: Reviewed as documented in chart.   Problem list:    Active Problems (2)  Hypertension   Sickle cell   , per nurse's notes.      Physical Examination               Vital Signs   Vital Signs  1/76/1607 37:10 EDT Systolic Blood Pressure 626 mmHg    Diastolic Blood Pressure 79 mmHg    Heart Rate Monitored 104 bpm  HI    Respiratory Rate 18 br/min    SpO2 98 %   9/48/5462 70:35 EDT Systolic Blood Pressure 009 mmHg    Diastolic Blood Pressure 79 mmHg    Temperature Oral 37.9 degC  HI    Heart Rate Monitored 115 bpm  HI    Respiratory Rate 20 br/min    SpO2 96 %   03/27/2019 14:13 EDT Heart Rate Monitored 109 bpm  HI    SpO2 99 %   3/81/8299 37:16 EDT Systolic Blood Pressure 967 mmHg    Diastolic Blood Pressure 58 mmHg  LOW    Heart Rate Monitored 114 bpm  HI    Respiratory Rate 24 br/min  HI    SpO2 98 %   8/93/8101 75:10 EDT Systolic Blood Pressure 88 mmHg  LOW    Diastolic Blood Pressure 74 mmHg    Temperature Oral 38.3 degC  HI    Heart Rate Monitored 134 bpm  >HHI    Respiratory Rate 24 br/min  HI    SpO2 98 %    Measurements   03/27/2019 14:03 EDT Body Mass Index est meas 23.19 kg/m2   03/27/2019 14:03 EDT Body Mass Index Measured 23.19 kg/m2   03/27/2019 14:00 EDT Height/Length Measured 154 cm    Weight Dosing 55 kg    Basic  Oxygen Information   03/27/2019 17:00 EDT SpO2 98 %    Oxygen Therapy Room air   03/27/2019 16:17 EDT SpO2 96 %    Oxygen Therapy Room air   03/27/2019 14:13 EDT SpO2 99 %    Oxygen Therapy Room air   03/27/2019 14:11 EDT SpO2 98 %    Oxygen Therapy Room air   03/27/2019 14:00 EDT SpO2 98 %    Oxygen Therapy Room air    General:  Alert, moderate distress, ill-appearing, crying, grimacing.    Skin:  Warm, dry, intact.    Head:  Normocephalic, atraumatic.    Neck:  Supple, trachea midline.    Eye:  Extraocular movements are intact, normal conjunctiva.    Ears, nose, mouth and throat:  Oral mucosa moist.   Cardiovascular:  Normal peripheral perfusion, Tachycardia.    Respiratory:  Lungs are clear to auscultation, Respirations: Tachypneic.    Chest wall:  No tenderness.   Back:  Normal range of motion.   Musculoskeletal:  Normal ROM, no deformity.    Gastrointestinal:  Soft, Nontender, Non distended.    Neurological:  Alert and oriented to person, place, time, and situation, No focal neurological deficit observed.       Medical Decision Making   Rationale:  Patient was tested for COVID which was negative.  Chest x-ray did not reveal any infiltrate which rules out acute chest.  Patient's urine was still pending at the time of admission.  Patient has a port so blood infection is certainly concerning.  She had some symptomatic provement with treatment in the ED.  The patient was found to be significantly anemic and will require transfusion.  She also had a significant elevated white blood cell count..   Documents reviewed:  Emergency department nurses' notes.   Electrocardiogram:  Emergency Provider interpretation performed by me, rate 106, No ST-T changes, no ectopy, normal PR & QRS intervals, The Rhythm is sinus tachycardia.  .    Results review:  Lab results : Lab View  03/27/2019 22:16 EDT TRANSFUSED TRANSFUSED   03/27/2019 21:52 EDT UA Color Yellow    UA Appear Clear    UA Glucose Negative    UA Bili Negative    UA Ketones  Negative    UA Spec Grav 1.015    UA Blood Trace    UA pH 6.5    Protein U 30    UA Urobilinogen 0.2 EU/dL    UA Nitrite Negative    UA Leuk Est Small    WBC U 0-2 /HPF    RBC U 0-2 /HPF    Bacteria U Rare   03/27/2019 19:57 EDT Adenovirus. Not Detected    Coronavirus 229E Not Detected    Coronavirus HKU1 Not Detected    Coronavirus NL63 Not Detected    Coronavirus OC43 Not Detected    Human Metapneumo Not Detected    Human Rhino Entero Not Detected    Influenza A Not Detected    Influenza B Not Detected    Parainfluenza 1 Not Detected    Parainfluenza 2 Not Detected    Parainfluenza 3 Not Detected    Parainfluenza 4 Not Detected    Resp Syncytial Not Detected    Bordetella pertusis Not Detected    Chlamydia pneumoniae Not Detected    Mycoplas pneumoniae Not Detected   03/27/2019 16:04 EDT Estimated Creatinine Clearance 118.61 mL/min   03/27/2019 16:03 EDT EABORh Interp O POS    ECABS Interp Negative    XMAHG Interp Compatible    XMAHG Interp Compatible   03/27/2019 15:43 EDT Product Ready Done   03/27/2019 15:20 EDT Procalcitonin 0.15 ng/mL   03/27/2019 14:59 EDT WBC 35.6 x10e3/mcL  HI    RBC 2.13 x10e6/mcL  LOW    Hgb 5.9 g/dL  CRIT    HCT 17.4 %  CRIT    MCV 81.7 fL    MCH 27.7 pg    MCHC 33.9 g/dL    RDW 18.4 %  HI    Platelet 392 x10e3/mcL    MPV 9.4 fL    NRBC Absolute Auto 0.12 x10e3/mcL  NA    NRBC Percent Auto 0.3 %  NA    Neutrophil M Pct 67 %    Bands M Pct 5 %    Lymphcyte M Pct 24 %    Monocyte M Pct 3 %  LOW    Eosinophil M Pct 1 %    Neutrophil M # 23.9 x10e3/mcL  HI    Bands M # 1.8 x10e3/mcL  NA    Lymphocyte M # 8.5 x10e3/mcL  HI    Monocyte M # 1.1 x10e3/mcL  HI    Eosinophil M # 0.4 x10e3/mcL    Elliptocytes Few    Hypochromia Few    PLT estimate Adequate    Polychromasia Few    RBC morphology Abnormal    Target cells Few    Tear drop Few    Sodium Lvl 141 mmol/L    Potassium Lvl 3.4 mmol/L  LOW    Chloride 115 mmol/L  HI    CO2 14 mmol/L  LOW    Glucose Random 113 mg/dL  HI    BUN 8 mg/dL     Creatinine Lvl 0.5 mg/dL    AGAP 12 mmol/L    Calcium Lvl 8.6 mg/dL    Protein Total 6.5 g/dL    Albumin Lvl 4.3 g/dL    Globulin Calc 2.2 g/dL    AG Ratio Calc 1.95 mmol/L    Alk Phos  96 unit/L    AST 20 unit/L    ALT 17 unit/L    eGFR AA 147 mL/min/1.67m???    eGFR Non-AA 127 mL/min/1.722m??    Bili Total 1.30 mg/dL  HI    Lactic Acid Lvl 0.8 mmol/L    Corona COVID-19 SARS Not Detected   03/27/2019 14:03 EDT Estimated Creatinine Clearance 74.13 mL/min     .   Chest X-Ray:  FINAL REPORT  Chest AP: 03/27/19    INDICATION: "Fever".    COMPARISON: 11/03/2018 radiograph    FINDINGS: Right IJ porta catheter, stable. Cardiac and mediastinal contours are  stable with prominence of the cardiac silhouette. Mild diffuse coarse  reticular/interstitial markings are grossly stable from prior study. No definite  superimposed acute process. No pneumothorax or large effusion. No acute osseous  abnormality.    IMPRESSION:  Stable chest without acute abnormality.    Signature Line  ***** Final *****      Releasing Radiologist:   BAElyse JarvisReleased Date and Time:  03/27/19 15:18.      Reexamination/ Reevaluation   Time: 03/27/2019 17:00:00 .   Notes: Per discussion with the admitting attending the decision was made to hold off on antibiotics until the patient is able to give a urine sample.  The patient's lactic acid was negative and her vital signs are improving.  We are in agreement that this is the best course of action to get a good urine culture from this patient.  The admitting attending will start antibiotics shortly.      Impression and Plan   Diagnosis   Fever (ICD10-CM R50.9, Discharge, Medical)   Sickle cell pain crisis (ICD10-CM D57.00, Discharge, Medical)   Plan   Condition: Stable.    Disposition: Admit time  03/27/2019 17:04:00, Admit to Inpatient Unit.    Counseled: Patient, Regarding diagnosis, Regarding diagnostic results, Regarding treatment plan, Patient indicated understanding of instructions.    SiPatent attorneyigned on 03/28/2019 12:37 AM EDT   ________________________________________________   YOCrecencio Mc    Electronically Signed on 03/28/2019 11:16 PM EDT   ________________________________________________   YOCrecencio Mc    Electronically Signed on 04/11/2019 08:11 PM EDT   ________________________________________________   YOCrecencio Mc          Modified by: YOCrecencio Mcn 03/27/2019 05:08 PM EDT      Modified by: YOCrecencio Mcn 03/28/2019 12:37 AM EDT      Modified by: YOCrecencio Mcn 03/28/2019 11:16 PM EDT      Modified by: YOCrecencio Mcn 04/11/2019 08:11 PM EDT      Reviewed by: YOCrecencio Mc    Reviewed by: YOCrecencio Mc

## 2019-03-27 NOTE — ED Notes (Signed)
ED Triage Note       ED Triage Adult Entered On:  03/27/2019 14:03 EDT    Performed On:  03/27/2019 14:00 EDT by Gentry RochJudd, RN, Kristy               Triage   Chief Complaint :   fever body pain n/v, pain started Friday   Numeric Rating Pain Scale :   10 = Worst possible pain   [place] Mode of Arrival :   Wheelchair   Infectious Disease Documentation :   Document assessment   Temperature Oral :   38.3 degC(Converted to: 100.9 degF)  (HI)    Heart Rate Monitored :   134 bpm (>HHI)    Respiratory Rate :   24 br/min (HI)    Systolic Blood Pressure :   88 mmHg (LOW)    Diastolic Blood Pressure :   74 mmHg   SpO2 :   98 %   Oxygen Therapy :   Room air   FlorenceJudd, CaliforniaRN, Wilkie AyeKristy - 03/27/2019 14:00 EDT   Patient presentation :   SBP </= 90 mmHg, Fever >/= 38.0 C or </= 36.0 C, HR > 100, RR > 22   Violet HillJudd, RN, Wilkie AyeKristy - 03/27/2019 14:09 EDT     Chief Complaint or Presentation suggest infection :   Yes   Weight Dosing :   55 kg(Converted to: 121 lb 4 oz)    Height :   154 cm(Converted to: 5 ft 1 in)    Body Mass Index Dosing :   23 kg/m2   Sela HuaJudd, RN, Kristy - 03/27/2019 14:00 EDT   Gentry RochJudd, RN, Wilkie AyeKristy - 03/27/2019 14:00 EDT   DCP GENERIC CODE   Tracking Group :   ED [address] [employer]   GlenwoodJudd, CaliforniaRN, RoselandKristy - 03/27/2019 14:00 EDT   Tracking Acuity :   2   Evlyn CourierKesling, Rn, Bridgette D - 03/27/2019 15:34 EDT     ED General Section :   Document assessment   Pregnancy Status :   Patient denies   ED Allergies Section :   Document assessment   ED Reason for Visit Section :   Document assessment   Sela HuaJudd, RN, Kristy - 03/27/2019 14:00 EDT   ID Risk Screen Symptoms   Recent Travel History :   No recent travel   Close Contact with COVID-19  ID :   No   Have you been tested for COVID-19 ID :   No   Gentry RochJudd, RN, Wilkie AyeKristy - 03/27/2019 14:00 EDT   Allergies   (As Of: 03/27/2019 14:03:21 EDT)   Allergies (Active)   contrast media (iodine-based)  Estimated Onset Date:   Unspecified ; Created ByRock Nephew:   ALBAYALDE, RN, Ricke HeyJENNETTE; Reaction Status:   Active ; Category:   Drug ; Substance:    contrast media (iodine-based) ; Type:   Allergy ; Severity:   Unknown ; Updated By:   Irene LimboALBAYALDE, RN, JENNETTE; Reviewed Date:   03/13/2019 15:07 EDT      fentaNYL  Estimated Onset Date:   Unspecified ; Created By:   Rock NephewALBAYALDE, RN, JENNETTE; Reaction Status:   Active ; Category:   Drug ; Substance:   fentaNYL ; Type:   Allergy ; Severity:   Unknown ; Updated By:   Irene LimboALBAYALDE, RN, JENNETTE; Reviewed Date:   03/13/2019 15:07 EDT      morphine  Estimated Onset Date:   Unspecified ; Created ByRock Nephew:   ALBAYALDE, RN, JENNETTE; Reaction Status:   Active ; Category:  Drug ; Substance:   morphine ; Type:   Allergy ; Severity:   Unknown ; Updated By:   Irene Limbo; Reviewed Date:   03/13/2019 15:07 EDT      Rocephin  Estimated Onset Date:   Unspecified ; Created By:   Rock Nephew RN, JENNETTE; Reaction Status:   Active ; Category:   Drug ; Substance:   Rocephin ; Type:   Allergy ; Severity:   Unknown ; Updated By:   Irene Limbo; Reviewed Date:   03/13/2019 15:07 EDT        Psycho-Social   Last 3 mo, thoughts killing self/others :   Patient denies   Sela Hua - 03/27/2019 14:00 EDT   ED Reason for Visit   (As Of: 03/27/2019 14:03:21 EDT)   Problems(Active)    Hypertension (IMO  :88757 )  Name of Problem:   Hypertension ; Recorder:   Rock Nephew, RN, JENNETTE; Confirmation:   Confirmed ; Classification:   Medical ; Code:   [phone] ; Contributor System:   Dietitian ; Last Updated:   11/08/2015 5:36 EST ; Life Cycle Date:   11/08/2015 ; Life Cycle Status:   Active ; Vocabulary:   IMO        Sickle cell (SNOMED CT  :06015615 )  Name of Problem:   Sickle cell ; Recorder:   ALBAYALDE, RN, JENNETTE; Confirmation:   Confirmed ; Classification:   Medical ; Code:   [id number] ; Contributor System:   Dietitian ; Last Updated:   11/08/2015 5:36 EST ; Life Cycle Date:   11/08/2015 ; Life Cycle Status:   Active ; Vocabulary:   SNOMED CT          Diagnoses(Active)    Generalized body aches  Date:   03/27/2019 ; Diagnosis Type:   Reason For  Visit ; Confirmation:   Complaint of ; Clinical Dx:   Generalized body aches ; Classification:   Medical ; Clinical Service:   Emergency medicine ; Code:   PNED ; Probability:   0 ; Diagnosis Code:   [id number]

## 2019-03-27 NOTE — ED Notes (Signed)
 ED Patient Summary              [facility] Emergency Department  [address] [place] [address]  [phone]  Discharge Instructions (Patient)  _______________________________________     Name: Judith [name], Judith [name]  DOB: May 31, 1986                   MRN: [id number]                   FIN: [phone]  Reason For Visit: Generalized body aches; SICKLECELL  Final Diagnosis: Fever; Sickle cell pain crisis     Visit Date: 03/27/2019 13:53:00  Address: [address] [place] [address]  Phone: [phone]     Primary Care Provider:      Name: PCP,  NONE      Phone:         Emergency Department Providers:         Primary Physician:   DENSON ANDREZ CHRISTELLA Shelvy Gwenn Lionel would like to thank you for allowing us  to assist you with your healthcare needs. The following includes patient education materials and information regarding your injury/illness.     Follow-up Instructions: You were treated today on an emergency basis, it may be wise to contact your primary care provider to notify them of your visit today. You may have been referred to your regular doctor or a specialist, please follow up as instructed. If your condition worsens or you can't get in to see the doctor, contact the Emergency Department.       Printed Prescriptions:    Patient Education Materials:  Discharge Orders       Comment:             Allergy Info: fentaNYL; contrast media (iodine-based); Rocephin; morphine     Medication Information:  [facility] ED Physicians provided you with a complete list of medications post discharge, if you have been instructed to stop taking a medication please ensure you also follow up with this information to your Primary Care Physician.  Unless otherwise noted, patient will continue to take medications as prescribed prior to the Emergency Room visit.  Any specific questions regarding your chronic medications and dosages should be discussed with your physician(s) and pharmacist.           citalopram (CeleXA) Oral (given by mouth) every day.  clonazePAM (KlonoPIN) 0.5 Milligram Oral (given by mouth) 3 times a day as needed anxiety.  fentaNYL (fentaNYL 50 mcg/hr transdermal film, extended release) 1 Patches Topical (on the skin) every 72 hours.  folic acid 1 Milligram Oral (given by mouth) every day.  ibuprofen  mirtazapine (mirtazapine 30 mg oral tablet) 1 Tabs Oral (given by mouth) Once a Day (at bedtime).  oxyCODONE 30 Milligram Oral (given by mouth) every 8 hours as needed moderate pain (4-7).  promethazine (Phenergan)  promethazine (promethazine 25 mg oral tablet) 1 Tabs Oral (given by mouth) 3 times a day as needed nausea/vomiting. Refills: 0.  sodium bicarbonate (sodium bicarbonate oral) Oral (given by mouth) 4 times a day.      Medications Administered During Visit:              Medication Dose Route   Sodium Chloride 0.9% 1650 mL IV   acetaminophen 1000 mg Oral   hydromorphone 1 mg IV Push   hydromorphone 2 mg IV Push   promethazine 12.5 mg IV Push  diphenhydrAMINE 25 mg IV Push   Sodium Chloride 0.9% 500 mL IV Piggyback          Major Tests and Procedures:  The following procedures and tests were performed during your ED visit.  COMMON PROCEDURES%>  COMMON PROCEDURES COMMENTS%>          Laboratory Orders  Name Status Details   C Blood Ordered Blood, Stat, ST - Stat, 03/27/19 14:39:00 EDT, Once, 03/27/19 16:03:00 EDT, Nurse collect, RIGHT CHEST WALL, Second blood culture   C Blood Ordered Blood, Stat, ST - Stat, 03/27/19 14:39:00 EDT, Once, 03/27/19 14:59:00 EDT, Nurse collect, RIGHT CHEST WALL, First blood culture   C Urine Ordered Urine, Clean Catch, Stat, ST - Stat, 03/27/19 14:39:00 EDT, Once, 03/27/19 14:39:00 EDT, Nurse collect, Print label Y/N   CBCDIFF Completed Blood, Stat, ST - Stat, 03/27/19 14:39:00 EDT, 03/27/19 14:39:00 EDT, Nurse collect, DENSON SPANNER M, Print label Y/N   CMP Completed Blood, Stat, ST - Stat, 03/27/19 14:39:00 EDT, 03/27/19 14:39:00 EDT, Nurse  collect, DENSON SPANNER HERO, Print label Y/N   Corona SARs Completed Nasopharyngeal Swab, Stat, ST - Stat, 03/27/19 14:39:00 EDT, 03/27/19 14:39:00 EDT, Nurse collect, DENSON SPANNER M, Print label Y/N   Crossmatch Ordered Blood, Routine, RT - Routine, 03/27/19 15:43:00 EDT, Nurse collect, Print label Y/N   Diff Man Completed Blood, Stat, ST - Stat, 03/27/19 14:39:00 EDT, 03/27/19 14:39:00 EDT, Nurse collect, 03/27/19 14:59:00 US Robinette DENSON SPANNER HERO, [phone]   Lactic Completed Blood, Routine, RT - Routine, 03/27/19 15:39:00 EDT, 03/27/19 15:39:00 EDT, Nurse collect, DENSON SPANNER M, Print label Y/N   Lactic Ordered Blood, Stat, ST - Stat, 03/27/19 14:39:00 EDT, 03/27/19 14:39:00 EDT, Nurse collect, DENSON SPANNER M, Print label Y/N   PABORh Ordered Blood, Routine, RT - Routine, 03/27/19 15:43:00 EDT, Nurse collect, Print label Y/N   PABS Ordered Blood, Routine, RT - Routine, 03/27/19 15:43:00 EDT, Nurse collect, Print label Y/N   Red Blood Cells Ordered Blood, Stat, ST - Stat, Collected, 03/27/19 15:43:00 EDT DENSON SPANNER M, 1, Emergency, Print label Y/N   Type and Cross Ordered Blood, Routine, RT - Routine, 03/27/19 15:43:00 EDT, Nurse collect, Print label Y/N   UA w Mic Ordered Urine, Clean Catch, Stat, ST - Stat, 03/27/19 14:39:00 EDT, 03/27/19 14:39:00 EDT, Nurse collect   Irwin Carbon Completed Blood, RT, RT - Routine, Collected, 03/27/19 15:20:00 EDT MITCHPA, 03/27/19 15:20:00 EDT, Venous Draw, 03/27/19 15:21:00 EDT, SF CP Login, MITCHPA, Print label Y/N, [email or web address], Complete   XTube PST Completed Blood, RT, RT - Routine, Collected, 03/27/19 15:20:00 EDT MITCHPA, 03/27/19 15:20:00 EDT, Venous Draw, 03/27/19 15:20:00 EDT, SF CP Login, MITCHPA, Print label Y/N, [email or web address]_[email or web address]_[email or web address], Complete               Radiology Orders  Name Status Details   XR Chest 1 View Portable Completed 03/27/19 14:39:00 EDT, STAT 1 hour or less, Reason: Fever, Transport Mode: Portable,  pp_set_radiology_subspecialty               Patient Care Orders  Name Status Details   Bed Request Communication Ordered 03/27/19 17:04:00 EDT med/surg, FUO, Sickle Cell Anemia   COVID-19 Status Ordered 03/27/19 14:39:37 EDT, NOT VALID FOR pharmacy, laboratory, radiology., 03/27/19 14:39:37 EDT, COVID-19 Not Detected   Cardiac/NIBP/Pulse Ox Monitoring Completed 03/27/19 14:39:00 EDT, This message can only be seen by Nursing, it is not visible to Pharmacy, Laboratory, or Radiology., 03/27/19 14:39:00 EDT, 03/27/19 14:39:00 EDT, Once   Change attending to Ordered  03/27/19 17:04:00 EDT, DODIE KNEE T   Communication to Nursing Completed 03/27/19 15:43:00 EDT, Consent to chart, 03/27/19 15:43:00 EDT, 03/27/19 15:43:00 EDT   DC ISO Order/Icons Ordered 03/27/19 16:28:46 EDT, 03/27/19 16:28:46 EDT   ED Assessment Adult Ordered 03/27/19 14:03:22 EDT, 03/27/19 14:03:22 EDT   ED Only Oxygen Therapy Completed 03/27/19 14:39:00 EDT, STAT 1 hour or less, 03/27/19 14:39:00 EDT, Keep SAT > 92%   ED Secondary Triage Ordered 03/27/19 14:03:22 EDT, 03/27/19 14:03:22 EDT   ED Triage Adult Completed 03/27/19 13:53:50 EDT, 03/27/19 13:53:50 EDT   Intake and Output Completed 03/27/19 14:39:00 EDT, Once, 03/27/19 14:39:00 EDT, Strict   Notify Provider Completed 03/27/19 14:39:37 EDT, This message can only be seen by Nursing, it is not visible to Pharmacy, Laboratory, or Radiology., 03/27/19 14:39:37 EDT   Notify Provider Completed 03/27/19 15:43:00 EDT, Elevated temperature before transfusion, 03/27/19 15:43:00 EDT, 03/27/19 15:43:00 EDT, Once   Notify Provider Completed 03/27/19 15:43:00 EDT, Temperature increase greater than 1 degree C during transfusion, 03/27/19 15:43:00 EDT, 03/27/19 15:43:00 EDT, Once   Order to Transfuse Ordered 03/27/19 15:43:00 EDT, HgB <7, 03/27/19 15:43:00 EDT, PRBC, Rapid (as tolerated, up to 240 mL/hr)   Patient Isolation Ordered 03/27/19 14:39:00 EDT, Contact and Airborne, Constant Indicator   Sepsis  Quality Measures Ordered 03/27/19 14:05:13 EDT, 03/27/19 14:05:13 EDT   VTE Quality Measures Ordered 03/27/19 17:05:31 EDT, 03/27/19 17:05:31 EDT       ---------------------------------------------------------------------------------------------------------------------  [facility] allows you to manage your health, view your test results, and retrieve your discharge documents from your hospital stay securely and conveniently from your computer.     To begin the enrollment process, visit [email or web address] Click on "Sign up now" under [facility].   Comment:

## 2019-03-27 NOTE — Nursing Note (Signed)
Adult Patient History Form-Text       Adult Patient History Entered On:  03/27/2019 20:02 EDT    Performed On:  03/27/2019 19:58 EDT by Justin Mend, RN, Wilburt Finlay               General Info   Patient Identified :   Verbal   Information Given By :   Self   Preferred Mode of Communication :   Verbal   Accompanied By :   None   In Charge of News (ICON) Name :   Deloria Lair   Pregnancy Status :   Patient denies   Has the patient received chemotherapy or immunotherapy (cytotoxic)  in the last 48-72 hours? :   No   In Clinical Trial With Signed Consent for Related Condition :   No signed consent for clinical trial   Is the patient currently (2-3 days) receiving radiation treatment? :   No   CONNELLY, RN, GAIL E - 03/27/2019 19:58 EDT   Allergies   (As Of: 03/27/2019 20:02:41 EDT)   Allergies (Active)   contrast media (iodine-based)  Estimated Onset Date:   Unspecified ; Created ByRock Nephew, RN, JENNETTE; Reaction Status:   Active ; Category:   Drug ; Substance:   contrast media (iodine-based) ; Type:   Allergy ; Severity:   Unknown ; Updated By:   Irene Limbo; Reviewed Date:   03/27/2019 19:59 EDT      fentaNYL  Estimated Onset Date:   Unspecified ; Created By:   Rock Nephew RN, JENNETTE; Reaction Status:   Active ; Category:   Drug ; Substance:   fentaNYL ; Type:   Allergy ; Severity:   Unknown ; Updated By:   Irene Limbo; Reviewed Date:   03/27/2019 19:59 EDT      morphine  Estimated Onset Date:   Unspecified ; Created ByRock Nephew, RN, JENNETTE; Reaction Status:   Active ; Category:   Drug ; Substance:   morphine ; Type:   Allergy ; Severity:   Unknown ; Updated By:   Irene Limbo; Reviewed Date:   03/27/2019 19:59 EDT      Rocephin  Estimated Onset Date:   Unspecified ; Created By:   Rock Nephew RN, JENNETTE; Reaction Status:   Active ; Category:   Drug ; Substance:   Rocephin ; Type:   Allergy ; Severity:   Unknown ; Updated By:   Irene Limbo; Reviewed Date:   03/27/2019 19:59  EDT        Problem History   (As Of: 03/27/2019 20:02:41 EDT)   Problems(Active)    Hypertension (IMO  :91478 )  Name of Problem:   Hypertension ; Recorder:   Rock Nephew, RN, JENNETTE; Confirmation:   Confirmed ; Classification:   Medical ; Code:   8157285125 ; Contributor System:   Dietitian ; Last Updated:   11/08/2015 5:36 EST ; Life Cycle Date:   11/08/2015 ; Life Cycle Status:   Active ; Vocabulary:   IMO        Sickle cell (SNOMED CT  :13086578 )  Name of Problem:   Sickle cell ; Recorder:   ALBAYALDE, RN, JENNETTE; Confirmation:   Confirmed ; Classification:   Medical ; Code:   [id number] ; Contributor System:   Dietitian ; Last Updated:   11/08/2015 5:36 EST ; Life Cycle Date:   11/08/2015 ; Life Cycle Status:   Active ; Vocabulary:   SNOMED CT  Diagnoses(Active)    Fever  Date:   03/27/2019 ; Diagnosis Type:   Discharge ; Confirmation:   Confirmed ; Clinical Dx:   Fever ; Classification:   Medical ; Clinical Service:   Non-Specified ; Code:   ICD-10-CM ; Probability:   0 ; Diagnosis Code:   R50.9      Generalized body aches  Date:   03/27/2019 ; Diagnosis Type:   Reason For Visit ; Confirmation:   Complaint of ; Clinical Dx:   Generalized body aches ; Classification:   Medical ; Clinical Service:   Emergency medicine ; Code:   PNED ; Probability:   0 ; Diagnosis Code:   [id number]      Sickle cell pain crisis  Date:   03/27/2019 ; Diagnosis Type:   Discharge ; Confirmation:   Confirmed ; Clinical Dx:   Sickle cell pain crisis ; Classification:   Medical ; Clinical Service:   Non-Specified ; Code:   ICD-10-CM ; Probability:   0 ; Diagnosis Code:   D57.00        Procedure History        -    Procedure History   (As Of: 03/27/2019 20:02:41 EDT)     Anesthesia Minutes:   0 ; Procedure Name:   Port ; Procedure Minutes:   0 ; Last Reviewed Dt/Tm:   03/27/2019 19:59:28 EDT            Anesthesia Minutes:   0 ; Procedure Name:   thoracotomy ; Procedure Minutes:   0 ; Last Reviewed Dt/Tm:   03/27/2019  19:59:28 EDT            Anesthesia Minutes:   0 ; Procedure Name:   hernia repair ; Procedure Minutes:   0 ; Last Reviewed Dt/Tm:   03/27/2019 19:59:28 EDT            Anesthesia Minutes:   0 ; Procedure Name:   splenectomy ; Procedure Minutes:   0 ; Last Reviewed Dt/Tm:   03/27/2019 19:59:28 EDT            Anesthesia Minutes:   0 ; Procedure Name:   Cholecystectomy; ; Procedure Minutes:   0 ; Last Reviewed Dt/Tm:   03/27/2019 19:59:28 EDT            Immunizations   Last Tetanus :   Jobe Gibbon, RN, Wilburt Finlay - 03/27/2019 19:58 EDT   ID Risk Screen Symptoms   Recent Travel History :   No recent travel   Close Contact with COVID-19  ID :   No   CONNELLY, RN, GAIL E - 03/27/2019 19:58 EDT   Have you been tested for COVID-19 ID :   Yes   CONNELLY, RN, Dondra Spry E - 03/27/2019 20:08 EDT     TB Symptom Screen :   No symptoms   C. diff Symptom/History ID :   Neither of the above   MRSA/VRE Screening :   None of these apply   CRE Screening :   Not applicable   CONNELLY, RN, Dondra Spry E - 03/27/2019 19:58 EDT   ID COVID-19 Testing   Last 14 days COVID-19 ID :   Yes   Where tested COVID-19 ID :   [facility]   Result of COVID-19 ID :   Not Detected   Justin Mend RN, Wilburt Finlay - 03/27/2019 20:08 EDT   Bloodless Medicine   Is Blood Transfusion Acceptable to Patient :   Yes  Justin Mend RN, Wilburt Finlay - 03/27/2019 19:58 EDT   Nutrition   MST Does Your Current Diet Include :   None   MST Have You Recently Lost Weight Without Trying? :   No   MST Weight Loss Score :   0    CONNELLY, RN, Wilburt Finlay - 03/27/2019 19:58 EDT   Functional   Sensory Deficits :   None   ADLs Prior to Admission :   Independent   Justin Mend RNWilburt Finlay - 03/27/2019 19:58 EDT   Social History   Social History   (As Of: 03/27/2019 20:04:08 EDT)   Tobacco:        Former smoker   (Last Updated: 11/08/2015 05:40:04 EST by Rock Nephew, RN, JENNETTE)          Alcohol:        Denies   (Last Updated: 03/27/2019 20:00:39 EDT by Justin Mend, RN, GAIL E)          Substance Use:        Opioid Complex, Current,  Prescription medications, Marijuana   (Last Updated: 03/27/2019 20:04:05 EDT by Justin Mend, RN, Wilburt Finlay)            Spiritual   Faith/Denomination :   Ephriam Knuckles   Do you have a concern that you would like to address with a Chaplain? :   No   Do you have any religious/spiritual/cultural beliefs that could impact the way your care is provided? :   No   CONNELLY, RN, Wilburt Finlay - 03/27/2019 19:58 EDT   Harm Screen   Injuries/Abuse/Neglect in Household :   Denies   Feels Unsafe at Home :   No   Last 3 mo, thoughts killing self/others :   Patient denies   Justin Mend RN, Wilburt Finlay - 03/27/2019 19:58 EDT   Advance Directive   Advance Directive :   No   Patient Wishes to Receive Further Information on Advance Directives :   No   Annita Brod 03/27/2019 19:58 EDT   Education   Written Language :   [place]   Primary Language :   Morton Stall, RN, Wilburt Finlay - 03/27/2019 19:58 EDT   Caregiver/Advocate Language   Patient :   Verbal explanation, Demonstration   Justin Mend RN, Wilburt Finlay - 03/27/2019 19:58 EDT   Barriers to Learning :   Acuity of Illness, Difficulty concentrating   Teaching Method :   Demonstration, Explanation   Responsible Learner Present for Session :   No   Justin Mend RNWilburt Finlay - 03/27/2019 19:58 EDT   Preventative Measures Information   Unit/Room Orientation :   Verbalizes understanding   Environmental Safety :   Verbalizes understanding   Hand Washing :   Verbalizes understanding   Infection Prevention :   Verbalizes understanding   Justin Mend RN, Wilburt Finlay - 03/27/2019 19:58 EDT   DC Needs   CM Living Situation :   Family support   Anticipated Discharge Needs :   None   Justin Mend, RN, Wilburt Finlay - 03/27/2019 19:58 EDT   Valuables and Belongings   Valuables and Belongings   At Bedside :   Jewelry, Cell phone, Clothes, 2 necklaces   Justin Mend, RN, Wilburt Finlay - 03/27/2019 19:58 EDT   Patient Search Completed :   NA   Justin Mend RN, Wilburt Finlay - 03/27/2019 19:58 EDT   Admission Complete   Admission Complete :   Yes   CONNELLY, RN, GAIL E -  03/27/2019 19:58  EDT

## 2019-03-27 NOTE — Discharge Summary (Signed)
ED Clinical Summary                     [facility]  [address]  [place], [place] [address]  [phone]          PERSON INFORMATION  Name: JOCILYN, MOSSHOLDER Age:  33 Years DOB: 08-Mar-1986   Sex: Female Language: English PCP: PCP,  NONE   Marital Status: Single Phone: [phone] Med Service: MED-Medicine   MRN: [id number] Acct# [id number] Arrival: 03/27/2019 17:04:00   Visit Reason: Generalized body aches; SICKLECELL Acuity: 2 LOS: 000 04:44   Address:    [address] [place] [address]   Diagnosis:    Fever; Sickle cell pain crisis  Medications:          Medications that have not changed  Other Medications  citalopram (CeleXA) Oral (given by mouth) every day.  Last Dose:____________________  clonazePAM (KlonoPIN) 0.5 Milligram Oral (given by mouth) 3 times a day as needed anxiety.  Last Dose:____________________  fentaNYL (fentaNYL 50 mcg/hr transdermal film, extended release) 1 Patches Topical (on the skin) every 72 hours.  Last Dose:____________________  folic acid 1 Milligram Oral (given by mouth) every day.  Last Dose:____________________  ibuprofen   Last Dose:____________________  mirtazapine (mirtazapine 30 mg oral tablet) 1 Tabs Oral (given by mouth) Once a Day (at bedtime).  Last Dose:____________________  oxyCODONE 30 Milligram Oral (given by mouth) every 8 hours as needed moderate pain (4-7).  Last Dose:____________________  promethazine (Phenergan)   Last Dose:____________________  promethazine (promethazine 25 mg oral tablet) 1 Tabs Oral (given by mouth) 3 times a day as needed nausea/vomiting. Refills: 0.  Last Dose:____________________  sodium bicarbonate (sodium bicarbonate oral) Oral (given by mouth) 4 times a day.  Last Dose:____________________      Medications Administered During Visit:                Medication Dose Route   Sodium Chloride 0.9% 1650 mL IV   acetaminophen 1000 mg Oral   hydromorphone 1 mg IV Push   hydromorphone 2 mg IV Push    promethazine 12.5 mg IV Push   diphenhydrAMINE 25 mg IV Push   Sodium Chloride 0.9% 500 mL IV Piggyback               Allergies      Rocephin      fentaNYL      morphine      contrast media (iodine-based)      Major Tests and Procedures:  The following procedures and tests were performed during your ED visit.  COMMON PROCEDURES%>  COMMON PROCEDURES COMMENTS%>                PROVIDER INFORMATION               Provider Role Assigned Gershon Cull [facility] ED Provider 03/27/2019 14:15:05    Evlyn Courier D ED Nurse 03/27/2019 14:57:22        Attending Physician:  Dot Been T      Admit Doc  BRITELL-MD,  ASHLEY T     Consulting Doc  BRITELL-MD,  Veatrice Bourbon INFORMATION  Vital Sign Triage Latest   Temp Oral ORAL_1%> ORAL%>   Temp Temporal TEMPORAL_1%> TEMPORAL%>   Temp Intravascular INTRAVASCULAR_1%> INTRAVASCULAR%>   Temp Axillary AXILLARY_1%> AXILLARY%>   Temp Rectal RECTAL_1%> RECTAL%>   02 Sat 98 % 98 %   Respiratory  Rate RATE_1%> RATE%>   Peripheral Pulse Rate PULSE RATE_1%> PULSE RATE%>   Apical Heart Rate HEART RATE_1%> HEART RATE%>   Blood Pressure BLOOD PRESSURE_1%>/ BLOOD PRESSURE_1%>74 mmHg BLOOD PRESSURE%> / BLOOD PRESSURE%>79 mmHg                 Immunizations      No Immunizations Documented This Visit          DISCHARGE INFORMATION   Discharge Disposition: S Outpt-Admitted As Inpatient   Dis[facility] Location:  [facility]   Discharge Date and Time:     ED Checkout Date and Time:  03/27/2019 18:37:02     DEPART REASON INCOMPLETE INFORMATION             Problems      Active           Hypertension          Sickle cell              Smoking Status      Former smoker         PATIENT EDUCATION INFORMATION  Instructions:          Follow up:            ED PROVIDER DOCUMENTATION

## 2019-03-27 NOTE — ED Notes (Signed)
ED Patient Education Note     Patient Education Materials Follows:

## 2019-03-28 LAB — CBC
Hematocrit: 26.7 % — ABNORMAL LOW (ref 34.0–47.0)
Hemoglobin: 9 g/dL — ABNORMAL LOW (ref 11.5–15.7)
MCH: 28.1 pg (ref 27.0–34.5)
MCHC: 33.7 g/dL (ref 32.0–36.0)
MCV: 83.4 fL (ref 81.0–99.0)
MPV: 9.5 fL (ref 7.2–13.2)
NRBC Absolute: 0.11 10*3/uL
NRBC Automated: 0.7 %
Platelets: 377 10*3/uL (ref 140–440)
RBC: 3.2 x10e6/mcL — ABNORMAL LOW (ref 3.60–5.20)
RDW: 18.1 % — ABNORMAL HIGH (ref 11.0–16.0)
WBC: 16.8 10*3/uL — ABNORMAL HIGH (ref 3.8–10.6)

## 2019-03-28 LAB — SICKLE CELL SCREEN
Lot/Kit Number: 93500265
Lot/Kit Number: 93500265
Sickle Cell Screen: NEGATIVE
Sickle Cell Screen: NEGATIVE

## 2019-03-28 LAB — URINALYSIS WITH REFLEX TO CULTURE
Bilirubin Urine: NEGATIVE
Bilirubin Urine: NEGATIVE
Glucose, UA: NEGATIVE mg/dL
Glucose, UA: NEGATIVE mg/dL
Ketones, Urine: NEGATIVE mg/dL
Ketones, Urine: NEGATIVE mg/dL
Nitrite, Urine: NEGATIVE
Nitrite, Urine: NEGATIVE
Protein, UA: 30 — AB
Specific Gravity, UA: 1.015 (ref 1.003–1.035)
Specific Gravity, UA: 1.015 (ref 1.003–1.035)
Urobilinogen, Urine: 0.2 EU/dL
Urobilinogen, Urine: 0.2 EU/dL
pH, UA: 6.5 (ref 4.5–8.0)
pH, UA: 7 (ref 4.5–8.0)

## 2019-03-28 LAB — RESPIRATORY PANEL, MOLECULAR
Adenovirus: NOT DETECTED
Bordetella Pertussis: NOT DETECTED
CORONAVIRUS 229E: NOT DETECTED
CORONAVIRUS HKU1: NOT DETECTED
CORONAVIRUS NL63: NOT DETECTED
CORONAVIRUS OC43: NOT DETECTED
Chlamydia Pneumoniae: NOT DETECTED
Human Metapneumovirus: NOT DETECTED
Human Rhinovirus/Enterovirus: NOT DETECTED
INFLUENZA A: NOT DETECTED
INFLUENZA B: NOT DETECTED
MYCOPLASMA PNEUMONIAE: NOT DETECTED
PARAINFLUENZA 4: NOT DETECTED
Parainfluenza 1: NOT DETECTED
Parainfluenza 2: NOT DETECTED
Parainfluenza 3: NOT DETECTED
Respiratory Syncytial Virus: NOT DETECTED

## 2019-03-28 LAB — PROCALCITONIN: Procalcitonin: 0.15 ng/mL (ref <=0.25)

## 2019-03-28 NOTE — Progress Notes (Signed)
OT Time Spent with Patient Acute/OP-Text       OT Time Spent With Patient Outpatient/Acute Entered On:  03/28/2019 13:53 EDT    Performed On:  03/28/2019 13:52 EDT by AMMONS, OT, STEPHEN               Time Spent With Patient   OT Individual Eval Time, Low Complexity :   0 minutes   OT Total Untimed Code Treatment Minutes :   0 minutes   OT Total Treatment Time :   0 minutes   AMMONS, OT, STEPHEN - 03/28/2019 13:52 EDT   OT Units Cancelled Missed     OT Units Lost #1          Amount :    1               Reason :    Other: Pt denies any OT needs.  Pt states she is able to complete ADLs without assistance.  Pt is up ad lib in room per RN.  Will sign off.                AMMONS, OT, STEPHEN - 03/28/2019 13:52 EDT

## 2019-03-28 NOTE — Progress Notes (Signed)
Chaplaincy Note - Text       Chaplaincy Note Entered On:  03/28/2019 14:18 EDT    Performed On:  03/28/2019 14:15 EDT by Hezzie Bump               Chaplaincy Consult   Faith/Denomination :   Ephriam Knuckles   Importance of Faith :   Not assessed   Religious Spiritual Resources :   Acceptance of illness, Hope   Religious Spiritual Concerns :   Anger, Other: The Pt's concern was about her treatment. She expressed that she wanted to be discharge and continue taking her meds at home rather than staying in the hospital.   Additional Information, Chaplaincy :   My visit with the Pt was receptive, but the Pt was discouraged and felt that she was not heard about what she wanted. I tried to encourage her and she even told me that there was no need for me to apologize because she wanted to go.    Hezzie Bump - 03/28/2019 14:15 EDT

## 2019-03-28 NOTE — Progress Notes (Signed)
Pharmacy Clinical Interventions - Text       Pharmacy Clinical Interventions Entered On:  03/28/2019 9:06 EDT    Performed On:  03/28/2019 9:05 EDT by Delford Field, CPhT, PATRICE L               Interventions   Intervention Type Pharmacy :   Medication Reconciliation/Transition of Care   Clinical Importance Pharmacy :   Potentially major   Medication Safety Reporting Pharmacy :   Non Medication Event   Pharmacist Intervention Time :   > 30 Minutes   WRIGHT, CPhT, PATRICE L - 03/28/2019 9:05 EDT   Provider Notification   Provider Notification Reason :   Medication   *Medication Details :   Other: med rec   Physician Requested Interventions :   Other: med rec   WRIGHT, CPhT, PATRICE L - 03/28/2019 9:05 EDT

## 2019-03-28 NOTE — Case Communication (Signed)
CM D/C Planning Assessment Ongoing- Text       CM Admission Assessment Entered On:  03/28/2019 14:12 EDT    Performed On:  03/28/2019 14:05 EDT by Michaelene Song               CM Admission Assessment   CM Reason for Care Management Referral :   Discharge planning assessment   CM Insurance Information :   Managed Medicare, Medicaid   CM Insurance Co. Name :   [facility] [employer]   CM Name of Patient Pharmacy :   [facility] ( [address]   CM Date and Time Inpatient Order :   03/27/2019 17:05 EDT   CM Date and Time Admission IM :   03/28/2019 11:55 EDT   CM Date of 3rd MN Occurrence :   03/29/2019 EDT   CM Patient has PCP :   Yes   CM Name of PCP :   Royston Sinner [facility])   Discharge Transporation Needs :   No   CM Assessment Discussion With :   Patient   CM Home/Lay Caregiver Name/Relationship :   [facility] (Father)   CM Home/Lay Caregiver Contact Number :   [phone]   CM Living Situation :   Family support   CM Patient Admitted From :   Home w/Dtr   CM Initial Tentative Discharge Plan :   Home w/Dtr   CM Alternate Discharge Plan :   [facility]   Anticipated Hosp Related Barriers to DC :   None identified   CM Home Barriers :   None   CM Progress Note :   03/28/19: Pt w/sickle cell disease. Pt w/hx of depression, anxiety and sickle cell disease, presenting w/fever, body aches and n/v. Per RN, pt recvd 2 units of blood, getting IVF's. PT/OT consulted. Pt getting IV dialudid for pain. Cm spoke w/pt via phone due to COVID prevention protocols. Pt lives at home w/her dtr, Serita Kyle and was I of ADL's, prior to admit. Pt states that she is followed by PCP, Royston Sinner [facility]) and [facility] w/no issues reported. Pt is COVID Negative. Medicare IM discussed with pt and copy printed to the floor for pt to review. CM will cont to monitor and assist w/dc needs (FWS)     CM Admission Assessment Complete :   Yes   Andria Meuse,  FELICIA - 03/28/2019 14:05 EDT

## 2019-03-28 NOTE — Progress Notes (Signed)
 Inpatient PT Examination - Text       Inpatient PT Examination Entered On:  03/28/2019 14:01 EDT    Performed On:  03/28/2019 13:48 EDT by WHITT, PT, NANNETTE T               Reason for Treatment   Subjective Statement :   Patient demonstrating irritability during interview today.  States that she is not having any problem with walking.  States that when she is not in crisis that she can walk a long way...  States that she lives in a 34 story dwelling - but up a flight of stairs.  Does not drive - I call [employer]...  States that she gets rides to grocery store to get groceries.  States that she lives with her 34 year old dtr.  States that she has family in the area but does not indicate that they are necessarily the ones to assist her with rides to the grocery store.  States that she does not have any othopedic equipment - PTA amb I without assistive device.      *Reason for Referral :   Weakness     *Chief Complaint :   All over body pain - no particular area     WHITT, PT, NANNETTE T - 03/28/2019 13:48 EDT   General Info   Physical Therapy Orders :   PT Evaluation and Treatment Acute - 03/27/19 19:41:00 EDT, Stop date 03/27/19 19:41:00 EDT, A consult cannot be completed if there is a bedrest activity order; check for bedrest order.     Precautions RTF :    Home Med Hx Update Notification, 03/28/19 9:33:00 EDT, 03/28/19 9:33:00 EDT, 03/28/19 9:33:00 EDT, Ordered   Hospitalist Team Color, 03/28/19 8:12:00 EDT, Purple, Constant Indicator, Ordered   Communication to Nursing, 03/27/19 19:41:00 EDT, RD may manage/modify diet order and/or enteral nutrition per approved MNT protocol, 03/27/19 19:41:00 EDT, 03/27/19 19:41:00 EDT, Ordered   Hospitalist Team Color, 03/27/19 19:41:00 EDT, [place], Constant Indicator, Discontinued   Notify Rapid Response Team, 03/27/19 19:41:00 EDT, For concerns regarding patient condition & notify MD, 03/27/19 19:41:00 EDT, 03/27/19 19:41:00 EDT, Ordered   Change attending to, 03/27/19  17:04:00 EDT, BRITELL-MD,  ASHLEY T, Ordered   COVID-19 Status, 03/27/19 14:39:37 EDT, NOT VALID FOR pharmacy, laboratory, radiology., 03/27/19 14:39:37 EDT, COVID-19 Not Detected, Ordered     Orientation Assessment :   Oriented x 4   Affect/Behavior :   Irritable, Other: Minimally cooperative   Basic Command Following :   Other: Able to understand requests for movement, but tends to do things her own way.   Safety/Judgment :   Other: Jumped out of bed suddenly - had IV on other side - asked her to wait for PT to get IV on other side of bed, but patient managed to walk around the room without this occurring and without pulling IV out.   Pain Present :   Yes actual or suspected pain   WHITT, PT, NANNETTE T - 03/28/2019 13:48 EDT   Pain Assessment   Pain Present :   Yes actual or suspected pain   Pain Present :   Other: Describes all over body pain - no place in particular.   Time Pattern :   Constant   Self Report Pain :   Numeric rating scale   Numeric Pain Scale :   6   Acceptable Numeric Pain Scale :   3   Numeric Pain Acceptable Intensity Score :  3    Numeric Pain Score :   6    WHITT, PT, NANNETTE T - 03/28/2019 13:48 EDT   Home Environment   Living Environment :   Home Environment  Sensory Deficits:  None  Performed By:  VENUS OBIE ALISA FORBES 03/27/2019     Living Situation :   Home with family support   Lives With :   Other: 34 y o dtr   WHITT, PT, NANNETTE T - 03/28/2019 13:48 EDT   Stairs     Outside Stairs          Number of Stairs :    10               Rail :    Bilateral                WHITT, PT, NANNETTE T - 03/28/2019 13:48 EDT         LE Range/Strength   LE Overall Range of Motion Grid   Left Lower Extremity Active Range :   Within functional limits   Right Lower Extremity Active Range :   Within functional limits   WHITT, PT, NANNETTE T - 03/28/2019 13:48 EDT   Lt Lower Extremity Strength :   Within functional limits   Rt Lower Extremity Strength :   Within functional limits   WHITT, PT, NANNETTE T -  03/28/2019 13:48 EDT   UE Strength/ROM   Upper Extremity Overall ROM Grid   Left Upper Extremity Active Range :   Within functional limits   Right Upper Extremity Active Range :   Within functional limits   WHITT, PT, NANNETTE T - 03/28/2019 13:48 EDT   Lt Upper Extremity Strength :   Within functional limits   Rt Upper Extremity Strength :   Within functional limits   WHITT, PT, NANNETTE T - 03/28/2019 13:48 EDT   Mobility   Mobility Grid   Roll Left :   Rehab Complete independence   Roll Right :   Rehab Complete independence   Supine to Sit :   Rehab Complete independence   Sit to Supine :   Rehab Complete independence   Scooting :   Rehab Complete independence   Edge of Bed Assist :   Rehab Complete independence   Transfer Sit to Stand :   Rehab Complete independence   Transfer Stand to Sit :   Rehab Complete independence   WHITT, PT, NANNETTE T - 03/28/2019 13:48 EDT   Ambulation Level Acute :   Supervision or setup   Ambulation Quality :   No LOB or gait deviation noted   Ambulation Distance :   20 ft   Device :   None   WHITT, PT, NANNETTE T - 03/28/2019 13:48 EDT   Assessment   PT Impairments or Limitations :   Endurance deficits, Strength deficits   Discharge Recommendations :   To home     D/CTransportation Recommendations :   No stretcher   D/C Transportation Recommendations Reviewed :   Yes   PT Treatment Recommendations :   Patient is moving well - but did not want to walk further than 20' - will attempt to work on strength and endurance with this patient, as she will allow.       WHITT, PT, NANNETTE T - 03/28/2019 13:48 EDT   DME   Additional Comments DME PT :   No needs identified - states that she has no equipment.   WHITT,  PTMERRILL [name] - 03/28/2019 13:48 EDT   Education   Responsible Learner Present for Session :   Yes   Home Caregiver Name/Relationship :   Patient   Barriers to Learning :   None evident   Teaching Method :   Demonstration, Explanation   WHITT, PT, NANNETTE T - 03/28/2019 13:48 EDT    Physical Therapy Education Grid   Exercise Program :   [place] understanding   (Comment: antiemb ex [WHITT, PT, NANNETTE T - 03/28/2019 13:48 EDT] )   Home Safety :   Verbalizes understanding   (Comment: call nursing for oob [WHITT, PT, NANNETTE T - 03/28/2019 13:48 EDT] )   Physical Therapy Plan of Care :   Verbalizes understanding   (Comment: intro to PT and progression [WHITT, PT, NANNETTE T - 03/28/2019 13:48 EDT] )   WHITT, PT, NANNETTE T - 03/28/2019 13:48 EDT   Long Term Goals   PT LT Goals Reviewed :   Yes   PT Patient,Caregiver Goal :   Would like for pain to decrease   WHITT, PT, NANNETTE T - 03/28/2019 13:48 EDT   Outpatient PT Long Term Goals Rehab     Long Term Goal 1  Long Term Goal 2        Goal :    Amb 300' with CGA/SBA   Up and down flight of stairs with CGA           Time Frame :    5 days              Status :    Initial   Initial             WHITT, PT, NANNETTE T - 03/28/2019 13:48 EDT  WHITT, PT, NANNETTE T - 03/28/2019 13:48 EDT        Short Term Goals   PT ST Goals Reviewed :   Yes   WHITT, PT, NANNETTE T - 03/28/2019 13:48 EDT   Other PT Goals Grid     Goal #1  Goal #2        Goal :    Amb 120' with CGA - no LOB   Patient I, compliant with HEP/antiemb ex           Time Frame :    3 days              Status :    Initial   Initial             WHITT, PT, NANNETTE T - 03/28/2019 13:48 EDT  WHITT, PT, NANNETTE T - 03/28/2019 13:48 EDT        Plan   Evaluation Date :   03/28/2019 13:59 EDT   PT Frequency Acute :   Daily   Duration :   30    PT Duration Unit Rehab :   Days   Treatments Planned :   Functional training, Gait training, Self Care Management, Stair training, Therapeutic activities, Therapeutic exercises   Treatment Plan/Goals Established With Patient/Caregiver :   Yes   Evaluation Complete :   Yes   WHITT, PT, NANNETTE T - 03/28/2019 13:48 EDT   Time Spent With Patient   PT Individual Eval Time, Moderate Complexity :   15 minutes   PT Evaluation Units, Moderate Complexity :   1 Unit   PT  Treatment Time Comment :   Supine to sit to stand I, impulsively - pt on opposite side of bed as IV -  asked her to allow PT to rectify this - she insisted on pulling on IV and walking around bed - IV did stay intact.  Amb approx 20' without assistive device - moving easily - no gait deviation/LOB noted.      PT Functional Training Units :   1 units   PT Functional Training Time :   10 minutes   PT Total Timed Code Treatment Units :   1 units   PT Total Timed Code Min :   10    PT Total Untimed Min :   15    PT Total Treatment Time Acute/OP :   25    WHITT, PT, NANNETTE T - 03/28/2019 13:48 EDT

## 2019-03-29 LAB — BASIC METABOLIC PANEL
Anion Gap: 8 mmol/L (ref 2–17)
Anion Gap: 8 mmol/L (ref 2–17)
BUN: 12 mg/dL (ref 6–20)
BUN: 10 mg/dL (ref 6–20)
CO2: 18 mmol/L — ABNORMAL LOW (ref 22–29)
CO2: 22 mmol/L (ref 22–29)
Calcium: 8.2 mg/dL — ABNORMAL LOW (ref 8.6–10.0)
Calcium: 8.4 mg/dL — ABNORMAL LOW (ref 8.6–10.0)
Chloride: 116 mmol/L — ABNORMAL HIGH (ref 98–107)
Chloride: 112 mmol/L — ABNORMAL HIGH (ref 98–107)
Creatinine: 0.5 mg/dL (ref 0.5–0.9)
Creatinine: 0.4 mg/dL — ABNORMAL LOW (ref 0.5–0.9)
GFR African American: 147 mL/min/{1.73_m2} (ref >=90)
GFR African American: 159 mL/min/{1.73_m2} (ref >=90)
GFR Non-African American: 127 mL/min/{1.73_m2} (ref >=90)
GFR Non-African American: 137 mL/min/{1.73_m2} (ref >=90)
Glucose: 103 mg/dL — ABNORMAL HIGH (ref 70–99)
Glucose: 88 mg/dL (ref 70–99)
OSMOLALITY CALCULATED: 283 mOsm/kg (ref 270–287)
OSMOLALITY CALCULATED: 282 mOsm/kg (ref 270–287)
Potassium: 4 mmol/L (ref 3.5–5.3)
Potassium: 3.8 mmol/L (ref 3.5–5.3)
Sodium: 142 mmol/L (ref 135–145)
Sodium: 142 mmol/L (ref 135–145)

## 2019-03-29 LAB — CBC
Hematocrit: 26.1 % — ABNORMAL LOW (ref 34.0–47.0)
Hemoglobin: 8.6 g/dL — ABNORMAL LOW (ref 11.5–15.7)
MCH: 27.7 pg (ref 27.0–34.5)
MCHC: 33 g/dL (ref 32.0–36.0)
MCV: 83.9 fL (ref 81.0–99.0)
MPV: 9.4 fL (ref 7.2–13.2)
NRBC Absolute: 0.13 10*3/uL
NRBC Automated: 1 %
Platelets: 350 10*3/uL (ref 140–440)
RBC: 3.11 x10e6/mcL — ABNORMAL LOW (ref 3.60–5.20)
RDW: 18.6 % — ABNORMAL HIGH (ref 11.0–16.0)
WBC: 12.5 10*3/uL — ABNORMAL HIGH (ref 3.8–10.6)

## 2019-03-29 NOTE — Discharge Summary (Signed)
 Inpatient Clinical Summary             [facility]  Post-Acute Care Transfer Instructions  PERSON INFORMATION   Name: Judith [name], Judith [name]  MRN: [id number]    FIN#: [phone]   PHYSICIANS  Admitting Physician: DODIE KNEE T  Attending Physician: DODIE KNEE DASEN   PCP: PCP,  NONE  Discharge Diagnosis:  Fever; Sickle cell pain crisis  Comment:       PATIENT EDUCATION INFORMATION  Instructions:               Medication Leaflets:               Follow-up:                                   MEDICATION LIST  Medication Reconciliation at Discharge:          Medications That Were Updated - Follow Current Instructions  Other Medications  Current citalopram (CeleXA 40 mg oral tablet) 1 Tabs Oral (given by mouth) every day., SOUND ALIKE / LOOK ALIKE - VERIFY DRUG  Last Dose:____________________    Current clonazePAM (KlonoPIN 0.5 mg oral tablet) 1 Tabs Oral (given by mouth) Once a Day (at bedtime)., SOUND ALIKE / LOOK ALIKE - VERIFY DRUG   SOUND ALIKE / LOOK ALIKE - VERIFY DRUG  Last Dose:____________________    Current fentaNYL (fentaNYL 50 mcg/hr transdermal film, extended release) 1 Patches Transdermal (apply on the skin) every 72 hours.  Last Dose:____________________    Current folic acid (folic acid 1 mg oral tablet) 1 Tabs Oral (given by mouth) every day.  Last Dose:____________________    Current ibuprofen (ibuprofen 800 mg oral tablet) 1 Tabs Oral (given by mouth) 3 times a day as needed for pain.  Last Dose:____________________    Current mirtazapine (Remeron 15 mg oral tablet) 1 Tabs Oral (given by mouth) Once a Day (at bedtime).  Last Dose:____________________    Current oxyCODONE (oxyCODONE 30 mg oral tablet) 1 Tabs Oral (given by mouth) every 4 hours as needed for pain.  Last Dose:____________________    Current promethazine (promethazine 25 mg oral tablet) 1 Tabs Oral (given by mouth) every 6 hours as needed as needed for nausea/vomiting.  Last Dose:____________________    Current sodium  bicarbonate (sodium bicarbonate 650 mg oral tablet) 1 Tabs Oral (given by mouth) 3 times a day.  Last Dose:____________________    Medications that have not changed  Other Medications  albuterol (Proventil HFA 90 mcg/inh inhalation aerosol) 2 Puffs Inhale (breathe in) every 4 hours as needed.  Last Dose:____________________  bisacodyl (Dulcolax Laxative 5 mg oral delayed release tablet) 1 Tabs Oral (given by mouth) every day as needed., DO NOT CRUSH  Last Dose:____________________  diphenhydrAMINE (Benadryl 25 mg oral tablet) 2 Tabs Oral (given by mouth) as needed prn.  Last Dose:____________________  docusate (Colace 100 mg oral capsule) 1 Capsules Oral (given by mouth) 2 times a day.  Last Dose:____________________  traZODone (traZODone 150 mg oral tablet) 1 Tabs Oral (given by mouth) Once a Day (at bedtime)., DO NOT CRUSH  Last Dose:____________________         Patient's Final Home Medication List Upon Discharge:           albuterol (Proventil HFA 90 mcg/inh inhalation aerosol) 2 Puffs Inhale (breathe in) every 4 hours as needed.  bisacodyl (Dulcolax Laxative 5 mg oral delayed release tablet) 1 Tabs  Oral (given by mouth) every day as needed., DO NOT CRUSH  citalopram (CeleXA 40 mg oral tablet) 1 Tabs Oral (given by mouth) every day., SOUND ALIKE / LOOK ALIKE - VERIFY DRUG  clonazePAM (KlonoPIN 0.5 mg oral tablet) 1 Tabs Oral (given by mouth) Once a Day (at bedtime)., SOUND ALIKE / LOOK ALIKE - VERIFY DRUG   SOUND ALIKE / LOOK ALIKE - VERIFY DRUG  diphenhydrAMINE (Benadryl 25 mg oral tablet) 2 Tabs Oral (given by mouth) as needed prn.  docusate (Colace 100 mg oral capsule) 1 Capsules Oral (given by mouth) 2 times a day.  fentaNYL (fentaNYL 50 mcg/hr transdermal film, extended release) 1 Patches Transdermal (apply on the skin) every 72 hours.  folic acid (folic acid 1 mg oral tablet) 1 Tabs Oral (given by mouth) every day.  ibuprofen (ibuprofen 800 mg oral tablet) 1 Tabs Oral (given by mouth) 3 times a day as  needed for pain.  mirtazapine (Remeron 15 mg oral tablet) 1 Tabs Oral (given by mouth) Once a Day (at bedtime).  oxyCODONE (oxyCODONE 30 mg oral tablet) 1 Tabs Oral (given by mouth) every 4 hours as needed for pain.  promethazine (promethazine 25 mg oral tablet) 1 Tabs Oral (given by mouth) every 6 hours as needed as needed for nausea/vomiting.  sodium bicarbonate (sodium bicarbonate 650 mg oral tablet) 1 Tabs Oral (given by mouth) 3 times a day.  traZODone (traZODone 150 mg oral tablet) 1 Tabs Oral (given by mouth) Once a Day (at bedtime)., DO NOT CRUSH         Comment:       ORDERS          Order Name Order Details   Discharge Patient 03/29/19 8:00:00 EDT

## 2019-03-29 NOTE — Nursing Note (Signed)
Nursing Discharge Summary - Text       Nursing Discharge Summary Entered On:  03/29/2019 10:49 EDT    Performed On:  03/29/2019 10:48 EDT by Gertie Exon, RN, Marcelline Mates               DC Information   Discharge To :   Home independently, Other: admit room 306   Mode of Discharge :   Wheelchair   Transportation :   Private vehicle   Accompanied By :   None   Psychiatrist, RN, Marcelline Mates - 03/29/2019 10:48 EDT   Education   Responsible Learner(s) :   Living Situation: Home with family support        Performed by: Mervyn Gay T - 03/28/2019 13:48  Home Caregiver Name/Relationship: Patient        Performed by: Mervyn Gay T - 03/28/2019 13:48     Barriers To Learning :   None evident   Teaching Method :   Tree surgeon, RN, Marcelline Mates - 03/29/2019 10:48 EDT   Post-Hospital Education Adult Grid   Activity Expectations :   Trenton Gammon understanding   Diagnostic Results :   Verbalizes understanding   Disease Process :   Verbalizes understanding   Importance of Follow-Up Visits :   Verbalizes understanding   Invasive Line Care :   Verbalizes understanding   Pain Management :   Verbalizes understanding   Physical Limitations :   Verbalizes understanding   Plan of Care :   Verbalizes understanding   Postoperative Instructions :   Verbalizes understanding   When to Call Health Care Provider :   [facility] understanding   Osterritter, RN, Marcelline Mates - 03/29/2019 10:48 EDT   Health Maintenance Education Adult Grid   Allergies :   Verbalizes understanding   Bathing/Hygiene :   TEFL teacher understanding   Diet/Nutrition :   TEFL teacher understanding   Exercise :   TEFL teacher understanding   Immunizations :   TEFL teacher understanding   Oral Care :   Theatre stage manager, Charity fundraiser, Marcelline Mates - 03/29/2019 10:48 EDT   Medication Education Adult Grid   Drug to Drug Interactions :   Verbalizes understanding   Med Dosage, Route, Scheduling :   TEFL teacher  understanding   Med Generic/Brand Name, Purpose, Action :   Verbalizes understanding   Medication Precautions :   IT sales professional, Medication :   Theatre stage manager, RN, Marcelline Mates - 03/29/2019 10:48 EDT   Safety Education Adult Scientist, research (medical), Fire :   Theatre stage manager, RN, Marcelline Mates - 03/29/2019 10:48 EDT   Time Spent Educating Patient :   10 minutes   Osterritter, RN, Marcelline Mates - 03/29/2019 10:48 EDT

## 2019-03-29 NOTE — Letter (Signed)
Provider Letter     You have been sent a Continuity of Care Document from [facility]    This email and any files transmitted with it are confidential and intended solely for the use of the individual or entity to which they are addressed. If you are not the named addressee, please delete this email from your system and do not disseminate, distribute, or copy this information. If you are not the intended recipient, you are notified that any disclosure of this email and its contents are strictly prohibited.    Note: This is not a monitored email address. If you have received this email in error, please do not reply to this email but contact Erwin Fraser at [phone].

## 2019-03-29 NOTE — Discharge Summary (Signed)
Discharge Summary    St. Annitta Jersey, MD  Admission Date: 03/27/2019  Discharged Date: 03/29/2019    CODE STATUS:  Full.    PRINCIPAL DIAGNOSIS:  Sickle cell disease with vasoocclusive crisis.    SECONDARY DIAGNOSES:  Depression, acidosis.    DISPOSITION:  Home.    MEDICATIONS ON DISCHARGE:  The patient will continue her home  medications which include fentanyl patch, oxycodone 30 mg q.4 hours  p.r.n.  No prescriptions be provided at time of discharge.    SIGNIFICANT STUDIES DURING HOSPITAL STAY:  Chest x-ray on admission  showed no infiltrate.    HOSPITAL COURSE:  This is a 33 year old female with a history of  sickle cell disease, depression, anxiety, presents to the Emergency  Room with body aches, nausea and vomiting that started few days prior  to admission.  The patient denied fever, cough, dysuria, diarrhea.   Physical exam on admission was remarkable for clear lungs.  No joint  swelling, no fever.  The hemoglobin was 5.9.  The patient was  tachycardic at 115, temperature was 37.9.  The patient was admitted  with diagnosis of fever.  Blood cultures were obtained as well as a  urinalysis.  IV fluids were started, but no antibiotics.  Dilaudid IV  was started q.2 hours 3 mg and 1 unit of packed red blood cells were  transfused.    Further hospital course was uneventful.  The patient became afebrile.   The [facility] records were reviewed the next day.  She was documented in  those records that the patient tended to use the medications to  frequently and then presented to the hospital when she ran out of  medication.  The plan of care as outlined by [facility] was to strictly  manage the patient with oral medications and avoid PCA and IV pushes  unless used for a brief period of approximately 24 hours.  At time of  evaluation, it was noted that the patient is requesting Dilaudid every  3 hours while tolerating oral intake.  In view of this she was  transitioned to her fentanyl patch and oral oxycodone and only a  small  amount of Dilaudid was made available on a p.r.n. basis.  The  hemoglobin level increased to 9 after 1 unit of packed red blood cell  transfusion.    By the next day, the patient's condition stabilized.  The hemoglobin  remained stable at 8.6 with WBC count of 12.5.  The patient was  afebrile with clear lungs, soft abdomen.  No joint pain.  She  tolerated oral intake well.  In view of this, she was discharged home  with recommendation to follow up with her primary care physician at  [facility]      Lurena Nida, MD  TRShona Simpson DD: 04/19/2019 18:10 TD: 04/19/2019 19:36 Job#: [id number]  \\[id number]\\DOC#: [id number]  \\[id number]\\  Signature Line    Electronically Signed on 04/20/2019 06:25 PM EDT  ________________________________________________  Beulah Gandy

## 2019-03-29 NOTE — Case Communication (Signed)
CM Discharge Planning Assessment - Text       CM Discharge Plan Entered On:  03/29/2019 11:50 EDT    Performed On:  03/29/2019 11:49 EDT by Jolly Mango               CM Discharge Plan   Discharge To :   Home independently, Other: admit room 306   CM Medicare Patient :   Yes   CM 3 MN Stay Validated :   No   CM StCM Statusatus :   IP   CM Date of 3rd MN Occurrence :   03/29/2019 0:00 EDT   CM Progress Note :   03/28/19: Pt w/sickle cell disease. Pt w/hx of depression, anxiety and sickle cell disease, presenting w/fever, body aches and n/v. Per RN, pt recvd 2 units of blood, getting IVF's. PT/OT consulted. Pt getting IV dialudid for pain. Cm spoke w/pt via phone due to COVID prevention protocols. Pt lives at home w/her dtr, Serita Kyle and was I of ADL's, prior to admit. Pt states that she is followed by PCP, Royston Sinner [facility]) and Rx meds are obtained from [facility] w/no issues reported. Pt is COVID Negative. Medicare IM discussed with pt and copy printed to the floor for pt to review. CM will cont to monitor and assist w/dc needs (FWS)    03/29/19: Pt is medically ready for d/c home with family support and has orders. Does not present with CM needs. Medicare IM signed 5/22. CM available if needs arise prior to d/c. - G. Giancristoforo, LMSW       Discharge Planning Assessment Complete :   Yes   Jolly Mango - 03/29/2019 11:49 EDT

## 2019-03-29 NOTE — Discharge Summary (Signed)
 Inpatient Patient Summary               [facility]  [address]  [place], [place] [address]  [phone]  Patient Discharge Instructions     Name: Judith [name], Judith [name]  Current Date: 03/29/2019 08:57:07  DOB: 06/26/1986 MRN: [id number] FIN: [id number]%>[id number]  Patient Address: [address] [place] [address]  Patient Phone: [phone]  Primary Care Provider:  Name: PCP,  NONE  Phone:    Immunizations Provided:       Discharge Diagnosis: Fever; Sickle cell pain crisis  Discharged To: TO, ANTICIPATED%>  Home Treatments: TREATMENTS, ANTICIPATED%>  Devices/Equipment: EQUIPMENT REHAB%>  Post Hospital Services: HOSPITAL SERVICES%>  Professional Skilled Services: SKILLED SERVICES%>  Therapist, sports and Community Resources:               SERV AND COMM RES, ANTICIPATED%>  Mode of Discharge Transportation: TRANSPORTATION%>  Discharge Orders          Discharge Patient 03/29/19 8:00:00 EDT         Comment:      Medications   During the course of your visit, your medication list was updated with the most current information. The details of those changes are reflected below:          Medications That Were Updated - Follow Current Instructions  Other Medications  Current citalopram (CeleXA 40 mg oral tablet) 1 Tabs Oral (given by mouth) every day., SOUND ALIKE / LOOK ALIKE - VERIFY DRUG  Last Dose:____________________    Current clonazePAM (KlonoPIN 0.5 mg oral tablet) 1 Tabs Oral (given by mouth) Once a Day (at bedtime)., SOUND ALIKE / LOOK ALIKE - VERIFY DRUG   SOUND ALIKE / LOOK ALIKE - VERIFY DRUG  Last Dose:____________________    Current fentaNYL (fentaNYL 50 mcg/hr transdermal film, extended release) 1 Patches Transdermal (apply on the skin) every 72 hours.  Last Dose:____________________    Current folic acid (folic acid 1 mg oral tablet) 1 Tabs Oral (given by mouth) every day.  Last Dose:____________________    Current ibuprofen (ibuprofen 800 mg oral tablet) 1 Tabs Oral (given by mouth) 3  times a day as needed for pain.  Last Dose:____________________    Current mirtazapine (Remeron 15 mg oral tablet) 1 Tabs Oral (given by mouth) Once a Day (at bedtime).  Last Dose:____________________    Current oxyCODONE (oxyCODONE 30 mg oral tablet) 1 Tabs Oral (given by mouth) every 4 hours as needed for pain.  Last Dose:____________________    Current promethazine (promethazine 25 mg oral tablet) 1 Tabs Oral (given by mouth) every 6 hours as needed as needed for nausea/vomiting.  Last Dose:____________________    Current sodium bicarbonate (sodium bicarbonate 650 mg oral tablet) 1 Tabs Oral (given by mouth) 3 times a day.  Last Dose:____________________    Medications that have not changed  Other Medications  albuterol (Proventil HFA 90 mcg/inh inhalation aerosol) 2 Puffs Inhale (breathe in) every 4 hours as needed.  Last Dose:____________________  bisacodyl (Dulcolax Laxative 5 mg oral delayed release tablet) 1 Tabs Oral (given by mouth) every day as needed., DO NOT CRUSH  Last Dose:____________________  diphenhydrAMINE (Benadryl 25 mg oral tablet) 2 Tabs Oral (given by mouth) as needed prn.  Last Dose:____________________  docusate (Colace 100 mg oral capsule) 1 Capsules Oral (given by mouth) 2 times a day.  Last Dose:____________________  traZODone (traZODone 150 mg oral tablet) 1 Tabs Oral (given by mouth) Once a Day (at bedtime)., DO NOT CRUSH  Last Dose:____________________         [facility] would like to thank you for allowing us  to assist you with your healthcare needs. The following includes patient education materials and information regarding your injury/illness.     Judith [name] has been given the following list of follow-up instructions, prescriptions, and patient education materials:  Follow-up Instructions:             It is important to always keep an active list of medications available so that you can share with other providers and manage your medications appropriately. As an  additional courtesy, we are also providing you with your final active medications list that you can keep with you.            albuterol (Proventil HFA 90 mcg/inh inhalation aerosol) 2 Puffs Inhale (breathe in) every 4 hours as needed.  bisacodyl (Dulcolax Laxative 5 mg oral delayed release tablet) 1 Tabs Oral (given by mouth) every day as needed., DO NOT CRUSH  citalopram (CeleXA 40 mg oral tablet) 1 Tabs Oral (given by mouth) every day., SOUND ALIKE / LOOK ALIKE - VERIFY DRUG  clonazePAM (KlonoPIN 0.5 mg oral tablet) 1 Tabs Oral (given by mouth) Once a Day (at bedtime)., SOUND ALIKE / LOOK ALIKE - VERIFY DRUG   SOUND ALIKE / LOOK ALIKE - VERIFY DRUG  diphenhydrAMINE (Benadryl 25 mg oral tablet) 2 Tabs Oral (given by mouth) as needed prn.  docusate (Colace 100 mg oral capsule) 1 Capsules Oral (given by mouth) 2 times a day.  fentaNYL (fentaNYL 50 mcg/hr transdermal film, extended release) 1 Patches Transdermal (apply on the skin) every 72 hours.  folic acid (folic acid 1 mg oral tablet) 1 Tabs Oral (given by mouth) every day.  ibuprofen (ibuprofen 800 mg oral tablet) 1 Tabs Oral (given by mouth) 3 times a day as needed for pain.  mirtazapine (Remeron 15 mg oral tablet) 1 Tabs Oral (given by mouth) Once a Day (at bedtime).  oxyCODONE (oxyCODONE 30 mg oral tablet) 1 Tabs Oral (given by mouth) every 4 hours as needed for pain.  promethazine (promethazine 25 mg oral tablet) 1 Tabs Oral (given by mouth) every 6 hours as needed as needed for nausea/vomiting.  sodium bicarbonate (sodium bicarbonate 650 mg oral tablet) 1 Tabs Oral (given by mouth) 3 times a day.  traZODone (traZODone 150 mg oral tablet) 1 Tabs Oral (given by mouth) Once a Day (at bedtime)., DO NOT CRUSH      Take only the medications listed above. Contact your doctor prior to taking any medications not on this list.        Discharge instructions, if any, will display below     Instructions for Diet: INSTRUCTIONS FOR DIET%>   Instructions for Supplements:  SUPPLEMENT INSTRUCTIONS%>   Instructions for Activity: INSTRUCTIONS FOR ACTIVITY%>   Instructions for Wound Care: INSTRUCTIONS FOR WOUND CARE%>     Medication leaflets, if any, will display below     Patient education materials, if any, will display below           IS IT A STROKE? Act FAST and Check for these signs:    FACE                         Does the face look uneven?    ARM  Does one arm drift down?    SPEECH                    Does their speech sound strange?    TIME                         Call 9-1-1 at any sign of stroke  ---------------------------------------------------------------------------------------------------------------------------  Heart Attack Signs  Chest discomfort: Most heart attacks involve discomfort in the center of the chest and lasts more than a few minutes, or goes away and comes back. It can feel like uncomfortable pressure, squeezing, fullness or pain.  Discomfort in upper body: Symptoms can include pain or discomfort in one or both arms, back, neck, jaw or stomach.  Shortness of breath: With or without discomfort.  Other signs: Breaking out in a cold sweat, nausea, or lightheaded.  Remember, MINUTES DO MATTER. If you experience any of these heart attack warning signs, call 9-1-1 to get immediate medical attention!     ---------------------------------------------------------------------------------------------------------------------------  [facility] allows you to manage your health, view your test results, and retrieve your discharge documents from your hospital stay securely and conveniently from your computer.  To begin the enrollment process, visit [email or web address] Click on "Sign up now" under [facility].

## 2019-04-01 LAB — CULTURE, BLOOD 1

## 2019-04-17 LAB — CBC WITH MANUAL DIFFERENTIAL
Absolute Baso #: 0.2 10*3/uL (ref 0.0–0.2)
Absolute Eos #: 0.4 10*3/uL (ref 0.0–0.5)
Absolute Lymph #: 5.7 10*3/uL — ABNORMAL HIGH (ref 1.0–3.2)
Absolute Mono #: 0.7 10*3/uL (ref 0.3–1.0)
Basophils %: 1 % (ref 0–2)
Eosinophils %: 2 % (ref 0–7)
Hematocrit: 20.5 % — CL (ref 34.0–47.0)
Hemoglobin: 6.8 g/dL — CL (ref 11.5–15.7)
Lymphocytes: 32 % (ref 15–45)
MCH: 27.5 pg (ref 27.0–34.5)
MCHC: 33.2 g/dL (ref 32.0–36.0)
MCV: 83 fL (ref 81.0–99.0)
MPV: 9.8 fL (ref 7.2–13.2)
Monocytes: 4 % (ref 4–12)
NRBC Absolute: 0.02 10*3/uL
NRBC Automated: 0.1 %
Neutrophils %. Manual count: 61 % (ref 42–74)
Neutrophils Absolute: 10.8 10*3/uL — ABNORMAL HIGH (ref 1.6–7.3)
Platelet Estimate: ADEQUATE
Platelets: 587 10*3/uL — ABNORMAL HIGH (ref 140–440)
RBC Morphology: ABNORMAL — AB
RBC: 2.47 x10e6/mcL — ABNORMAL LOW (ref 3.60–5.20)
RDW: 18.7 % — ABNORMAL HIGH (ref 11.0–16.0)
WBC: 17.7 10*3/uL — ABNORMAL HIGH (ref 3.8–10.6)

## 2019-04-17 LAB — URINALYSIS WITH MICROSCOPIC
Bilirubin Urine: NEGATIVE
Blood, Urine: NEGATIVE
Glucose, UA: NEGATIVE mg/dL
Ketones, Urine: NEGATIVE mg/dL
Leukocyte Esterase, Urine: NEGATIVE
MUCUS, URINE: NONE SEEN /LPF
Nitrite, Urine: NEGATIVE
Specific Gravity, UA: 1.015 (ref 1.003–1.035)
Urobilinogen, Urine: 0.2 EU/dL
pH, UA: 6.5 (ref 4.5–8.0)

## 2019-04-17 LAB — COMPREHENSIVE METABOLIC PANEL
ALT: 9 U/L (ref 0–33)
AST: 15 U/L (ref 0–32)
Albumin/Globulin Ratio: 1.61 mmol/L (ref 1.00–2.00)
Albumin: 4.5 g/dL (ref 3.5–5.2)
Alk Phosphatase: 100 U/L (ref 35–117)
Anion Gap: 10 mmol/L (ref 2–17)
BUN: 11 mg/dL (ref 6–20)
CO2: 14 mmol/L — ABNORMAL LOW (ref 22–29)
Calcium: 8.9 mg/dL (ref 8.6–10.0)
Chloride: 112 mmol/L — ABNORMAL HIGH (ref 98–107)
Creatinine: 0.7 mg/dL (ref 0.5–0.9)
GFR African American: 131 mL/min/{1.73_m2} (ref >=90)
GFR Non-African American: 113 mL/min/{1.73_m2} (ref >=90)
Globulin: 2.8 g/dL (ref 1.9–4.4)
Glucose: 98 mg/dL (ref 70–99)
OSMOLALITY CALCULATED: 271 mOsm/kg (ref 270–287)
Potassium: 3.3 mmol/L — ABNORMAL LOW (ref 3.5–5.3)
Sodium: 136 mmol/L (ref 135–145)
Total Bilirubin: 0.7 mg/dL (ref 0.00–1.20)
Total Protein: 7.3 g/dL (ref 6.4–8.3)

## 2019-04-17 LAB — RETICULOCYTES
RBC: 2.47 x10e6/mcL — ABNORMAL LOW (ref 3.60–5.20)
Retic Ct Abs: 0.1314 /mcL — ABNORMAL HIGH (ref 0.0210–0.1080)
Retic Ct Pct: 5.5 % — ABNORMAL HIGH (ref 0.5–2.0)

## 2019-04-17 LAB — ADD ON LAB TEST

## 2019-04-17 LAB — COVID-19: SARS-CoV-2: NOT DETECTED

## 2019-04-17 LAB — POC PREGNANCY UR-QUAL: Preg Test, Ur: NEGATIVE

## 2019-04-17 LAB — LACTIC ACID: Lactic Acid: 1.2 mmol/L (ref 0.5–2.0)

## 2019-04-17 LAB — PROCALCITONIN: Procalcitonin: 0.27 ng/mL — ABNORMAL HIGH (ref <=0.24)

## 2019-04-17 NOTE — ED Notes (Signed)
ED Triage Note       ED Secondary Triage Entered On:  04/17/2019 15:20 EDT    Performed On:  04/17/2019 15:19 EDT by Teressa Senter, RN, Arsenio Katz               General Information   Barriers to Learning :   None evident   ED Home Meds Section :   Document assessment   [place] ED Fall Risk Section :   Document assessment   ED History Section :   Document assessment   ED Advance Directives Section :   Document assessment   ED Palliative Screen :   N/A (prefilled for <65yo)   Teressa Senter RN, Ander Purpura B - 04/17/2019 15:19 EDT   (As Of: 04/17/2019 15:20:23 EDT)   Problems(Active)    Alteration in comfort: pain (SNOMED CT  :45409811 )  Name of Problem:   Alteration in comfort: pain ; Recorder:   SYSTEM,  SYSTEM; Confirmation:   Confirmed ; Classification:   Interdisciplinary ; Code:   [id number] ; Last Updated:   03/28/2019 2:23 EDT ; Life Cycle Date:   03/28/2019 ; Life Cycle Status:   Active ; Vocabulary:   SNOMED CT   ; Comments:        03/28/2019 2:23 - SYSTEM,  SYSTEM  Problem added automatically by system based on initiation of Alteration in Comfort Plan of Care      Hypertension (IMO  :62130 )  Name of Problem:   Hypertension ; Recorder:   Okey Dupre, RN, JENNETTE; Confirmation:   Confirmed ; Classification:   Medical ; Code:   [phone] ; Contributor System:   Conservation officer, nature ; Last Updated:   11/08/2015 5:36 EST ; Life Cycle Date:   11/08/2015 ; Life Cycle Status:   Active ; Vocabulary:   IMO        Sickle cell (SNOMED CT  :46962952 )  Name of Problem:   Sickle cell ; Recorder:   ALBAYALDE, RN, JENNETTE; Confirmation:   Confirmed ; Classification:   Medical ; Code:   [id number] ; Contributor System:   Conservation officer, nature ; Last Updated:   11/08/2015 5:36 EST ; Life Cycle Date:   11/08/2015 ; Life Cycle Status:   Active ; Vocabulary:   SNOMED CT          Diagnoses(Active)    Fever  Date:   04/17/2019 ; Diagnosis Type:   Reason For Visit ; Confirmation:   Complaint of ; Clinical Dx:   Fever ; Classification:   Medical ; Clinical Service:   Non-Specified ; Code:   PNED  ; Probability:   0 ; Diagnosis Code:   [id number]             -    Procedure History   (As Of: 04/17/2019 15:20:23 EDT)     Anesthesia Minutes:   0 ; Procedure Name:   Port ; Procedure Minutes:   0            Anesthesia Minutes:   0 ; Procedure Name:   thoracotomy ; Procedure Minutes:   0            Anesthesia Minutes:   0 ; Procedure Name:   hernia repair ; Procedure Minutes:   0            Anesthesia Minutes:   0 ; Procedure Name:   splenectomy ; Procedure Minutes:   0            Anesthesia Minutes:   0 ;  Procedure Name:   Cholecystectomy; ; Procedure Minutes:   0            [facility] Fall Risk Assessment Tool   Hx of falling last 3 months ED Fall :   No   Patient confused or disoriented ED Fall :   No   Patient intoxicated or sedated ED Fall :   No   Patient impaired gait ED Fall :   No   Use a mobility assistance device ED Fall :   No   Patient altered elimination ED Fall :   No   [facility] ED Fall Score :   0    Malena PeerOLAN, RN, LAUREN B - 04/17/2019 15:19 EDT   ED Advance Directive   Advance Directive :   No   Lonni FixNOLAN, RN, Leotis ShamesLAUREN B - 04/17/2019 15:19 EDT   Social History   Social History   (As Of: 04/17/2019 15:20:23 EDT)   Tobacco:        Former smoker   (Last Updated: 11/08/2015 05:40:04 EST by Rock NephewALBAYALDE, RN, JENNETTE)          Alcohol:        Denies   (Last Updated: 03/27/2019 20:00:39 EDT by Justin MendONNELLY, RN, GAIL E)          Substance Use:        Opioid Complex, Current, Prescription medications, Marijuana   (Last Updated: 03/27/2019 20:04:05 EDT by Justin MendONNELLY, RN, GAIL E)            Med Hx   Medication List   (As Of: 04/17/2019 15:20:23 EDT)   Home Meds    albuterol  :   albuterol ; Status:   Documented ; Ordered As Mnemonic:   Proventil HFA 90 mcg/inh inhalation aerosol ; Simple Display Line:   2 puffs, Inhale, q4hr, PRN, 6.7 g, 0 Refill(s) ; Catalog Code:   albuterol ; Order Dt/Tm:   03/28/2019 09:31:25 EDT          bisacodyl  :   bisacodyl ; Status:   Documented ; Ordered As Mnemonic:   Dulcolax  Laxative 5 mg oral delayed release tablet ; Simple Display Line:   5 mg, 1 tabs, Oral, Daily, PRN, 0 Refill(s) ; Catalog Code:   bisacodyl ; Order Dt/Tm:   03/28/2019 09:31:31 EDT ; Comment:   DO NOT CRUSH          citalopram  :   citalopram ; Status:   Documented ; Ordered As Mnemonic:   CeleXA 40 mg oral tablet ; Simple Display Line:   40 mg, 1 tabs, Oral, Daily, 30 tabs, 0 Refill(s) ; Catalog Code:   citalopram ; Order Dt/Tm:   03/28/2019 09:26:41 EDT ; Comment:   SOUND ALIKE / LOOK ALIKE - VERIFY DRUG          clonazePAM  :   clonazePAM ; Status:   Documented ; Ordered As Mnemonic:   KlonoPIN 0.5 mg oral tablet ; Simple Display Line:   0.5 mg, 1 tabs, Oral, Once a Day (at bedtime), 0 Refill(s) ; Catalog Code:   clonazePAM ; Order Dt/Tm:   03/28/2019 09:26:46 EDT ; Comment:   SOUND ALIKE / LOOK ALIKE - VERIFY DRUG  SOUND ALIKE / LOOK ALIKE - VERIFY DRUG          diphenhydrAMINE  :   diphenhydrAMINE ; Status:   Documented ; Ordered As Mnemonic:   Benadryl 25 mg oral tablet ; Simple Display Line:   50 mg, 2 tabs,  Oral, PRN: prn, 0 Refill(s) ; Catalog Code:   diphenhydrAMINE ; Order Dt/Tm:   03/28/2019 09:31:07 EDT          docusate  :   docusate ; Status:   Documented ; Ordered As Mnemonic:   Colace 100 mg oral capsule ; Simple Display Line:   100 mg, 1 caps, Oral, BID, 0 Refill(s) ; Catalog Code:   docusate ; Order Dt/Tm:   03/28/2019 09:31:40 EDT          fentaNYL  :   fentaNYL ; Status:   Documented ; Ordered As Mnemonic:   fentaNYL 50 mcg/hr transdermal film, extended release ; Simple Display Line:   1 patches, TD, q72hr, 0 Refill(s) ; Catalog Code:   fentaNYL ; Order Dt/Tm:   03/28/2019 09:27:02 EDT          folic acid  :   folic acid ; Status:   Documented ; Ordered As Mnemonic:   folic acid 1 mg oral tablet ; Simple Display Line:   1 mg, 1 tabs, Oral, Daily, 0 Refill(s) ; Catalog Code:   folic acid ; Order Dt/Tm:   03/28/2019 60:45:4009:28:25 EDT          ibuprofen  :   ibuprofen ; Status:   Documented ; Ordered As  Mnemonic:   ibuprofen 800 mg oral tablet ; Simple Display Line:   800 mg, 1 tabs, Oral, TID, PRN: for pain, 30 tabs, 0 Refill(s) ; Catalog Code:   ibuprofen ; Order Dt/Tm:   03/28/2019 09:28:46 EDT          mirtazapine  :   mirtazapine ; Status:   Documented ; Ordered As Mnemonic:   Remeron 15 mg oral tablet ; Simple Display Line:   15 mg, 1 tabs, Oral, Once a Day (at bedtime), 30 tabs, 0 Refill(s) ; Catalog Code:   mirtazapine ; Order Dt/Tm:   03/28/2019 98:11:9109:28:52 EDT          oxyCODONE  :   oxyCODONE ; Status:   Documented ; Ordered As Mnemonic:   oxyCODONE 30 mg oral tablet ; Simple Display Line:   30 mg, 1 tabs, Oral, q4hr, PRN: for pain, 0 Refill(s) ; Catalog Code:   oxyCODONE ; Order Dt/Tm:   03/28/2019 09:29:01 EDT          promethazine  :   promethazine ; Status:   Documented ; Ordered As Mnemonic:   promethazine 25 mg oral tablet ; Simple Display Line:   25 mg, 1 tabs, Oral, q6hr, PRN: as needed for nausea/vomiting, 0 Refill(s) ; Catalog Code:   promethazine ; Order Dt/Tm:   03/28/2019 09:32:47 EDT          sodium bicarbonate  :   sodium bicarbonate ; Status:   Documented ; Ordered As Mnemonic:   sodium bicarbonate 650 mg oral tablet ; Simple Display Line:   650 mg, 1 tabs, Oral, TID, 0 Refill(s) ; Catalog Code:   sodium bicarbonate ; Order Dt/Tm:   03/28/2019 09:29:10 EDT          traZODone  :   traZODone ; Status:   Documented ; Ordered As Mnemonic:   traZODone 150 mg oral tablet ; Simple Display Line:   150 mg, 1 tabs, Oral, Once a Day (at bedtime), 30 tabs, 0 Refill(s) ; Catalog Code:   traZODone ; Order Dt/Tm:   03/28/2019 47:82:9509:29:27 EDT ; Comment:   DO NOT CRUSH

## 2019-04-17 NOTE — ED Provider Notes (Signed)
Sickle Cell Crisis *ED        Patient:   Judith [name], Judith [name]            MRN: [id number]            FIN: [id number]               Age:   33 years     Sex:  Female     DOB:  11-20-1985   Associated Diagnoses:   Sickle cell pain crisis; Fever   Author:   Wilian Kwong-MD,  Marijo Conception      Basic Information   Additional information: Chief Complaint from Nursing Triage Note   Chief Complaint  Chief Complaint: patient reports fever for a week and sickle cell crisis for 4 days. (04/17/19 15:12:00).      History of Present Illness   The patient presents with sickle cell crisis and sickle cell pain.  The onset was 4  days ago.  The course/duration of symptoms is constant.  Location: hips b/l. The character of symptoms is throbbing.  The degree of pain is severe.  The exacerbating factor is none.  The relieving factor is none.  Risk factors consist of none.  Prior episodes: similar to "sickle cell pain" and frequent.  Therapy today:.  Associated symptoms: shortness of breath, fever, chills, denies chest pain, denies abdominal pain, denies nausea and denies vomiting.  33 year old female with history of sickle cell anemia presents here with bilateral hip pain, fever, and shortness of breath.  The patient tells me that hip pain is somewhat unusual for her typical sickle cell pain crises.  She is able to ambulate and bear weight.  She is able to range the hip without difficulty.  She describes the onset of symptoms to be about 4 days ago.  She has been taking by mouth pain medication as prescribed by her doctor at [facility].  She had described chills but was unaware that she had a fever as she is febrile here in the ER.  She denies any cough but describe some shortness of breath.  She has had no contact with any COVID-19 patients.  She denies any abdominal pain or flank pain.  She denies any urinary symptoms. .        Review of Systems   Constitutional symptoms:  Negative except as documented in HPI.   Skin symptoms:  Negative except as documented in  HPI.   Eye symptoms:  Negative except as documented in HPI.   Respiratory symptoms:  Negative except as documented in HPI.   Cardiovascular symptoms:  Negative except as documented in HPI.   Gastrointestinal symptoms:  Negative except as documented in HPI.   Genitourinary symptoms:  Negative except as documented in HPI.   Musculoskeletal symptoms:  Negative except as documented in HPI.   Neurologic symptoms:  Negative except as documented in HPI.   Psychiatric symptoms:  Negative except as documented in HPI.             Additional review of systems information: All other systems reviewed and otherwise negative.      Health Status   Allergies:    Allergic Reactions (Selected)  Moderate  Bactrim- Itching.  Unknown  Contrast media (iodine-based)- No reactions were documented.  FentaNYL- No reactions were documented.  Morphine- No reactions were documented.  Rocephin- No reactions were documented..   Medications:  (Selected)   Documented Medications  Documented  Benadryl 25 mg oral tablet: 50 mg, 2 tabs, Oral, PRN:  prn, 0 Refill(s)  CeleXA 40 mg oral tablet: 40 mg, 1 tabs, Oral, Daily, 30 tabs, 0 Refill(s)  Colace 100 mg oral capsule: 100 mg, 1 caps, Oral, BID, 0 Refill(s)  Dulcolax Laxative 5 mg oral delayed release tablet: 5 mg, 1 tabs, Oral, Daily, PRN, 0 Refill(s)  KlonoPIN 0.5 mg oral tablet: 0.5 mg, 1 tabs, Oral, Once a Day (at bedtime), 0 Refill(s)  Proventil HFA 90 mcg/inh inhalation aerosol: 2 puffs, Inhale, q4hr, PRN, 6.7 g, 0 Refill(s)  Remeron 15 mg oral tablet: 15 mg, 1 tabs, Oral, Once a Day (at bedtime), 30 tabs, 0 Refill(s)  fentaNYL 50 mcg/hr transdermal film, extended release: 1 patches, TD, q72hr, 0 Refill(s)  folic acid 1 mg oral tablet: 1 mg, 1 tabs, Oral, Daily, 0 Refill(s)  ibuprofen 800 mg oral tablet: 800 mg, 1 tabs, Oral, TID, PRN: for pain, 30 tabs, 0 Refill(s)  oxyCODONE 30 mg oral tablet: 30 mg, 1 tabs, Oral, q4hr, PRN: for pain, 0 Refill(s)  promethazine 25 mg oral tablet: 25 mg, 1 tabs,  Oral, q6hr, PRN: as needed for nausea/vomiting, 0 Refill(s)  sodium bicarbonate 650 mg oral tablet: 650 mg, 1 tabs, Oral, TID, 0 Refill(s)  traZODone 150 mg oral tablet: 150 mg, 1 tabs, Oral, Once a Day (at bedtime), 30 tabs, 0 Refill(s).      Past Medical/ Family/ Social History   Medical history: Reviewed as documented in chart.   Surgical history: Reviewed as documented in chart.   Family history: Not significant.   Social history: Reviewed as documented in chart.   Problem list:    Active Problems (4)  Alteration in comfort: pain   Hypertension   Shortness of breath   Sickle cell   .      Physical Examination               Vital Signs   Vital Signs   04/12/3709 62:69 EDT Systolic Blood Pressure 485 mmHg    Diastolic Blood Pressure 66 mmHg    Heart Rate Monitored 90 bpm    Respiratory Rate 18 br/min    SpO2 97 %   4/62/7035 00:93 EDT Systolic Blood Pressure 818 mmHg    Diastolic Blood Pressure 73 mmHg    Heart Rate Monitored 94 bpm    Respiratory Rate 17 br/min    SpO2 99 %   2/99/3716 96:78 EDT Systolic Blood Pressure 938 mmHg    Diastolic Blood Pressure 84 mmHg    Temperature Oral 38.4 degC  HI    Heart Rate Monitored 97 bpm    Respiratory Rate 20 br/min    SpO2 100 %    Measurements   04/17/2019 15:19 EDT Body Mass Index est meas 23.05 kg/m2    Body Mass Index Measured 23.05 kg/m2   04/17/2019 15:12 EDT Height/Length Measured 160.0 cm    Weight Dosing 59 kg    Basic Oxygen Information   04/17/2019 17:00 EDT SpO2 97 %    Oxygen Therapy Room air   04/17/2019 16:24 EDT SpO2 99 %    Oxygen Therapy Room air   04/17/2019 15:12 EDT SpO2 100 %    Oxygen Therapy Room air    General:  Alert, no acute distress.    Skin:  Warm, dry, pink.    Head:  Normocephalic.   Neck:  Supple, trachea midline.    Eye:  Pupils are equal, round and reactive to light, extraocular movements are intact.    Ears, nose, mouth and throat:  Oral  mucosa moist.   Cardiovascular:  Regular rate and rhythm.   Respiratory:  Lungs are clear to auscultation,  respirations are non-labored.    Back:  Normal range of motion.   Musculoskeletal:  Normal ROM, no tenderness.    Chest wall   Gastrointestinal:  Soft, Nontender, Non distended, Normal bowel sounds, No organomegaly.    Neurological:  Alert and oriented to person, place, time, and situation, No focal neurological deficit observed, normal sensory observed, normal motor observed.    Lymphatics:  No lymphadenopathy.   Psychiatric:  Cooperative, appropriate mood & affect.       Medical Decision Making   Differential Diagnosis::  Sickle cell crisis, sickle cell disease, pneumonia, urinary tract infection, viral syndrome, aplastic crisis.    Rationale:  33 year old female with sickle cell anemia here with fever and bilateral hip pain.  Plan to obtain labs, reticulocyte count, COVID swab, UA, chest x-ray, bilateral hip x-ray.    Labs demonstrate a anemia which is somewhat near her baseline.  She has a significant leukocytosis at 17,000.  Procalcitonin added.    UA and XR studies pending    Covid negative. Marland Kitchen   Results review:  All Results   04/17/2019 19:03 EDT ED Note-Nursing     04/17/2019 19:00 EDT ED Note-Nursing     04/17/2019 18:07 EDT XR Chest 1 View Portable IMAGE (Modified)    XR Hip Bilateral w/Pelvis IMAGE (Modified)   04/17/2019 17:34 EDT ED Note-Physician Sickle Cell Crisis *ED (In Progress)   04/17/2019 15:19 EDT ED Triage Note     04/17/2019 15:12 EDT ED Triage Note     04/17/2019 19:05 EDT Respiratory Rate 16 br/min     Numeric Rating Pain Scale 7     Pasero Opioid Induced Sedation Scale 1=Awake and alert     Med Response Med Response     ED Medication Response No adverse reaction, Continue to observe for symptoms    04/17/2019 15:05 EDT Consent Forms     04/17/2019 19:03 EDT ED RN Progress Note RT chest port accessed by previous RN, Lonn Georgia    0/96/0454 09:81 EDT Systolic Blood Pressure 191 mmHg     Diastolic Blood Pressure 69 mmHg     Temperature Oral 37.2 degC     Heart Rate Monitored 90 bpm     Respiratory Rate 17  br/min     SpO2 98 %     Respiratory Symptoms Cough, Shortness of breath     Shortness of Breath Indicator Shortness of breath at rest, Shortness of breath with ordinary activity     Respirations Unlabored     Respiratory Pattern Regular     Chest Motion Symmetrical     Cough Non-Productive     Patient Airway Status Patent without support     Oxygen Therapy Room air     Skin Color General Usual for ethnicity     Skin Temperature Warm     Skin Moisture General Dry     Hip Bilateral      Musculoskeletal Abnormality: No abnormalities     Musculoskeletal Symptoms: Pain at rest, Pain with movement     Musculoskeletal Range of Motion: Full motion     Level of Consciousness - APVU Alert     Affect/Behavior Appropriate, Calm, Cooperative     Orientation Assessment Oriented x 4     ED RN Progress Note Pt laying in bed upon assessment. NAD noted. RR even and unlabored. Denies any needs at this time.  04/17/2019 19:00 EDT ED RN Progress Note Assumed care of pt from [place], [place]    04/26/3085 57:84 EDT Systolic Blood Pressure 696 mmHg     Diastolic Blood Pressure 81 mmHg     Heart Rate Monitored 89 bpm     Respiratory Rate 17 br/min     SpO2 99 %     Oxygen Therapy Room air    04/17/2019 17:33 EDT UA Color Yellow     UA Appear Clear     UA Glucose Negative     UA Bili Negative     UA Ketones Negative     UA Spec Grav 1.015     UA Blood Negative     UA pH 6.5     Protein U Trace     UA Urobilinogen 0.2 EU/dL     UA Nitrite Negative     UA Leuk Est Negative     WBC U 0-2 /HPF     RBC U 0-2 /HPF     Sq Epi U Moderate /LPF     Bacteria U Rare     Mucus U Not Seen /LPF     FACES Pain Scale 4 = Hurts little more     Pasero Opioid Induced Sedation Scale 1=Awake and alert     Pasero Opioid Induced Sedation Scale 1=Awake and alert     Med Response Med Response     Med Response Med Response     ED Medication Response No adverse reaction, Symptoms improved     ED Medication Response No adverse reaction, Symptoms improved    04/17/2019 17:32  EDT Preg U POC NEGATIVE    2/95/2841 32:44 EDT Systolic Blood Pressure 010 mmHg     Diastolic Blood Pressure 66 mmHg     Heart Rate Monitored 90 bpm     Respiratory Rate 18 br/min     SpO2 97 %     Oxygen Therapy Room air    04/17/2019 16:51 EDT Estimated Creatinine Clearance 93.64 mL/min    2/72/5366 44:03 EDT Systolic Blood Pressure 474 mmHg     Diastolic Blood Pressure 73 mmHg     Heart Rate Monitored 94 bpm     Respiratory Rate 17 br/min     SpO2 99 %     Oxygen Therapy Room air    04/17/2019 16:23 EDT WBC 17.7 x10e3/mcL  HI     RBC 2.47 x10e6/mcL  LOW     RBC 2.47 x10e6/mcL  LOW     Hgb 6.8 g/dL  CRIT     HCT 20.5 %  CRIT     MCV 83.0 fL     MCH 27.5 pg     MCHC 33.2 g/dL     RDW 18.7 %  HI     Platelet 587 x10e3/mcL  HI     MPV 9.8 fL     NRBC Absolute Auto 0.02 x10e3/mcL  NA     NRBC Percent Auto 0.1 %  NA     Neutrophil M Pct 61 %     Lymphcyte M Pct 32 %     Monocyte M Pct 4 %     Eosinophil M Pct 2 %     Basophil M Pct 1 %     Neutrophil M # 10.8 x10e3/mcL  HI     Lymphocyte M # 5.7 x10e3/mcL  HI     Monocyte M # 0.7 x10e3/mcL     Eosinophil M # 0.4 x10e3/mcL  Basophil M # 0.2 x10e3/mcL     Ovalocytes Few     PLT estimate Adequate     Poikilocytosis Moderate     RBC morphology Abnormal     Sickle cells Few     Target cells Few     Tear drop Few     Retic Percent 5.5 %  HI     Absolute Retic 0.1314 /mcL  HI     Sodium Lvl 136 mmol/L     Potassium Lvl 3.3 mmol/L  LOW     Chloride 112 mmol/L  HI     CO2 14 mmol/L  LOW     Glucose Random 98 mg/dL     BUN 11 mg/dL     Creatinine Lvl 0.7 mg/dL     AGAP 10 mmol/L     Osmolality Calc 271 mOsm/kg     Calcium Lvl 8.9 mg/dL     Protein Total 7.3 g/dL     Albumin Lvl 4.5 g/dL     Globulin Calc 2.8 g/dL     AG Ratio Calc 1.61 mmol/L     Alk Phos 100 unit/L     AST 15 unit/L     ALT 9 unit/L     eGFR AA 131 mL/min/1.10m???     eGFR Non-AA 113 mL/min/1.720m??     Bili Total 0.70 mg/dL     Lactic Acid Lvl 1.2 mmol/L     Procalcitonin 0.27 ng/mL  HI    04/17/2019 15:57 EDT  Ordered Isolation Status Initiated    04/17/2019 15:19 EDT Estimated Creatinine Clearance 163.88 mL/min     Body Mass Index est meas 23.05 kg/m2     Body Mass Index Measured 23.05 kg/m2    04/17/2019 15:19 EDT Hx of falling last 3 months ED Fall No     Patient confused of disoriented ED Fall No     Patient intoxicated or sedated ED Fall No     Patient have an impaired gait ED Fall No     Use a mobility assistance device ED Fall No     Patient altered elimination ED Fall No     [facility] ED Fall Score 0     Advanced Directives No     Barriers to Learning None evident    04/17/2019 15:12 EDT Height/Length Measured 160.0 cm     Weight Dosing 59 kg     Systolic Blood Pressure 13130mHg     Diastolic Blood Pressure 84 mmHg     Temperature Oral 38.4 degC  HI     Heart Rate Monitored 97 bpm     Respiratory Rate 20 br/min     SpO2 100 %     Numeric Rating Pain Scale 8     Cardiovascular Symptoms Shortness of breath     Respiratory Symptoms Cough, Shortness of breath     Shortness of Breath Indicator Shortness of breath sitting straight up, Shortness of breath with ordinary activity, Shortness of breath with exercise, Shortness of breath after reclining at night     Respirations Unlabored     Respiratory Pattern Regular     Chest Motion Symmetrical     Cough Able to clear secretions, Non-Productive     Patient Airway Status Patent without support     Oxygen Therapy Room air     Pregnancy Status Patient denies     Skin Color General Usual for ethnicity     Skin Temperature Warm     Skin Moisture General  Dry     Level of Consciousness - APVU Alert     Affect/Behavior Appropriate, Calm, Cooperative     Orientation Assessment Oriented x 4     Last 3 mo, thoughts killing self/others Patient denies     SIRS Possible Source of Infectionn Yes     SIRS Source of Infection Fever >/= 38.0 C or </= 36.0 C     ED Behavioral Activity Rating Scale 4 - Quiet and awake (normal level of activity)     Recent Travel History ID No recent travel      Fever OR Chills ID Yes     Diarrhea ID Yes     Headache ID Yes     TB Symptom Screen ID No symptoms     C. diff Symptom/History ID Neither of the above     Last 14 days COVID-19 ID No     Nausea ID Yes     Fatigue ID Yes     Radiology results:  Rad Results (ST)   XR Hip Bilateral w/Pelvis  ?  04/17/19 18:26:37  XR Hip Bilateral w/Pelvis 2 views 04/17/19    COMPARISON: None    INDICATION: Pain, Joint, Hip, Pelvis or Thigh,    FINDINGS: AP pelvis and frog-leg lateral views of both hips and pelvis  demonstrate no evidence of fracture or dislocation. No definite bone infarcts  seen. No definite AVN by plain film. Bones and soft tissues unremarkable.      IMPRESSION: Normal bilateral hips and bony pelvis  ?  Signed By: Lynelle Doctor  ?  **************************************************  XR Chest 1 View Portable  ?  04/17/19 18:25:00  XR Chest 1 View Portable    04/17/19    COMPARISON: 03/27/2019    INDICATION: Fever, sickle cell crisis    FINDINGS: Single view of the chest demonstrates cardiomegaly. The lungs are  otherwise clear. No evidence of pneumonia. Infuse-a-Port catheter in place in  the right IJ. No pulmonary nodule or mass. No evidence of CHF. No pleural  effusion. Bones and soft tissues remarkable for increased density consistent  with a history of sickle cell disease.      IMPRESSION: Cardiomegaly but otherwise no evidence of pneumonia or CHF.  ?  Signed By: Lynelle Doctor  .      Reexamination/ Reevaluation   Vital signs   Basic Oxygen Information   04/17/2019 17:00 EDT SpO2 97 %    Oxygen Therapy Room air   04/17/2019 16:24 EDT SpO2 99 %    Oxygen Therapy Room air   04/17/2019 15:12 EDT SpO2 100 %    Oxygen Therapy Room air      Notes: Procal mildly elevated. This in combination with leukocytosis may suggest bacterial infection. CXR clear. UA neg. She has no abdominal pain nor tenderness on exam. I will hold antibiotic therapy at this time as she is clinically stable. Plan for admission to  hospitalist. .      Impression and Plan   Diagnosis   Sickle cell pain crisis (ICD10-CM D57.00, Discharge, Medical)   Fever (ICD10-CM R50.9, Discharge, Medical)   Plan   Condition: Stable.    Disposition: Admit to Inpatient Unit.    Counseled: Patient.    Signature Line     Electronically Signed on 04/17/2019 07:41 PM EDT   ________________________________________________   Ritisha Deitrick-MD, Marijo Conception               Modified by: Whitman Hero on 04/17/2019 05:37 PM EDT  Modified by: Whitman Hero on 04/17/2019 07:41 PM EDT

## 2019-04-17 NOTE — ED Notes (Signed)
ED Note-Nursing       ED RN Reassessment Entered On:  04/17/2019 19:58 EDT    Performed On:  04/17/2019 19:55 EDT by Aldine Contes, RN, Samuel Germany               ED RN Reassessment   ED RN Progress Note :   Report called to [place], RN on [address] [facility]   [place], [place], [place] A - 04/17/2019 19:57 EDT

## 2019-04-17 NOTE — ED Notes (Signed)
ED Patient Summary              [facility] Emergency Department  [address], [place] [address]  [phone]  Discharge Instructions (Patient)  _______________________________________     Name: Judith [name], Judith [name]  DOB: May 26, 1986                   MRN: [id number]                   FIN: [phone]  Reason For Visit: Fever; SICKLE CELL CRISIS  Final Diagnosis: Fever; Sickle cell pain crisis     Visit Date: 04/17/2019 15:05:00  Address: [address] [place] [address]  Phone: [phone]     Primary Care Provider:      Name: PCP,  NONE      Phone:         Emergency Department Providers:         Primary Physician:   DITTRICH-MD, Blinda Leatherwood would like to thank you for allowing [place] to assist you with your healthcare needs. The following includes patient education materials and information regarding your injury/illness.     Follow-up Instructions: You were treated today on an emergency basis, it may be wise to contact your primary care provider to notify them of your visit today. You may have been referred to your regular doctor or a specialist, please follow up as instructed. If your condition worsens or you can't get in to see the doctor, contact the Emergency Department.       Printed Prescriptions:    Patient Education Materials:  Discharge Orders       Comment:             Allergy Info: fentaNYL; contrast media (iodine-based); Rocephin; Bactrim; morphine     Medication Information:  [facility] ED Physicians provided you with a complete list of medications post discharge, if you have been instructed to stop taking a medication please ensure you also follow up with this information to your Primary Care Physician.  Unless otherwise noted, patient will continue to take medications as prescribed prior to the Emergency Room visit.  Any specific questions regarding your chronic medications and dosages should be discussed with your physician(s) and  pharmacist.          albuterol (Proventil HFA 90 mcg/inh inhalation aerosol) 2 Puffs Inhale (breathe in) every 4 hours as needed.  bisacodyl (Dulcolax Laxative 5 mg oral delayed release tablet) 1 Tabs Oral (given by mouth) every day as needed., DO NOT CRUSH  citalopram (CeleXA 40 mg oral tablet) 1 Tabs Oral (given by mouth) every day., SOUND ALIKE / LOOK ALIKE - VERIFY DRUG  clonazePAM (KlonoPIN 0.5 mg oral tablet) 1 Tabs Oral (given by mouth) Once a Day (at bedtime)., SOUND ALIKE / LOOK ALIKE - VERIFY DRUG   SOUND ALIKE / LOOK ALIKE - VERIFY DRUG  cyclobenzaprine (cyclobenzaprine 5 mg oral tablet) 1 Tabs Oral (given by mouth) 3 times a day for 14 Days., " THIS MEDICATION IS ASSOCIATED  WITH  AN INCREASED RISK OF FALLS."  diphenhydrAMINE (Benadryl 25 mg oral tablet) 2 Tabs Oral (given by mouth) as needed prn.  docusate (Colace 100 mg oral capsule) 1 Capsules Oral (given by mouth) 2 times a day as needed as needed for constipation.  fentaNYL (fentaNYL 50 mcg/hr transdermal film, extended release) 1 Patches Transdermal (apply on the skin) every 72  hours.  folic acid (folic acid 1 mg oral tablet) 1 Tabs Oral (given by mouth) every day.  ibuprofen (ibuprofen 800 mg oral tablet) 1 Tabs Oral (given by mouth) 3 times a day as needed for pain.  mirtazapine (Remeron 15 mg oral tablet) 1 Tabs Oral (given by mouth) Once a Day (at bedtime).  oxyCODONE (oxyCODONE 30 mg oral tablet) 1 Tabs Oral (given by mouth) every 4 hours as needed for pain.  promethazine (promethazine 25 mg oral tablet) 1 Tabs Oral (given by mouth) every 6 hours as needed as needed for nausea/vomiting.  sodium bicarbonate (sodium bicarbonate 325 mg oral tablet) 1 Tabs Oral (given by mouth) 3 times a day as needed for indigestion.      Medications Administered During Visit:              Medication Dose Route   Sodium Chloride 0.9% 1000 mL IV Piggyback   acetaminophen 1000 mg Oral   diphenhydrAMINE 25 mg IV Push   hydromorphone 1 mg IV Push   promethazine 12.5  mg IV Push   hydromorphone 1 mg IV Push   diphenhydrAMINE 25 mg IV Push          Major Tests and Procedures:  The following procedures and tests were performed during your ED visit.  COMMON PROCEDURES%>  COMMON PROCEDURES COMMENTS%>          Laboratory Orders  Name Status Details   .Preg U POC Completed Urine, RT, RT - Routine, Collected, 04/17/19 17:32:00 EDT, Nurse collect, 04/17/19 17:32:00 US/Eastern, RAL POC Login   Add On Completed Blood, Stat, ST - Stat, Collected, 04/17/19 17:30:00 EDT, 04/17/19 17:30:00 EDT, DITTRICH-MD,  Maisie Fus C, Print label Y/N, procalcitonin, Draw Stat   BMP Ordered Blood, 5 AM Draw, RT - Routine, 04/18/19 5:00:00 EDT, Daily 5 days, 04/23/19 4:59:00 EDT, Nurse collect, Aliene Beams D, Print label Y/N   BMP Ordered Blood, 5 AM Draw, RT - Routine, 04/18/19 5:00:00 EDT, 04/18/19 5:00:00 EDT, Nurse collect, Azalia Bilis, Print label Y/N   C Blood Ordered Blood, Line Central, Stat, ST - Stat, 04/17/19 15:42:00 EDT, Once, 04/17/19 16:23:00 EDT, Nurse collect, 2 of 2   C Blood Ordered Blood, Line Central, Stat, ST - Stat, 04/17/19 15:42:00 EDT, Once, 04/17/19 16:03:00 EDT, Nurse collect, 1 of 2   C Urine Ordered Urine, Clean Catch, Stat, ST - Stat, 04/17/19 15:39:00 EDT, Once, 04/17/19 15:40:00 EDT, Nurse collect, Print label Y/N   CBCDIFF Ordered Blood, 5 AM Draw, RT - Routine, 04/18/19 5:00:00 EDT, Daily 5 days, 04/23/19 4:59:00 EDT, Nurse collect, Aliene Beams D, Print label Y/N   CBCDIFF Ordered Blood, 5 AM Draw, RT - Routine, 04/18/19 5:00:00 EDT, 04/18/19 5:00:00 EDT, Nurse collect, Aliene Beams D, Print label Y/N   CBCMDIFF Completed Blood, Stat, ST - Stat, 04/17/19 15:26:00 EDT, 04/17/19 15:27:00 EDT, Nurse collect, DITTRICH-MD,  Maisie Fus C, Print label Y/N   CMP Completed Blood, Stat, ST - Stat, 04/17/19 15:26:00 EDT, 04/17/19 15:27:00 EDT, Nurse collect, DITTRICH-MD,  Jesse Sans, Print label Y/N   [place]ona SARs Completed Nasopharyngeal Swab, Stat, ST -  Stat, 04/17/19 15:26:00 EDT, 04/17/19 15:27:00 EDT, Nurse collect, DITTRICH-MD,  Maisie Fus C, Print label Y/N   Lactic Completed Blood, Stat, ST - Stat, 04/17/19 15:42:00 EDT, 04/17/19 15:42:00 EDT, Nurse collect, DITTRICH-MD,  Jesse Sans, Print label Y/N   Procal Completed Blood, Stat, ST - Stat, Collected, 04/17/19 16:23:00 EDT [id number], 04/17/19 16:23:00 EDT, Nurse collect, Venous Draw, 04/17/19 17:31:00 EDT,  SF CP Login, DITTRICH-MD,  THOMAS C, Print label Y/N, sf_lab_accession_3, 4 mL SST PST/*A*/   Retic Auto Completed Blood, Stat, ST - Stat, 04/17/19 15:26:00 EDT, 04/17/19 15:27:00 EDT, Nurse collect, DITTRICH-MD,  Maisie Fus C, Print label Y/N   UA w Mic Completed Urine, Clean Catch, Stat, ST - Stat, 04/17/19 15:39:00 EDT, 04/17/19 15:40:00 EDT, Nurse collect   Joselyn Glassman Completed Blood, ST, ST - Stat, Collected, 04/17/19 16:23:00 EDT [id number], 04/17/19 16:23:00 EDT, Venous Draw, 04/17/19 16:31:00 EDT, SF CP Login, BLYTCH, Print label Y/N, sf_lab_accession_3, Complete   XTube SST Completed Blood, ST, ST - Stat, Collected, 04/17/19 16:23:00 EDT [id number], 04/17/19 16:23:00 EDT, Venous Draw, 04/17/19 16:32:00 EDT, SF CP Login, [email or web address], Print label Y/N, sf_lab_accession_3, Complete               Radiology Orders  Name Status Details   XR Chest 1 View Portable Completed 04/17/19 15:39:00 EDT, STAT 1 hour or less, Reason: Fever, Transport Mode: Portable, pp_set_radiology_subspecialty   XR Hip Bilateral w/Pelvis Completed 04/17/19 15:40:00 EDT, STAT 1 hour or less, Reason: Pain, Joint, Hip, Pelvis or Thigh, fever, hip pain, sickle cell, Transport Mode: STRETCHER, pp_set_radiology_subspecialty               Patient Care Orders  Name Status Details   Bed Request Communication Ordered 04/17/19 19:16:00 EDT med/surg, sickle cell crisis, Azalia Bilis   COVID-19 Status Ordered 04/17/19 15:27:05 EDT, NOT VALID FOR pharmacy, laboratory, radiology., 04/17/19 15:27:05 EDT, COVID-19 Not Detected   Change attending to Ordered  04/17/19 19:16:00 EDT, Aliene Beams D   Communication to Nursing Ordered 04/17/19 19:42:00 EDT, RD may manage/modify diet order and/or enteral nutrition per approved MNT protocol, 04/17/19 19:42:00 EDT, 04/17/19 19:42:00 EDT   DC ISO Order/Icons Ordered 04/17/19 17:20:45 EDT, 04/17/19 17:20:45 EDT   ED Assessment Adult Completed 04/17/19 15:19:42 EDT, 04/17/19 15:19:42 EDT   ED Secondary Triage Completed 04/17/19 15:19:42 EDT, 04/17/19 15:19:42 EDT   ED Triage Adult Completed 04/17/19 15:05:20 EDT, 04/17/19 15:05:20 EDT   Incentive Spirometry Nursing Ordered 04/18/19 2:00:00 EDT, While Awake   Incentive Spirometry Nursing Ordered 04/18/19 6:00:00 EDT, While Awake   Incentive Spirometry Nursing Ordered 04/18/19 10:00:00 EDT, While Awake   Incentive Spirometry Nursing Ordered 04/18/19 14:00:00 EDT, While Awake   Incentive Spirometry Nursing Ordered 04/18/19 18:00:00 EDT, While Awake   Incentive Spirometry Nursing Ordered 04/18/19 22:00:00 EDT, While Awake   Incentive Spirometry Nursing Ordered 04/17/19 22:00:00 EDT, While Awake   Incentive Spirometry Nursing Ordered 04/17/19 19:42:00 EDT, q4hr, While Awake   Inpatient 30 Day Readmission Ordered 04/17/19 15:05:20 EDT, This message can only be seen by Nursing, it is not visible to Pharmacy, Laboratory, or Radiology., 04/17/19 15:05:20 EDT   Intake and Output Ordered 04/17/19 19:42:00 EDT, 04/17/19 19:42:00 EDT, Per Unit Protocol   Intermittent pneumatic compression devices Ordered 04/17/19 19:42:00 EDT, Constant Order   Notify Provider Completed 04/17/19 15:27:05 EDT, This message can only be seen by Nursing, it is not visible to Pharmacy, Laboratory, or Radiology., 04/17/19 15:27:05 EDT   Notify Rapid Response Team Ordered 04/17/19 19:42:00 EDT, For concerns regarding patient condition & notify MD, 04/17/19 19:42:00 EDT, 04/17/19 19:42:00 EDT   Out of Bed Ordered 04/17/19 19:42:00 EDT, PRN, to chair   POC-Urine Pregnancy Test collect Completed 04/17/19 15:39:00  EDT, Once, 04/17/19 15:39:00 EDT   Pulse Oximetry Ordered 04/17/19 19:42:00 EDT, on room air   Pulse Oximetry Ordered 04/18/19 9:00:00 EDT, on room air   Pulse Oximetry Ordered 04/17/19 19:42:00 EDT,  Daily, on room air   Resuscitation Status Ordered 04/17/19 19:42:00 EDT, Full Resuscitation, Constant Order   VTE Quality Measures Ordered 04/17/19 19:17:02 EDT, 04/17/19 19:17:02 EDT   Vital Signs Ordered 04/18/19 10:00:00 EDT   Vital Signs Ordered 04/18/19 6:00:00 EDT   Vital Signs Ordered 04/18/19 2:00:00 EDT   Vital Signs Ordered 04/17/19 22:00:00 EDT   Vital Signs Ordered 04/18/19 18:00:00 EDT   Vital Signs Ordered 04/18/19 22:00:00 EDT   Vital Signs Ordered 04/17/19 19:42:00 EDT, q4hr   Vital Signs Ordered 04/18/19 14:00:00 EDT       ---------------------------------------------------------------------------------------------------------------------  [facility] allows you to manage your health, view your test results, and retrieve your discharge documents from your hospital stay securely and conveniently from your computer.     To begin the enrollment process, visit [email or web address] Click on "Sign up now" under [facility].   Comment:

## 2019-04-17 NOTE — ED Notes (Signed)
ED Note-Nursing       ED RN Reassessment Entered On:  04/17/2019 19:04 EDT    Performed On:  04/17/2019 19:00 EDT by Hino, RN, Brittany A               ED RN Reassessment   ED RN Progress Note :   Assumed care of pt from Kayla, RN   Hino, RN, Brittany A - 04/17/2019 19:04 EDT

## 2019-04-17 NOTE — ED Notes (Signed)
ED Triage Note       ED Triage Adult Entered On:  04/17/2019 15:19 EDT    Performed On:  04/17/2019 15:12 EDT by Lonni FixNOLAN, RN, Leotis ShamesLAUREN B               Triage   Chief Complaint :   patient reports fever for a week and sickle cell crisis for 4 days.    Numeric Rating Pain Scale :   8   [place] Mode of Arrival :   Private vehicle   Infectious Disease Documentation :   Document assessment   Temperature Oral :   38.4 degC(Converted to: 101.1 degF)  (HI)    Heart Rate Monitored :   97 bpm   Respiratory Rate :   20 br/min   Systolic Blood Pressure :   137 mmHg   Diastolic Blood Pressure :   84 mmHg   SpO2 :   100 %   Oxygen Therapy :   Room air   Patient presentation :   Fever >/= 38.0 C or </= 36.0 C   Chief Complaint or Presentation suggest infection :   Yes   Dosing Weight Obtained By :   Patient stated   Weight Dosing :   59 kg(Converted to: 130 lb 1 oz)    Height :   160.0 cm(Converted to: 5 ft 3 in)    Body Mass Index Dosing :   23 kg/m2   Malena PeerOLAN, RN, LAUREN B - 04/17/2019 15:12 EDT   DCP GENERIC CODE   Tracking Acuity :   3   Tracking Group :   ED [id number] [employer]   Malena PeerOLAN, RN, LAUREN B - 04/17/2019 15:12 EDT   ED General Section :   Document assessment   Pregnancy Status :   Patient denies   ED Allergies Section :   Document assessment   ED Reason for Visit Section :   Document assessment   Lonni FixOLAN, RMaceo Pro, LAUREN B - 04/17/2019 15:12 EDT   ID Risk Screen Symptoms   Recent Travel History :   No recent travel   Close Contact with COVID-19 ID :   Yes   Last 14 days COVID-19 ID :   No   TB Symptom Screen :   No symptoms   C. diff Symptom/History ID :   Neither of the above   WeskanNOLAN, RN, Leotis ShamesLAUREN B - 04/17/2019 15:12 EDT   ID COVID-19 Screen   Fever OR Chills :   Yes   Headache :   Yes   Fatigue :   Yes   Shortness of Breath ID :   Yes   Diarrhea :   Yes   Nausea :   Yes   NOLAN, RN, LAUREN B - 04/17/2019 15:12 EDT   Allergies   (As Of: 04/17/2019 15:19:41 EDT)   Allergies (Active)   Bactrim  Estimated Onset Date:   Unspecified ;  Reactions:   Itching ; Created By:   Lonni FixNOLAN, RN, Leotis ShamesLAUREN B; Reaction Status:   Active ; Category:   Drug ; Substance:   Bactrim ; Type:   Allergy ; Severity:   Moderate ; Updated By:   Malena PeerNOLAN, RN, LAUREN B; Reviewed Date:   04/17/2019 15:19 EDT      contrast media (iodine-based)  Estimated Onset Date:   Unspecified ; Created ByRock Nephew:   ALBAYALDE, RN, JENNETTE; Reaction Status:   Active ; Category:   Drug ; Substance:   contrast media (iodine-based) ; Type:  Allergy ; Severity:   Unknown ; Updated By:   Recardo Evangelist; Reviewed Date:   03/27/2019 19:59 EDT      fentaNYL  Estimated Onset Date:   Unspecified ; Created By:   Okey Dupre RN, JENNETTE; Reaction Status:   Active ; Category:   Drug ; Substance:   fentaNYL ; Type:   Allergy ; Severity:   Unknown ; Updated By:   Recardo Evangelist; Reviewed Date:   03/27/2019 19:59 EDT      morphine  Estimated Onset Date:   Unspecified ; Created ByOkey Dupre, RN, JENNETTE; Reaction Status:   Active ; Category:   Drug ; Substance:   morphine ; Type:   Allergy ; Severity:   Unknown ; Updated By:   Recardo Evangelist; Reviewed Date:   03/27/2019 19:59 EDT      Rocephin  Estimated Onset Date:   Unspecified ; Created By:   Okey Dupre RN, JENNETTE; Reaction Status:   Active ; Category:   Drug ; Substance:   Rocephin ; Type:   Allergy ; Severity:   Unknown ; Updated By:   Recardo Evangelist; Reviewed Date:   03/27/2019 19:59 EDT        Psycho-Social   Last 3 mo, thoughts killing self/others :   Patient denies   Feels Unsafe at Home :   No   ED Behavioral Activity Rating Scale :   4 - Quiet and awake (normal level of activity)   Teressa Senter, RN, Arsenio Katz - 04/17/2019 15:12 EDT   ED Reason for Visit   (As Of: 04/17/2019 15:19:41 EDT)   Problems(Active)    Alteration in comfort: pain (SNOMED CT  :02725366 )  Name of Problem:   Alteration in comfort: pain ; Recorder:   SYSTEM,  SYSTEM; Confirmation:   Confirmed ; Classification:   Interdisciplinary ; Code:   [id number] ; Last  Updated:   03/28/2019 2:23 EDT ; Life Cycle Date:   03/28/2019 ; Life Cycle Status:   Active ; Vocabulary:   SNOMED CT   ; Comments:        03/28/2019 2:23 - SYSTEM,  SYSTEM  Problem added automatically by system based on initiation of Alteration in Comfort Plan of Care      Hypertension (IMO  :59563 )  Name of Problem:   Hypertension ; Recorder:   Okey Dupre, RN, JENNETTE; Confirmation:   Confirmed ; Classification:   Medical ; Code:   [phone] ; Contributor System:   Conservation officer, nature ; Last Updated:   11/08/2015 5:36 EST ; Life Cycle Date:   11/08/2015 ; Life Cycle Status:   Active ; Vocabulary:   IMO        Sickle cell (SNOMED CT  :33295188 )  Name of Problem:   Sickle cell ; Recorder:   ALBAYALDE, RN, JENNETTE; Confirmation:   Confirmed ; Classification:   Medical ; Code:   [phone] ; Contributor System:   Conservation officer, nature ; Last Updated:   11/08/2015 5:36 EST ; Life Cycle Date:   11/08/2015 ; Life Cycle Status:   Active ; Vocabulary:   SNOMED CT          Diagnoses(Active)    Fever  Date:   04/17/2019 ; Diagnosis Type:   Reason For Visit ; Confirmation:   Complaint of ; Clinical Dx:   Fever ; Classification:   Medical ; Clinical Service:   Non-Specified ; Code:   PNED ; Probability:   0 ; Diagnosis Code:   [id number]

## 2019-04-17 NOTE — Discharge Summary (Signed)
 ED Clinical Summary                     [facility]  [address]  [place], [place] [address]  [phone]          PERSON INFORMATION  Name: Judith [name], Judith [name] Age:  33 Years DOB: 04/25/1986   Sex: Female Language: English PCP: PCP,  NONE   Marital Status: Single Phone: [phone] Med Service: MED-Medicine   MRN: [id number] Acct# [id number] Arrival: 04/17/2019 19:16:00   Visit Reason: Fever; SICKLE CELL CRISIS Acuity: 3 LOS: 000 04:55   Address:    [address] [place] [address]   Diagnosis:    Fever; Sickle cell pain crisis  Medications:          Medications That Were Updated - Follow Current Instructions  Other Medications  Current sodium bicarbonate (sodium bicarbonate 325 mg oral tablet) 1 Tabs Oral (given by mouth) 3 times a day as needed for indigestion.  Last Dose:____________________    Medications that have not changed  Other Medications  albuterol (Proventil HFA 90 mcg/inh inhalation aerosol) 2 Puffs Inhale (breathe in) every 4 hours as needed.  Last Dose:____________________  bisacodyl (Dulcolax Laxative 5 mg oral delayed release tablet) 1 Tabs Oral (given by mouth) every day as needed., DO NOT CRUSH  Last Dose:____________________  citalopram (CeleXA 40 mg oral tablet) 1 Tabs Oral (given by mouth) every day., SOUND ALIKE / LOOK ALIKE - VERIFY DRUG  Last Dose:____________________  clonazePAM (KlonoPIN 0.5 mg oral tablet) 1 Tabs Oral (given by mouth) Once a Day (at bedtime)., SOUND ALIKE / LOOK ALIKE - VERIFY DRUG   SOUND ALIKE / LOOK ALIKE - VERIFY DRUG  Last Dose:____________________  cyclobenzaprine (cyclobenzaprine 5 mg oral tablet) 1 Tabs Oral (given by mouth) 3 times a day for 14 Days.,  THIS MEDICATION IS ASSOCIATED  WITH  AN INCREASED RISK OF FALLS.  Last Dose:____________________  diphenhydrAMINE (Benadryl 25 mg oral tablet) 2 Tabs Oral (given by mouth) as needed prn.  Last Dose:____________________  docusate (Colace 100 mg oral capsule) 1 Capsules Oral  (given by mouth) 2 times a day as needed as needed for constipation.  Last Dose:____________________  fentaNYL (fentaNYL 50 mcg/hr transdermal film, extended release) 1 Patches Transdermal (apply on the skin) every 72 hours.  Last Dose:____________________  folic acid (folic acid 1 mg oral tablet) 1 Tabs Oral (given by mouth) every day.  Last Dose:____________________  ibuprofen (ibuprofen 800 mg oral tablet) 1 Tabs Oral (given by mouth) 3 times a day as needed for pain.  Last Dose:____________________  mirtazapine (Remeron 15 mg oral tablet) 1 Tabs Oral (given by mouth) Once a Day (at bedtime).  Last Dose:____________________  oxyCODONE (oxyCODONE 30 mg oral tablet) 1 Tabs Oral (given by mouth) every 4 hours as needed for pain.  Last Dose:____________________  promethazine (promethazine 25 mg oral tablet) 1 Tabs Oral (given by mouth) every 6 hours as needed as needed for nausea/vomiting.  Last Dose:____________________  No Longer Take the Following Medications  traZODone (traZODone 150 mg oral tablet) 1 Tabs Oral (given by mouth) Once a Day (at bedtime)., DO NOT CRUSH  Stop Taking Reason:       Medications Administered During Visit:                Medication Dose Route   Sodium Chloride 0.9% 1000 mL IV Piggyback   acetaminophen 1000 mg Oral   diphenhydrAMINE 25 mg IV Push  hydromorphone 1 mg IV Push   promethazine 12.5 mg IV Push   hydromorphone 1 mg IV Push   diphenhydrAMINE 25 mg IV Push               Allergies      Rocephin      fentaNYL      morphine      contrast media (iodine-based)      Bactrim (Itching)      Major Tests and Procedures:  The following procedures and tests were performed during your ED visit.  COMMON PROCEDURES%>  COMMON PROCEDURES COMMENTS%>                PROVIDER INFORMATION               Provider Role Assigned Sampson   DITTRICH-MD, DEBBY BROCKS ED Provider 04/17/2019 15:21:21    Verlie RN, Ileana CROME ED Nurse 04/17/2019 15:41:59    Bradly, RN, Laymon LABOR ED Nurse 04/17/2019 19:03:53         Attending Physician:  LORINE RUSH D      Admit Doc  LORINE RUSH BIRCH     Consulting Doc  LORINE RUSH BIRCH     VITALS INFORMATION  Vital Sign Triage Latest   Temp Oral ORAL_1%> ORAL%>   Temp Temporal TEMPORAL_1%> TEMPORAL%>   Temp Intravascular INTRAVASCULAR_1%> INTRAVASCULAR%>   Temp Axillary AXILLARY_1%> AXILLARY%>   Temp Rectal RECTAL_1%> RECTAL%>   02 Sat 100 % 98 %   Respiratory Rate RATE_1%> RATE%>   Peripheral Pulse Rate PULSE RATE_1%> PULSE RATE%>   Apical Heart Rate HEART RATE_1%> HEART RATE%>   Blood Pressure BLOOD PRESSURE_1%>/ BLOOD PRESSURE_1%>84 mmHg BLOOD PRESSURE%> / BLOOD PRESSURE%>69 mmHg                 Immunizations      No Immunizations Documented This Visit          DISCHARGE INFORMATION   Discharge Disposition: S Outpt-Admitted As Inpatient   [facility]   Discharge Date and Time:     ED Checkout Date and Time:  04/17/2019 20:00:00     DEPART REASON INCOMPLETE INFORMATION             Problems      Active           Hypertension          Sickle cell              Smoking Status      Former smoker         PATIENT EDUCATION INFORMATION  Instructions:          Follow up:            ED PROVIDER DOCUMENTATION     Patient:   Judith [name], Judith [name]            MRN: [id number]            FIN: [id number]               Age:   83 years     Sex:  Female     DOB:  [birth year]   Associated Diagnoses:   Sickle cell pain crisis; Fever   Author:   DITTRICH-MD,  DEBBY BROCKS      Basic Information   Additional information: Chief Complaint from Nursing Triage Note   Chief Complaint  Chief Complaint: patient reports fever for a week and sickle cell crisis for  4 days. (04/17/19 15:12:00).      History of Present Illness   The patient presents with sickle cell crisis and sickle cell pain.  The onset was 4  days ago.  The course/duration of symptoms is constant.  Location: hips b/l. The character of symptoms is throbbing.  The degree of pain is severe.  The exacerbating factor is  none.  The relieving factor is none.  Risk factors consist of none.  Prior episodes: similar to sickle cell pain and frequent.  Therapy today:.  Associated symptoms: shortness of breath, fever, chills, denies chest pain, denies abdominal pain, denies nausea and denies vomiting.  32 year old female with history of sickle cell anemia presents here with bilateral hip pain, fever, and shortness of breath.  The patient tells me that hip pain is somewhat unusual for her typical sickle cell pain crises.  She is able to ambulate and bear weight.  She is able to range the hip without difficulty.  She describes the onset of symptoms to be about 4 days ago.  She has been taking by mouth pain medication as prescribed by her doctor at [facility].  She had described chills but was unaware that she had a fever as she is febrile here in the ER.  She denies any cough but describe some shortness of breath.  She has had no contact with any COVID-19 patients.  She denies any abdominal pain or flank pain.  She denies any urinary symptoms. .        Review of Systems   Constitutional symptoms:  Negative except as documented in HPI.   Skin symptoms:  Negative except as documented in HPI.   Eye symptoms:  Negative except as documented in HPI.   Respiratory symptoms:  Negative except as documented in HPI.   Cardiovascular symptoms:  Negative except as documented in HPI.   Gastrointestinal symptoms:  Negative except as documented in HPI.   Genitourinary symptoms:  Negative except as documented in HPI.   Musculoskeletal symptoms:  Negative except as documented in HPI.   Neurologic symptoms:  Negative except as documented in HPI.   Psychiatric symptoms:  Negative except as documented in HPI.             Additional review of systems information: All other systems reviewed and otherwise negative.      Health Status   Allergies:    Allergic Reactions (Selected)  Moderate  Bactrim- Itching.  Unknown  Contrast media (iodine-based)- No reactions were  documented.  FentaNYL- No reactions were documented.  Morphine- No reactions were documented.  Rocephin- No reactions were documented..   Medications:  (Selected)   Documented Medications  Documented  Benadryl 25 mg oral tablet: 50 mg, 2 tabs, Oral, PRN: prn, 0 Refill(s)  CeleXA 40 mg oral tablet: 40 mg, 1 tabs, Oral, Daily, 30 tabs, 0 Refill(s)  Colace 100 mg oral capsule: 100 mg, 1 caps, Oral, BID, 0 Refill(s)  Dulcolax Laxative 5 mg oral delayed release tablet: 5 mg, 1 tabs, Oral, Daily, PRN, 0 Refill(s)  KlonoPIN 0.5 mg oral tablet: 0.5 mg, 1 tabs, Oral, Once a Day (at bedtime), 0 Refill(s)  Proventil HFA 90 mcg/inh inhalation aerosol: 2 puffs, Inhale, q4hr, PRN, 6.7 g, 0 Refill(s)  Remeron 15 mg oral tablet: 15 mg, 1 tabs, Oral, Once a Day (at bedtime), 30 tabs, 0 Refill(s)  fentaNYL 50 mcg/hr transdermal film, extended release: 1 patches, TD, q72hr, 0 Refill(s)  folic acid 1 mg oral tablet: 1 mg, 1 tabs,  Oral, Daily, 0 Refill(s)  ibuprofen 800 mg oral tablet: 800 mg, 1 tabs, Oral, TID, PRN: for pain, 30 tabs, 0 Refill(s)  oxyCODONE 30 mg oral tablet: 30 mg, 1 tabs, Oral, q4hr, PRN: for pain, 0 Refill(s)  promethazine 25 mg oral tablet: 25 mg, 1 tabs, Oral, q6hr, PRN: as needed for nausea/vomiting, 0 Refill(s)  sodium bicarbonate 650 mg oral tablet: 650 mg, 1 tabs, Oral, TID, 0 Refill(s)  traZODone 150 mg oral tablet: 150 mg, 1 tabs, Oral, Once a Day (at bedtime), 30 tabs, 0 Refill(s).      Past Medical/ Family/ Social History   Medical history: Reviewed as documented in chart.   Surgical history: Reviewed as documented in chart.   Family history: Not significant.   Social history: Reviewed as documented in chart.   Problem list:    Active Problems (4)  Alteration in comfort: pain   Hypertension   Shortness of breath   Sickle cell   .      Physical Examination               Vital Signs   Vital Signs   04/17/2019 17:00 EDT Systolic Blood Pressure 109 mmHg    Diastolic Blood Pressure 66 mmHg    Heart Rate  Monitored 90 bpm    Respiratory Rate 18 br/min    SpO2 97 %   04/17/2019 16:24 EDT Systolic Blood Pressure 105 mmHg    Diastolic Blood Pressure 73 mmHg    Heart Rate Monitored 94 bpm    Respiratory Rate 17 br/min    SpO2 99 %   04/17/2019 15:12 EDT Systolic Blood Pressure 137 mmHg    Diastolic Blood Pressure 84 mmHg    Temperature Oral 38.4 degC  HI    Heart Rate Monitored 97 bpm    Respiratory Rate 20 br/min    SpO2 100 %    Measurements   04/17/2019 15:19 EDT Body Mass Index est meas 23.05 kg/m2    Body Mass Index Measured 23.05 kg/m2   04/17/2019 15:12 EDT Height/Length Measured 160.0 cm    Weight Dosing 59 kg    Basic Oxygen Information   04/17/2019 17:00 EDT SpO2 97 %    Oxygen Therapy Room air   04/17/2019 16:24 EDT SpO2 99 %    Oxygen Therapy Room air   04/17/2019 15:12 EDT SpO2 100 %    Oxygen Therapy Room air    General:  Alert, no acute distress.    Skin:  Warm, dry, pink.    Head:  Normocephalic.   Neck:  Supple, trachea midline.    Eye:  Pupils are equal, round and reactive to light, extraocular movements are intact.    Ears, nose, mouth and throat:  Oral mucosa moist.   Cardiovascular:  Regular rate and rhythm.   Respiratory:  Lungs are clear to auscultation, respirations are non-labored.    Back:  Normal range of motion.   Musculoskeletal:  Normal ROM, no tenderness.    Chest wall   Gastrointestinal:  Soft, Nontender, Non distended, Normal bowel sounds, No organomegaly.    Neurological:  Alert and oriented to person, place, time, and situation, No focal neurological deficit observed, normal sensory observed, normal motor observed.    Lymphatics:  No lymphadenopathy.   Psychiatric:  Cooperative, appropriate mood & affect.       Medical Decision Making   Differential Diagnosis::  Sickle cell crisis, sickle cell disease, pneumonia, urinary tract infection, viral syndrome, aplastic crisis.    Rationale:  33 year old female with sickle cell anemia here with fever and bilateral hip pain.  Plan to obtain labs,  reticulocyte count, COVID swab, UA, chest x-ray, bilateral hip x-ray.    Labs demonstrate a anemia which is somewhat near her baseline.  She has a significant leukocytosis at 17,000.  Procalcitonin added.    UA and XR studies pending    Covid negative. SABRA   Results review:  All Results   04/17/2019 19:03 EDT ED Note-Nursing     04/17/2019 19:00 EDT ED Note-Nursing     04/17/2019 18:07 EDT XR Chest 1 View Portable IMAGE (Modified)    XR Hip Bilateral w/Pelvis IMAGE (Modified)   04/17/2019 17:34 EDT ED Note-Physician Sickle Cell Crisis *ED (In Progress)   04/17/2019 15:19 EDT ED Triage Note     04/17/2019 15:12 EDT ED Triage Note     04/17/2019 19:05 EDT Respiratory Rate 16 br/min     Numeric Rating Pain Scale 7     Pasero Opioid Induced Sedation Scale 1=Awake and alert     Med Response Med Response     ED Medication Response No adverse reaction, Continue to observe for symptoms    04/17/2019 15:05 EDT Consent Forms     04/17/2019 19:03 EDT ED RN Progress Note RT chest port accessed by previous RN, Ileana    04/17/2019 19:02 EDT Systolic Blood Pressure 114 mmHg     Diastolic Blood Pressure 69 mmHg     Temperature Oral 37.2 degC     Heart Rate Monitored 90 bpm     Respiratory Rate 17 br/min     SpO2 98 %     Respiratory Symptoms Cough, Shortness of breath     Shortness of Breath Indicator Shortness of breath at rest, Shortness of breath with ordinary activity     Respirations Unlabored     Respiratory Pattern Regular     Chest Motion Symmetrical     Cough Non-Productive     Patient Airway Status Patent without support     Oxygen Therapy Room air     Skin Color General Usual for ethnicity     Skin Temperature Warm     Skin Moisture General Dry     Hip Bilateral      Musculoskeletal Abnormality: No abnormalities     Musculoskeletal Symptoms: Pain at rest, Pain with movement     Musculoskeletal Range of Motion: Full motion     Level of Consciousness - APVU Alert     Affect/Behavior Appropriate, Calm, Cooperative     Orientation  Assessment Oriented x 4     ED RN Progress Note Pt laying in bed upon assessment. NAD noted. RR even and unlabored. Denies any needs at this time.    04/17/2019 19:00 EDT ED RN Progress Note Assumed care of pt from [place], RN    04/17/2019 18:27 EDT Systolic Blood Pressure 122 mmHg     Diastolic Blood Pressure 81 mmHg     Heart Rate Monitored 89 bpm     Respiratory Rate 17 br/min     SpO2 99 %     Oxygen Therapy Room air    04/17/2019 17:33 EDT UA Color Yellow     UA Appear Clear     UA Glucose Negative     UA Bili Negative     UA Ketones Negative     UA Spec Grav 1.015     UA Blood Negative     UA pH 6.5     Protein  U Trace     UA Urobilinogen 0.2 EU/dL     UA Nitrite Negative     UA Leuk Est Negative     WBC U 0-2 /HPF     RBC U 0-2 /HPF     Sq Epi U Moderate /LPF     Bacteria U Rare     Mucus U Not Seen /LPF     FACES Pain Scale 4 = Hurts little more     Pasero Opioid Induced Sedation Scale 1=Awake and alert     Pasero Opioid Induced Sedation Scale 1=Awake and alert     Med Response Med Response     Med Response Med Response     ED Medication Response No adverse reaction, Symptoms improved     ED Medication Response No adverse reaction, Symptoms improved    04/17/2019 17:32 EDT Preg U POC NEGATIVE    04/17/2019 17:00 EDT Systolic Blood Pressure 109 mmHg     Diastolic Blood Pressure 66 mmHg     Heart Rate Monitored 90 bpm     Respiratory Rate 18 br/min     SpO2 97 %     Oxygen Therapy Room air    04/17/2019 16:51 EDT Estimated Creatinine Clearance 93.64 mL/min    04/17/2019 16:24 EDT Systolic Blood Pressure 105 mmHg     Diastolic Blood Pressure 73 mmHg     Heart Rate Monitored 94 bpm     Respiratory Rate 17 br/min     SpO2 99 %     Oxygen Therapy Room air    04/17/2019 16:23 EDT WBC 17.7 x10e3/mcL  HI     RBC 2.47 x10e6/mcL  LOW     RBC 2.47 x10e6/mcL  LOW     Hgb 6.8 g/dL  CRIT     HCT 79.4 %  CRIT     MCV 83.0 fL     MCH 27.5 pg     MCHC 33.2 g/dL     RDW 81.2 %  HI     Platelet 587 x10e3/mcL  HI     MPV 9.8 fL     NRBC  Absolute Auto 0.02 x10e3/mcL  NA     NRBC Percent Auto 0.1 %  NA     Neutrophil M Pct 61 %     Lymphcyte M Pct 32 %     Monocyte M Pct 4 %     Eosinophil M Pct 2 %     Basophil M Pct 1 %     Neutrophil M # 10.8 x10e3/mcL  HI     Lymphocyte M # 5.7 x10e3/mcL  HI     Monocyte M # 0.7 x10e3/mcL     Eosinophil M # 0.4 x10e3/mcL     Basophil M # 0.2 x10e3/mcL     Ovalocytes Few     PLT estimate Adequate     Poikilocytosis Moderate     RBC morphology Abnormal     Sickle cells Few     Target cells Few     Tear drop Few     Retic Percent 5.5 %  HI     Absolute Retic 0.1314 /mcL  HI     Sodium Lvl 136 mmol/L     Potassium Lvl 3.3 mmol/L  LOW     Chloride 112 mmol/L  HI     CO2 14 mmol/L  LOW     Glucose Random 98 mg/dL     BUN 11 mg/dL     Creatinine  Lvl 0.7 mg/dL     AGAP 10 mmol/L     Osmolality Calc 271 mOsm/kg     Calcium Lvl 8.9 mg/dL     Protein Total 7.3 g/dL     Albumin Lvl 4.5 g/dL     Globulin Calc 2.8 g/dL     AG Ratio Calc 8.38 mmol/L     Alk Phos 100 unit/L     AST 15 unit/L     ALT 9 unit/L     eGFR AA 131 mL/min/1.58m     eGFR Non-AA 113 mL/min/1.87m     Bili Total 0.70 mg/dL     Lactic Acid Lvl 1.2 mmol/L     Procalcitonin 0.27 ng/mL  HI    04/17/2019 15:57 EDT Ordered Isolation Status Initiated    04/17/2019 15:19 EDT Estimated Creatinine Clearance 163.88 mL/min     Body Mass Index est meas 23.05 kg/m2     Body Mass Index Measured 23.05 kg/m2    04/17/2019 15:19 EDT Hx of falling last 3 months ED Fall No     Patient confused of disoriented ED Fall No     Patient intoxicated or sedated ED Fall No     Patient have an impaired gait ED Fall No     Use a mobility assistance device ED Fall No     Patient altered elimination ED Fall No     [facility] ED Fall Score 0     Advanced Directives No     Barriers to Learning None evident    04/17/2019 15:12 EDT Height/Length Measured 160.0 cm     Weight Dosing 59 kg     Systolic Blood Pressure 137 mmHg     Diastolic Blood Pressure 84 mmHg     Temperature Oral 38.4 degC  HI      Heart Rate Monitored 97 bpm     Respiratory Rate 20 br/min     SpO2 100 %     Numeric Rating Pain Scale 8     Cardiovascular Symptoms Shortness of breath     Respiratory Symptoms Cough, Shortness of breath     Shortness of Breath Indicator Shortness of breath sitting straight up, Shortness of breath with ordinary activity, Shortness of breath with exercise, Shortness of breath after reclining at night     Respirations Unlabored     Respiratory Pattern Regular     Chest Motion Symmetrical     Cough Able to clear secretions, Non-Productive     Patient Airway Status Patent without support     Oxygen Therapy Room air     Pregnancy Status Patient denies     Skin Color General Usual for ethnicity     Skin Temperature Warm     Skin Moisture General Dry     Level of Consciousness - APVU Alert     Affect/Behavior Appropriate, Calm, Cooperative     Orientation Assessment Oriented x 4     Last 3 mo, thoughts killing self/others Patient denies     SIRS Possible Source of Infectionn Yes     SIRS Source of Infection Fever >/= 38.0 C or </= 36.0 C     ED Behavioral Activity Rating Scale 4 - Quiet and awake (normal level of activity)     Recent Travel History ID No recent travel     Fever OR Chills ID Yes     Diarrhea ID Yes     Headache ID Yes     TB Symptom Screen ID No symptoms  C. diff Symptom/History ID Neither of the above     Last 14 days COVID-19 ID No     Nausea ID Yes     Fatigue ID Yes     Radiology results:  Rad Results (ST)   XR Hip Bilateral w/Pelvis  ?  04/17/19 18:26:37  XR Hip Bilateral w/Pelvis 2 views 04/17/19    COMPARISON: None    INDICATION: Pain, Joint, Hip, Pelvis or Thigh,    FINDINGS: AP pelvis and frog-leg lateral views of both hips and pelvis  demonstrate no evidence of fracture or dislocation. No definite bone infarcts  seen. No definite AVN by plain film. Bones and soft tissues unremarkable.      IMPRESSION: Normal bilateral hips and bony pelvis  ?  Signed By: GOUGH-MD,  CATHERINE  ?  **************************************************  XR Chest 1 View Portable  ?  04/17/19 18:25:00  XR Chest 1 View Portable    04/17/19    COMPARISON: 03/27/2019    INDICATION: Fever, sickle cell crisis    FINDINGS: Single view of the chest demonstrates cardiomegaly. The lungs are  otherwise clear. No evidence of pneumonia. Infuse-a-Port catheter in place in  the right IJ. No pulmonary nodule or mass. No evidence of CHF. No pleural  effusion. Bones and soft tissues remarkable for increased density consistent  with a history of sickle cell disease.      IMPRESSION: Cardiomegaly but otherwise no evidence of pneumonia or CHF.  ?  Signed By: GOUGH-MD, CATHERINE  .      Reexamination/ Reevaluation   Vital signs   Basic Oxygen Information   04/17/2019 17:00 EDT SpO2 97 %    Oxygen Therapy Room air   04/17/2019 16:24 EDT SpO2 99 %    Oxygen Therapy Room air   04/17/2019 15:12 EDT SpO2 100 %    Oxygen Therapy Room air      Notes: Procal mildly elevated. This in combination with leukocytosis may suggest bacterial infection. CXR clear. UA neg. She has no abdominal pain nor tenderness on exam. I will hold antibiotic therapy at this time as she is clinically stable. Plan for admission to hospitalist. .      Impression and Plan   Diagnosis   Sickle cell pain crisis (ICD10-CM D57.00, Discharge, Medical)   Fever (ICD10-CM R50.9, Discharge, Medical)   Plan   Condition: Stable.    Disposition: Admit to Inpatient Unit.    Counseled: Patient.

## 2019-04-17 NOTE — Nursing Note (Signed)
 Adult Patient History Form-Text       Adult Patient History Entered On:  04/17/2019 20:57 EDT    Performed On:  04/17/2019 20:47 EDT by [place] , RN, Debbi D               General Info   Patient Identified :   Verbal   Information Given By :   Self   Preferred Mode of Communication :   Verbal   Pregnancy Status :   Patient denies   Has the patient received chemotherapy or immunotherapy (cytotoxic)  in the last 48-72 hours? :   No   Is the patient currently (2-3 days) receiving radiation treatment? :   No   [place] , RN, Debbi D - 04/17/2019 20:47 EDT   Allergies   (As Of: 04/17/2019 20:57:16 EDT)   Allergies (Active)   Bactrim  Estimated Onset Date:   Unspecified ; Reactions:   Itching ; Created By:   SCOT RN, TINNIE NOVAK; Reaction Status:   Active ; Category:   Drug ; Substance:   Bactrim ; Type:   Allergy ; Severity:   Moderate ; Updated By:   SCOT OBIE TINNIE NOVAK; Reviewed Date:   04/17/2019 20:49 EDT      contrast media (iodine-based)  Estimated Onset Date:   Unspecified ; Created ByBETHA EASTER, RN, JENNETTE; Reaction Status:   Active ; Category:   Drug ; Substance:   contrast media (iodine-based) ; Type:   Allergy ; Severity:   Unknown ; Updated By:   EASTER OBIE COUNTS; Reviewed Date:   04/17/2019 20:49 EDT      fentaNYL  Estimated Onset Date:   Unspecified ; Created By:   EASTER RN, JENNETTE; Reaction Status:   Active ; Category:   Drug ; Substance:   fentaNYL ; Type:   Allergy ; Severity:   Unknown ; Updated By:   EASTER OBIE COUNTS; Reviewed Date:   04/17/2019 20:49 EDT      morphine  Estimated Onset Date:   Unspecified ; Created By:   EASTER RN, JENNETTE; Reaction Status:   Active ; Category:   Drug ; Substance:   morphine ; Type:   Allergy ; Severity:   Unknown ; Updated By:   EASTER OBIE COUNTS; Reviewed Date:   04/17/2019 20:49 EDT      Rocephin  Estimated Onset Date:   Unspecified ; Created By:   EASTER RN, JENNETTE; Reaction Status:   Active ; Category:   Drug ; Substance:    Rocephin ; Type:   Allergy ; Severity:   Unknown ; Updated By:   EASTER OBIE COUNTS; Reviewed Date:   04/17/2019 20:49 EDT        Medication History   Medication List   (As Of: 04/17/2019 20:57:16 EDT)   Normal Order    Dextrose 5% with 0.45% NaCl intravenous solution 1,000 mL  :   Dextrose 5% with 0.45% NaCl intravenous solution 1,000 mL ; Status:   Ordered ; Ordered As Mnemonic:   Dextrose 5% with 0.45% NaCl 1,000 mL ; Simple Display Line:   200 mL/hr, IV ; Ordering Provider:   LORINE RUSH D; Catalog Code:   Dextrose 5% with 0.45% NaCl ; Order Dt/Tm:   04/17/2019 19:42:26 EDT          citalopram 20 mg Tab  :   citalopram 20 mg Tab ; Status:   Ordered ; Ordered As Mnemonic:   CeleXA ; Simple Display Line:   40  mg, 2 tabs, Oral, Daily ; Ordering Provider:   LORINE NORLEEN BIRCH; Catalog Code:   citalopram ; Order Dt/Tm:   04/17/2019 20:14:57 EDT ; Comment:   ---  At home, patient was taking medication with the following details:  Comments: SOUND ALIKE / LOOK ALIKE - VERIFY DRUG    ---          folic acid 1 mg Tab  :   folic acid 1 mg Tab ; Status:   Ordered ; Ordered As Mnemonic:   folic acid ; Simple Display Line:   1 mg, 1 tabs, Oral, Daily ; Ordering Provider:   LORINE NORLEEN BIRCH; Catalog Code:   folic acid ; Order Dt/Tm:   04/17/2019 20:15:34 EDT          clonazePAM 0.5 mg Tab  :   clonazePAM 0.5 mg Tab ; Status:   Ordered ; Ordered As Mnemonic:   KlonoPIN ; Simple Display Line:   0.5 mg, 1 tabs, Oral, Once a Day (at bedtime) ; Ordering Provider:   LORINE NORLEEN BIRCH; Catalog Code:   clonazePAM ; Order Dt/Tm:   04/17/2019 20:14:59 EDT ; Comment:   ---  At home, patient was taking medication with the following details:  Comments: SOUND ALIKE / LOOK ALIKE - VERIFY DRUG  SOUND ALIKE / LOOK ALIKE - VERIFY DRUG    ---          cyclobenzaprine 10 mg Tab  :   cyclobenzaprine 10 mg Tab ; Status:   Ordered ; Ordered As Mnemonic:   cyclobenzaprine ; Simple Display Line:   5 mg, 0.5 tabs, Oral, TID ;  Ordering Provider:   LORINE NORLEEN BIRCH; Catalog Code:   cyclobenzaprine ; Order Dt/Tm:   04/17/2019 20:15:02 EDT ; Comment:   ---  At home, patient was taking medication with the following details:  Comments:  THIS MEDICATION IS ASSOCIATED   WITH   AN INCREASED RISK OF FALLS.    ---          ibuprofen 800 mg Tab  :   ibuprofen 800 mg Tab ; Status:   Ordered ; Ordered As Mnemonic:   ibuprofen ; Simple Display Line:   800 mg, 1 tabs, Oral, TID ; Ordering Provider:   LORINE NORLEEN BIRCH; Catalog Code:   ibuprofen ; Order Dt/Tm:   04/17/2019 19:42:26 EDT ; Comment:   DO NOT CRUSH          mirtazapine 15 mg Tab  :   mirtazapine 15 mg Tab ; Status:   Ordered ; Ordered As Mnemonic:   Remeron ; Simple Display Line:   15 mg, 1 tabs, Oral, Once a Day (at bedtime) ; Ordering Provider:   LORINE NORLEEN BIRCH; Catalog Code:   mirtazapine ; Order Dt/Tm:   04/17/2019 20:15:39 EDT          fentaNYL  :   fentaNYL ; Status:   Voided ; Ordered As Mnemonic:   fentaNYL 50 mcg/hr transdermal film, extended release ; Simple Display Line:   1 patches, TD, q72hr ; Ordering Provider:   LORINE NORLEEN BIRCH; Catalog Code:   fentaNYL ; Order Dt/Tm:   04/17/2019 20:15:32 EDT          fentaNYL 50 mcg/hr TD Film, ER  :   fentaNYL 50 mcg/hr TD Film, ER ; Status:   Ordered ; Ordered As Mnemonic:   fentaNYL 50 mcg/hr transdermal film, extended release ; Simple Display Line:  1 patches, TD, q72hr ; Ordering Provider:   LORINE NORLEEN BIRCH; Catalog Code:   fentaNYL ; Order Dt/Tm:   04/17/2019 20:20:10 EDT          promethazine 25 mg Tab  :   promethazine 25 mg Tab ; Status:   Ordered ; Ordered As Mnemonic:   promethazine ; Simple Display Line:   25 mg, 1 tabs, Oral, q6hr, PRN: nausea/vomiting ; Ordering Provider:   LORINE NORLEEN BIRCH; Catalog Code:   promethazine ; Order Dt/Tm:   04/17/2019 20:16:05 EDT          sodium bicarbonate 650 mg Tab  :   sodium bicarbonate 650 mg Tab ; Status:   Ordered ; Ordered As Mnemonic:   sodium  bicarbonate 650 mg oral tablet ; Simple Display Line:   325 mg, 0.5 tabs, Oral, TID, PRN: indigestion ; Ordering Provider:   LORINE NORLEEN BIRCH; Catalog Code:   sodium bicarbonate ; Order Dt/Tm:   04/17/2019 20:16:09 EDT          diphenhydrAMINE 25 mg Cap  :   diphenhydrAMINE 25 mg Cap ; Status:   Ordered ; Ordered As Mnemonic:   Benadryl ; Simple Display Line:   50 mg, 2 caps, Oral, BID, PRN: anxiety ; Ordering Provider:   LORINE NORLEEN BIRCH; Catalog Code:   diphenhydrAMINE ; Order Dt/Tm:   04/17/2019 20:15:06 EDT ; Comment:   ---  At home, patient was taking medication with the following details:  PRN Instructions: prn  ---          oxyCODONE 15 mg Tab  :   oxyCODONE 15 mg Tab ; Status:   Ordered ; Ordered As Mnemonic:   oxyCODONE ; Simple Display Line:   30 mg, 2 tabs, Oral, q4hr, PRN: mild pain (1-3) ; Ordering Provider:   LORINE NORLEEN D; Catalog Code:   oxyCODONE ; Order Dt/Tm:   04/17/2019 20:15:46 EDT          albuterol CFC free 90 mcg/inh Aerosol 8.5 gm [chg per puff]  :   albuterol CFC free 90 mcg/inh Aerosol 8.5 gm [chg per puff] ; Status:   Ordered ; Ordered As Mnemonic:   Proventil HFA 90 mcg/inh inhalation aerosol ; Simple Display Line:   180 mcg, 2 inh, Inhale, q4hr, PRN: shortness of breath or wheezing ; Ordering Provider:   LORINE NORLEEN BIRCH; Catalog Code:   albuterol ; Order Dt/Tm:   04/17/2019 20:14:40 EDT          acetaminophen 325 mg Tab  :   acetaminophen 325 mg Tab ; Status:   Ordered ; Ordered As Mnemonic:   acetaminophen ; Simple Display Line:   650 mg, 2 tabs, Oral, q4hr, PRN: mild pain (1-3) ; Ordering Provider:   LORINE NORLEEN BIRCH; Catalog Code:   acetaminophen ; Order Dt/Tm:   04/17/2019 19:42:32 EDT          bisacodyl 10 mg Rectal Supp  :   bisacodyl 10 mg Rectal Supp ; Status:   Ordered ; Ordered As Mnemonic:   bisacodyl ; Simple Display Line:   10 mg, 1 supp, PR, Once a Day (at bedtime), PRN: constipation ; Ordering Provider:   LORINE NORLEEN BIRCH; Catalog Code:    bisacodyl ; Order Dt/Tm:   04/17/2019 19:42:33 EDT          docusate-senna 50 mg-8.6 mg Tab  :   docusate-senna 50 mg-8.6 mg Tab ; Status:  Ordered ; Ordered As Mnemonic:   docusate-senna 50 mg-8.6 mg oral tablet ; Simple Display Line:   1 tabs, Oral, BID, PRN: constipation ; Ordering Provider:   LORINE NORLEEN BIRCH; Catalog Code:   docusate-senna ; Order Dt/Tm:   04/17/2019 19:42:33 EDT ; Comment:   Use first as oral treatment PRN for constipation          HYDROmorphone 2 mg/mL Inj Soln 1 mL  :   HYDROmorphone 2 mg/mL Inj Soln 1 mL ; Status:   Ordered ; Ordered As Mnemonic:   Dilaudid ; Simple Display Line:   2 mg, 1 mL, IV Push, q2hr, PRN: severe pain (8-10) ; Ordering Provider:   LORINE NORLEEN BIRCH; Catalog Code:   HYDROmorphone ; Order Dt/Tm:   04/17/2019 19:42:26 EDT ; Comment:   THIS MEDICATION IS ASSOCIATED   WITH   AN INCREASED RISK OF FALLS.          magnesium hydroxide 8% Oral Susp 30 mL  :   magnesium hydroxide 8% Oral Susp 30 mL ; Status:   Ordered ; Ordered As Mnemonic:   magnesium hydroxide ; Simple Display Line:   30 mL, Oral, q6hr, PRN: constipation ; Ordering Provider:   LORINE NORLEEN BIRCH; Catalog Code:   magnesium hydroxide ; Order Dt/Tm:   04/17/2019 19:42:33 EDT ; Comment:   May use MOM  if Senokot-S does not relieve constipation          melatonin 5 mg Tab  :   melatonin 5 mg Tab ; Status:   Ordered ; Ordered As Mnemonic:   melatonin ; Simple Display Line:   5 mg, 1 tabs, Oral, At Bedtime (Once a Day), PRN: insomnia ; Ordering Provider:   LORINE NORLEEN BIRCH; Catalog Code:   melatonin ; Order Dt/Tm:   04/17/2019 19:42:26 EDT          ondansetron 2 mg/mL Inj Soln 2 mL  :   ondansetron 2 mg/mL Inj Soln 2 mL ; Status:   Ordered ; Ordered As Mnemonic:   ondansetron ; Simple Display Line:   4 mg, 2 mL, IV Push, q6hr, PRN: nausea/vomiting ; Ordering Provider:   LORINE NORLEEN BIRCH; Catalog Code:   ondansetron ; Order Dt/Tm:   04/17/2019 19:42:32 EDT          polyethylene glycol 3350  Oral Powder for Recon  :   polyethylene glycol 3350 Oral Powder for Recon ; Status:   Ordered ; Ordered As Mnemonic:   polyethylene glycol 3350 ; Simple Display Line:   17 g, 1 packets, Oral, BID, PRN: constipation ; Ordering Provider:   LORINE NORLEEN D; Catalog Code:   polyethylene glycol 3350 ; Order Dt/Tm:   04/17/2019 19:42:26 EDT ; Comment:    DISSOLVE POWDER IN 8 OUNCES OF   WATER, JUICE, COLA OR TEA.          promethazine 25 mg/20 mL Oral Syrup  :   promethazine 25 mg/20 mL Oral Syrup ; Status:   Discontinued ; Ordered As Mnemonic:   promethazine ; Simple Display Line:   6.25 mg, 5 mL, Oral, q6hr, PRN: nausea/vomiting ; Ordering Provider:   LORINE NORLEEN BIRCH; Catalog Code:   promethazine ; Order Dt/Tm:   04/17/2019 19:42:32 EDT ; Comment:   if nausea unrelieved by ondansetron (zofran) give promethazine (phenergan)          promethazine 12.5 mg Rectal Supp  :   promethazine 12.5 mg Rectal Supp ;  Status:   Ordered ; Ordered As Mnemonic:   promethazine ; Simple Display Line:   6.25 mg, 0.5 supp, PR, q6hr, PRN: nausea/vomiting ; Ordering Provider:   LORINE NORLEEN BIRCH; Catalog Code:   promethazine ; Order Dt/Tm:   04/17/2019 19:42:33 EDT ; Comment:   if nausea unrelieved by ondansetron (zofran) give promethazine (phenergan)          diphenhydrAMINE 50 mg/mL Inj Soln 1 mL  :   diphenhydrAMINE 50 mg/mL Inj Soln 1 mL ; Status:   Completed ; Ordered As Mnemonic:   Benadryl ; Simple Display Line:   25 mg, 0.5 mL, IV Push, Once ; Ordering Provider:   WILNETTE, MD, DEBBY BROCKS; Catalog Code:   diphenhydrAMINE ; Order Dt/Tm:   04/17/2019 19:39:10 EDT ; Comment:    THIS MEDICATION IS ASSOCIATED   WITH   AN INCREASED RISK OF FALLS.          HYDROmorphone 2 mg/mL Inj Soln 1 mL  :   HYDROmorphone 2 mg/mL Inj Soln 1 mL ; Status:   Completed ; Ordered As Mnemonic:   Dilaudid ; Simple Display Line:   1 mg, 0.5 mL, IV Push, Once ; Ordering Provider:   WILNETTE, MD, DEBBY BROCKS; Catalog Code:   HYDROmorphone ; Order  Dt/Tm:   04/17/2019 17:50:21 EDT          HYDROmorphone 2 mg/mL Inj Soln 1 mL  :   HYDROmorphone 2 mg/mL Inj Soln 1 mL ; Status:   Completed ; Ordered As Mnemonic:   Dilaudid ; Simple Display Line:   1 mg, 0.5 mL, IV Push, Once ; Ordering Provider:   WILNETTE, MD, DEBBY BROCKS; Catalog Code:   HYDROmorphone ; Order Dt/Tm:   04/17/2019 15:41:41 EDT          promethazine 25 mg/mL Inj Soln 1 mL  :   promethazine 25 mg/mL Inj Soln 1 mL ; Status:   Completed ; Ordered As Mnemonic:   promethazine IV - High Alert ; Simple Display Line:   12.5 mg, 0.5 mL, IV Push, Once ; Ordering Provider:   WILNETTE, MD, DEBBY BROCKS; Catalog Code:   promethazine ; Order Dt/Tm:   04/17/2019 15:41:41 EDT          diphenhydrAMINE 50 mg/mL Inj Soln 1 mL  :   diphenhydrAMINE 50 mg/mL Inj Soln 1 mL ; Status:   Completed ; Ordered As Mnemonic:   Benadryl ; Simple Display Line:   25 mg, 0.5 mL, IV Push, Once ; Ordering Provider:   WILNETTE, MD, DEBBY BROCKS; Catalog Code:   diphenhydrAMINE ; Order Dt/Tm:   04/17/2019 15:27:46 EDT          acetaminophen 500 mg Tab  :   acetaminophen 500 mg Tab ; Status:   Completed ; Ordered As Mnemonic:   Tylenol ; Simple Display Line:   1,000 mg, 2 tabs, Oral, Once ; Ordering Provider:   WILNETTE, MD, DEBBY BROCKS; Catalog Code:   acetaminophen ; Order Dt/Tm:   04/17/2019 15:26:51 EDT ; Comment:   MAX DAILY DOSE OF ACETAMINOPHEN = 3000 MG          Sodium Chloride 0.9% intravenous solution Bolus  :   Sodium Chloride 0.9% intravenous solution Bolus ; Status:   Completed ; Ordered As Mnemonic:   Sodium Chloride 0.9% bolus ; Simple Display Line:   1,000 mL, 2000 mL/hr, IV Piggyback, Once ; Ordering Provider:   WILNETTE, MD, DEBBY BROCKS; Catalog Code:   Sodium  Chloride 0.9% ; Order Dt/Tm:   04/17/2019 15:26:51 EDT            Home Meds    cyclobenzaprine  :   cyclobenzaprine ; Status:   Documented ; Ordered As Mnemonic:   cyclobenzaprine 5 mg oral tablet ; Simple Display Line:   5 mg, 1 tabs, Oral, TID, for 14 days, 42 tabs, 0 Refill(s) ;  Catalog Code:   cyclobenzaprine ; Order Dt/Tm:   04/17/2019 19:33:49 EDT ; Comment:    THIS MEDICATION IS ASSOCIATED   WITH   AN INCREASED RISK OF FALLS.          sodium bicarbonate  :   sodium bicarbonate ; Status:   Documented ; Ordered As Mnemonic:   sodium bicarbonate 325 mg oral tablet ; Simple Display Line:   1 tabs, Oral, TID, PRN: for indigestion, 30 tabs, 0 Refill(s) ; Catalog Code:   sodium bicarbonate ; Order Dt/Tm:   04/17/2019 19:31:40 EDT          albuterol  :   albuterol ; Status:   Documented ; Ordered As Mnemonic:   Proventil HFA 90 mcg/inh inhalation aerosol ; Simple Display Line:   2 puffs, Inhale, q4hr, PRN, 6.7 g, 0 Refill(s) ; Catalog Code:   albuterol ; Order Dt/Tm:   03/28/2019 09:31:25 EDT          bisacodyl  :   bisacodyl ; Status:   Documented ; Ordered As Mnemonic:   Dulcolax Laxative 5 mg oral delayed release tablet ; Simple Display Line:   5 mg, 1 tabs, Oral, Daily, PRN, 0 Refill(s) ; Catalog Code:   bisacodyl ; Order Dt/Tm:   03/28/2019 09:31:31 EDT ; Comment:   DO NOT CRUSH          citalopram  :   citalopram ; Status:   Documented ; Ordered As Mnemonic:   CeleXA 40 mg oral tablet ; Simple Display Line:   40 mg, 1 tabs, Oral, Daily, 30 tabs, 0 Refill(s) ; Catalog Code:   citalopram ; Order Dt/Tm:   03/28/2019 09:26:41 EDT ; Comment:   SOUND ALIKE / LOOK ALIKE - VERIFY DRUG          clonazePAM  :   clonazePAM ; Status:   Documented ; Ordered As Mnemonic:   KlonoPIN 0.5 mg oral tablet ; Simple Display Line:   0.5 mg, 1 tabs, Oral, Once a Day (at bedtime), 0 Refill(s) ; Catalog Code:   clonazePAM ; Order Dt/Tm:   03/28/2019 09:26:46 EDT ; Comment:   SOUND ALIKE / LOOK ALIKE - VERIFY DRUG  SOUND ALIKE / LOOK ALIKE - VERIFY DRUG          diphenhydrAMINE  :   diphenhydrAMINE ; Status:   Documented ; Ordered As Mnemonic:   Benadryl 25 mg oral tablet ; Simple Display Line:   50 mg, 2 tabs, Oral, PRN: prn, 0 Refill(s) ; Catalog Code:   diphenhydrAMINE ; Order Dt/Tm:   03/28/2019 09:31:07 EDT           docusate  :   docusate ; Status:   Documented ; Ordered As Mnemonic:   Colace 100 mg oral capsule ; Simple Display Line:   100 mg, 1 caps, Oral, BID, PRN: as needed for constipation, 0 Refill(s) ; Catalog Code:   docusate ; Order Dt/Tm:   03/28/2019 09:31:40 EDT          fentaNYL  :   fentaNYL ; Status:   Documented ;  Ordered As Mnemonic:   fentaNYL 50 mcg/hr transdermal film, extended release ; Simple Display Line:   1 patches, TD, q72hr, 0 Refill(s) ; Catalog Code:   fentaNYL ; Order Dt/Tm:   03/28/2019 09:27:02 EDT          folic acid  :   folic acid ; Status:   Documented ; Ordered As Mnemonic:   folic acid 1 mg oral tablet ; Simple Display Line:   1 mg, 1 tabs, Oral, Daily, 0 Refill(s) ; Catalog Code:   folic acid ; Order Dt/Tm:   03/28/2019 90:71:74 EDT          ibuprofen  :   ibuprofen ; Status:   Documented ; Ordered As Mnemonic:   ibuprofen 800 mg oral tablet ; Simple Display Line:   800 mg, 1 tabs, Oral, TID, PRN: for pain, 30 tabs, 0 Refill(s) ; Catalog Code:   ibuprofen ; Order Dt/Tm:   03/28/2019 09:28:46 EDT          mirtazapine  :   mirtazapine ; Status:   Documented ; Ordered As Mnemonic:   Remeron 15 mg oral tablet ; Simple Display Line:   15 mg, 1 tabs, Oral, Once a Day (at bedtime), 30 tabs, 0 Refill(s) ; Catalog Code:   mirtazapine ; Order Dt/Tm:   03/28/2019 90:71:47 EDT          oxyCODONE  :   oxyCODONE ; Status:   Documented ; Ordered As Mnemonic:   oxyCODONE 30 mg oral tablet ; Simple Display Line:   30 mg, 1 tabs, Oral, q4hr, PRN: for pain, 0 Refill(s) ; Catalog Code:   oxyCODONE ; Order Dt/Tm:   03/28/2019 09:29:01 EDT          promethazine  :   promethazine ; Status:   Documented ; Ordered As Mnemonic:   promethazine 25 mg oral tablet ; Simple Display Line:   25 mg, 1 tabs, Oral, q6hr, PRN: as needed for nausea/vomiting, 0 Refill(s) ; Catalog Code:   promethazine ; Order Dt/Tm:   03/28/2019 09:32:47 EDT          sodium bicarbonate  :   sodium bicarbonate ; Status:   Discontinued ; Ordered As  Mnemonic:   sodium bicarbonate 650 mg oral tablet ; Simple Display Line:   650 mg, 1 tabs, Oral, TID, 0 Refill(s) ; Catalog Code:   sodium bicarbonate ; Order Dt/Tm:   03/28/2019 09:29:10 EDT          traZODone  :   traZODone ; Status:   Discontinued ; Ordered As Mnemonic:   traZODone 150 mg oral tablet ; Simple Display Line:   150 mg, 1 tabs, Oral, Once a Day (at bedtime), 30 tabs, 0 Refill(s) ; Catalog Code:   traZODone ; Order Dt/Tm:   03/28/2019 90:70:72 EDT ; Comment:   DO NOT CRUSH            Problem History   (As Of: 04/17/2019 20:57:16 EDT)   Problems(Active)    Alteration in comfort: pain (SNOMED CT  :65759985 )  Name of Problem:   Alteration in comfort: pain ; Recorder:   SYSTEM,  SYSTEM; Confirmation:   Confirmed ; Classification:   Interdisciplinary ; Code:   [id number] ; Last Updated:   03/28/2019 2:23 EDT ; Life Cycle Date:   03/28/2019 ; Life Cycle Status:   Active ; Vocabulary:   SNOMED CT   ; Comments:        03/28/2019 2:23 -  SYSTEM,  SYSTEM  Problem added automatically by system based on initiation of Alteration in Comfort Plan of Care      Hypertension (IMO  :13508 )  Name of Problem:   Hypertension ; Recorder:   CHARLIES, RN, JENNETTE; Confirmation:   Confirmed ; Classification:   Medical ; Code:   [phone] ; Contributor System:   PowerChart ; Last Updated:   11/08/2015 5:36 EST ; Life Cycle Date:   11/08/2015 ; Life Cycle Status:   Active ; Vocabulary:   IMO        Shortness of breath (IMO  :72576 )  Name of Problem:   Shortness of breath ; Recorder:   SYSTEM,  SYSTEM; Confirmation:   Confirmed ; Classification:   Patient Stated ; Code:   72576 ; Last Updated:   04/17/2019 15:58 EDT ; Life Cycle Date:   04/17/2019 ; Life Cycle Status:   Active ; Vocabulary:   IMO        Sickle cell (SNOMED CT  :16827988 )  Name of Problem:   Sickle cell ; Recorder:   ALBAYALDE, RN, JENNETTE; Confirmation:   Confirmed ; Classification:   Medical ; Code:   [id number] ; Contributor System:   PowerChart ; Last Updated:   11/08/2015  5:36 EST ; Life Cycle Date:   11/08/2015 ; Life Cycle Status:   Active ; Vocabulary:   SNOMED CT          Diagnoses(Active)    Fever  Date:   04/17/2019 ; Diagnosis Type:   Discharge ; Confirmation:   Confirmed ; Clinical Dx:   Fever ; Classification:   Medical ; Clinical Service:   Non-Specified ; Code:   ICD-10-CM ; Probability:   0 ; Diagnosis Code:   R50.9      Fever  Date:   04/17/2019 ; Diagnosis Type:   Reason For Visit ; Confirmation:   Complaint of ; Clinical Dx:   Fever ; Classification:   Medical ; Clinical Service:   Non-Specified ; Code:   PNED ; Probability:   0 ; Diagnosis Code:   [id number]      Sickle cell pain crisis  Date:   04/17/2019 ; Diagnosis Type:   Discharge ; Confirmation:   Confirmed ; Clinical Dx:   Sickle cell pain crisis ; Classification:   Medical ; Clinical Service:   Non-Specified ; Code:   ICD-10-CM ; Probability:   0 ; Diagnosis Code:   D57.00        Procedure History        -    Procedure History   (As Of: 04/17/2019 20:57:16 EDT)     Anesthesia Minutes:   0 ; Procedure Name:   Port ; Procedure Minutes:   0 ; Last Reviewed Dt/Tm:   04/17/2019 20:51:10 EDT            Anesthesia Minutes:   0 ; Procedure Name:   thoracotomy ; Procedure Minutes:   0 ; Last Reviewed Dt/Tm:   04/17/2019 20:51:10 EDT            Anesthesia Minutes:   0 ; Procedure Name:   hernia repair ; Procedure Minutes:   0 ; Last Reviewed Dt/Tm:   04/17/2019 20:51:10 EDT            Anesthesia Minutes:   0 ; Procedure Name:   splenectomy ; Procedure Minutes:   0 ; Last Reviewed Dt/Tm:   04/17/2019 20:51:10 EDT  Anesthesia Minutes:   0 ; Procedure Name:   Cholecystectomy; ; Procedure Minutes:   0 ; Last Reviewed Dt/Tm:   04/17/2019 20:51:10 EDT            Immunizations   Last Tetanus :   Less than 5 years   [place] , RN, Debbi D - 04/17/2019 20:47 EDT   ID Risk Screen Symptoms   Recent Travel History :   No recent travel   Close Contact with COVID-19 ID :   No   Last 14 days COVID-19 ID :    No   TB Symptom Screen :   Cough AND Fever OR Chills   [place] , RN, Debbi D - 04/17/2019 20:47 EDT   C. diff Symptom/History ID :   Neither of the above   [place] , RN, Debbi D - 04/17/2019 20:57 EDT     Patient Pregnant :   None of the above   MRSA/VRE Screening :   None of these apply   CRE Screening :   Overnight stay in a healthcare facility outside the [place] (within the last 1 year)   [place] , RN, Debbi D - 04/17/2019 20:47 EDT   ID C. diff Screen     [place] , RN, Xyjboj D - 04/17/2019 20:57 EDT     ID TB Screen   Hemoptysis (Blood in Sputum) :   No   Night Sweats :   No   Weight Loss Greater Than 10 Pounds :   No   Hx of TB Now or at Any Time in the Past :   No   [place] , RN, Debbi D - 04/17/2019 20:47 EDT   Bloodless Medicine   Is Blood Transfusion Acceptable to Patient :   Yes       [place] , RN, Debbi D - 04/17/2019 22:54 EDT     Nutrition   MST Does Your Current Diet Include :   None   MST Have You Recently Lost Weight Without Trying? :   No   MST Weight Loss Score :   0    MST Have You Been Eating Poorly? :   No   MST Appetite Score :   0    MST Score :   0    MST Interpretation :   Not at risk   [place] , RN, Debbi D - 04/17/2019 20:47 EDT   Functional   Sensory Deficits :   None   ADLs Prior to Admission :   Independent   [place] , RN, Debbi BIRCH - 04/17/2019 20:47 EDT   Social History   Social History   (As Of: 04/17/2019 20:57:16 EDT)   Tobacco:        Former smoker   (Last Updated: 11/08/2015 05:40:04 EST by CHARLIES, RN, JENNETTE)          Alcohol:        Denies   (Last Updated: 03/27/2019 20:00:39 EDT by VENUS, RN, GAIL E)          Substance Use:        Opioid Complex, Current, Prescription medications, Marijuana   (Last Updated: 03/27/2019 20:04:05 EDT by VENUS, RN, ALISA BRAVO)            Spiritual   Faith/Denomination :   [place]le[place]   Do you have any religious/spiritual/cultural beliefs that could impact the way your care is provided? :   No   [place] , RN, Debbi D - 04/17/2019  20:47 EDT   Harm Screen   Injuries/Abuse/Neglect in Household :  Denies   Feels Unsafe at Home :   Yes   Last 3 mo, thoughts killing self/others :   Patient denies   [place] , RN, Debbi BIRCH - 04/17/2019 20:47 EDT   Advance Directive   Advance Directive :   No   Patient Wishes to Receive Further Information on Advance Directives :   No   [place] , RN, Debbi BIRCH - 04/17/2019 20:47 EDT   Education   Written Language :   Isadora   Primary Language :   Isadora   [place] , RN, Debbi D - 04/17/2019 20:47 EDT   Caregiver/Advocate Language   Patient :   Verbal explanation, Demonstration   [place] , RN, Debbi D - 04/17/2019 20:47 EDT   Barriers to Learning :   None evident   [place] , RN, Debbi D - 04/17/2019 20:47 EDT   Preventative Measures Information   Unit/Room Orientation :   [place] understanding   Environmental Safety :   [place] understanding   Hand Washing :   Verbalizes understanding   Infection Prevention :   Verbalizes understanding   DVT Prophylaxis :   Verbalizes understanding   Isolation Precaution :   Verbalizes understanding   [place] , RN, Debbi D - 04/17/2019 20:47 EDT   DC Needs   Home Caregiver Name/Relationship :   Patient   [place] , RN, Debbi D - 04/17/2019 20:47 EDT   Valuables and Belongings   Valuables and Belongings   At Bedside :   Cherylann Hipp phone   [place] , RN, Debbi D - 04/17/2019 20:47 EDT   Admission Complete   Admission Complete :   Yes-unable to obtain further information   [place] , RN, Debbi D - 04/17/2019 20:47 EDT

## 2019-04-17 NOTE — ED Notes (Signed)
ED Patient Education Note     Patient Education Materials Follows:

## 2019-04-17 NOTE — ED Notes (Signed)
ED Note-Nursing       ED RN Reassessment Entered On:  04/17/2019 19:27 EDT    Performed On:  04/17/2019 19:03 EDT by Aldine Contes, RN, Samuel Germany               ED RN Reassessment   ED RN Progress Note :   RT chest port accessed by previous RN, Georgian Co, RN, [place] A - 04/17/2019 19:27 EDT

## 2019-04-18 LAB — BASIC METABOLIC PANEL
Anion Gap: 12 mmol/L (ref 2–17)
Anion Gap: 10 mmol/L (ref 2–17)
BUN: 11 mg/dL (ref 6–20)
BUN: 9 mg/dL (ref 6–20)
CO2: 14 mmol/L — ABNORMAL LOW (ref 22–29)
CO2: 14 mmol/L — ABNORMAL LOW (ref 22–29)
Calcium: 8.1 mg/dL — ABNORMAL LOW (ref 8.6–10.0)
Calcium: 8.4 mg/dL — ABNORMAL LOW (ref 8.6–10.0)
Chloride: 112 mmol/L — ABNORMAL HIGH (ref 98–107)
Chloride: 113 mmol/L — ABNORMAL HIGH (ref 98–107)
Creatinine: 0.6 mg/dL (ref 0.5–0.9)
Creatinine: 0.6 mg/dL (ref 0.5–0.9)
GFR African American: 138 mL/min/{1.73_m2} (ref >=90)
GFR African American: 138 mL/min/{1.73_m2} (ref >=90)
GFR Non-African American: 119 mL/min/{1.73_m2} (ref >=90)
GFR Non-African American: 119 mL/min/{1.73_m2} (ref >=90)
Glucose: 134 mg/dL — ABNORMAL HIGH (ref 70–99)
Glucose: 141 mg/dL — ABNORMAL HIGH (ref 70–99)
OSMOLALITY CALCULATED: 277 mOsm/kg (ref 270–287)
OSMOLALITY CALCULATED: 275 mOsm/kg (ref 270–287)
Potassium: 3.1 mmol/L — ABNORMAL LOW (ref 3.5–5.3)
Potassium: 3.5 mmol/L (ref 3.5–5.3)
Sodium: 138 mmol/L (ref 135–145)
Sodium: 137 mmol/L (ref 135–145)

## 2019-04-18 LAB — CBC WITH AUTO DIFFERENTIAL
Absolute Baso #: 0.1 10*3/uL (ref 0.0–0.2)
Absolute Eos #: 0.7 10*3/uL — ABNORMAL HIGH (ref 0.0–0.5)
Absolute Lymph #: 7.8 10*3/uL — ABNORMAL HIGH (ref 1.0–3.2)
Absolute Mono #: 1.6 10*3/uL — ABNORMAL HIGH (ref 0.3–1.0)
Basophils %: 0.5 % (ref 0.0–2.0)
Eosinophils %: 4.2 % (ref 0.0–7.0)
Hematocrit: 22.2 % — ABNORMAL LOW (ref 34.0–47.0)
Hemoglobin: 7.2 g/dL — ABNORMAL LOW (ref 11.5–15.7)
Immature Grans (Abs): 0.1 10*3/uL
Immature Granulocytes: 0.3 %
Lymphocytes: 44.7 % (ref 15.0–45.0)
MCH: 27.5 pg (ref 27.0–34.5)
MCHC: 32.4 g/dL (ref 32.0–36.0)
MCV: 84.7 fL (ref 81.0–99.0)
MPV: 9.9 fL (ref 7.2–13.2)
Monocytes: 8.9 % (ref 4.0–12.0)
NRBC Absolute: 0 10*3/uL
NRBC Automated: 0 %
Neutrophils %: 41.4 % — ABNORMAL LOW (ref 42.0–74.0)
Neutrophils Absolute: 7.3 10*3/uL (ref 1.6–7.3)
Platelets: 455 10*3/uL — ABNORMAL HIGH (ref 140–440)
RBC: 2.62 x10e6/mcL — ABNORMAL LOW (ref 3.60–5.20)
RDW: 19.1 % — ABNORMAL HIGH (ref 11.0–16.0)
WBC: 17.5 10*3/uL — ABNORMAL HIGH (ref 3.8–10.6)

## 2019-04-18 LAB — MORPHOLOGY CHECK: Platelet Estimate: INCREASED — AB

## 2019-04-18 LAB — CULTURE, URINE: FINAL REPORT: NO GROWTH

## 2019-04-18 NOTE — Discharge Summary (Signed)
 Inpatient Patient Summary               [facility]  [address]  [place], [place] [address]  [phone]  Patient Discharge Instructions     Name: Judith [name], Judith [name]  Current Date: 04/18/2019 14:55:49  DOB: [birth year] MRN: [id number] FIN: [id number]%[phone]  Patient Address: [address] [place] [address]  Patient Phone: [phone]  Primary Care Provider:  Name: PCP,  NONE  Phone:    Immunizations Provided:       Discharge Diagnosis: Fever; Sickle cell pain crisis  Discharged To: TO, ANTICIPATED%>  Home Treatments: TREATMENTS, ANTICIPATED%>  Devices/Equipment: EQUIPMENT REHAB%>  Post Hospital Services: HOSPITAL SERVICES%>  Professional Skilled Services: SKILLED SERVICES%>  Therapist, sports and Community Resources:               SERV AND COMM RES, ANTICIPATED%>  Mode of Discharge Transportation: TRANSPORTATION%>  Discharge Orders          Discharge Patient 04/18/19 14:28:00 EDT, Discharge Home/Self Care         Comment:      Medications   During the course of your visit, your medication list was updated with the most current information. The details of those changes are reflected below:          Medications That Were Updated - Follow Current Instructions  Other Medications  Current sodium bicarbonate (sodium bicarbonate 650 mg oral tablet) 0.5 Tabs Oral (given by mouth) 2 times a day.  Last Dose:____________________    Medications that have not changed  Other Medications  albuterol (Proventil HFA 90 mcg/inh inhalation aerosol) 2 Puffs Inhale (breathe in) every 4 hours as needed.  Last Dose:____________________  bisacodyl (Dulcolax Laxative 5 mg oral delayed release tablet) 1 Tabs Oral (given by mouth) every day as needed., DO NOT CRUSH  Last Dose:____________________  citalopram (CeleXA 40 mg oral tablet) 1 Tabs Oral (given by mouth) every day., SOUND ALIKE / LOOK ALIKE - VERIFY DRUG  Last Dose:____________________  clonazePAM (KlonoPIN 0.5 mg oral tablet) 1 Tabs Oral (given by mouth)  Once a Day (at bedtime)., SOUND ALIKE / LOOK ALIKE - VERIFY DRUG   SOUND ALIKE / LOOK ALIKE - VERIFY DRUG  Last Dose:____________________  cyclobenzaprine (cyclobenzaprine 5 mg oral tablet) 1 Tabs Oral (given by mouth) 3 times a day for 14 Days.,  THIS MEDICATION IS ASSOCIATED  WITH  AN INCREASED RISK OF FALLS.  Last Dose:____________________  diphenhydrAMINE (Benadryl 25 mg oral tablet) 2 Tabs Oral (given by mouth) as needed prn.  Last Dose:____________________  docusate (Colace 100 mg oral capsule) 1 Capsules Oral (given by mouth) 2 times a day as needed as needed for constipation.  Last Dose:____________________  fentaNYL (fentaNYL 50 mcg/hr transdermal film, extended release) 1 Patches Transdermal (apply on the skin) every 72 hours.  Last Dose:____________________  folic acid (folic acid 1 mg oral tablet) 1 Tabs Oral (given by mouth) every day.  Last Dose:____________________  ibuprofen (ibuprofen 800 mg oral tablet) 1 Tabs Oral (given by mouth) 3 times a day as needed for pain.  Last Dose:____________________  mirtazapine (Remeron 15 mg oral tablet) 1 Tabs Oral (given by mouth) Once a Day (at bedtime).  Last Dose:____________________  oxyCODONE (oxyCODONE 30 mg oral tablet) 1 Tabs Oral (given by mouth) every 4 hours as needed for pain.  Last Dose:____________________  promethazine (promethazine 25 mg oral tablet) 1 Tabs Oral (given by mouth) every 6 hours as needed as needed for nausea/vomiting.  Last Dose:____________________  No Longer Take the Following Medications  traZODone (traZODone 150 mg oral tablet) 1 Tabs Oral (given by mouth) Once a Day (at bedtime)., DO NOT CRUSH  Stop Taking Reason:          [facility] would like to thank you for allowing us  to assist you with your healthcare needs. The following includes patient education materials and information regarding your injury/illness.     Sofranko, Takeia D has been given the following list of follow-up instructions, prescriptions, and  patient education materials:  Follow-up Instructions:              With: Address: When:   Dr Truddie Gladis [facility]  Within 1 week   Comments:   post discharge follow up   repeat cbc , bmp at follow up visit       With: Address: When:   NONE PCP                      It is important to always keep an active list of medications available so that you can share with other providers and manage your medications appropriately. As an additional courtesy, we are also providing you with your final active medications list that you can keep with you.            albuterol (Proventil HFA 90 mcg/inh inhalation aerosol) 2 Puffs Inhale (breathe in) every 4 hours as needed.  bisacodyl (Dulcolax Laxative 5 mg oral delayed release tablet) 1 Tabs Oral (given by mouth) every day as needed., DO NOT CRUSH  citalopram (CeleXA 40 mg oral tablet) 1 Tabs Oral (given by mouth) every day., SOUND ALIKE / LOOK ALIKE - VERIFY DRUG  clonazePAM (KlonoPIN 0.5 mg oral tablet) 1 Tabs Oral (given by mouth) Once a Day (at bedtime)., SOUND ALIKE / LOOK ALIKE - VERIFY DRUG   SOUND ALIKE / LOOK ALIKE - VERIFY DRUG  cyclobenzaprine (cyclobenzaprine 5 mg oral tablet) 1 Tabs Oral (given by mouth) 3 times a day for 14 Days.,  THIS MEDICATION IS ASSOCIATED  WITH  AN INCREASED RISK OF FALLS.  diphenhydrAMINE (Benadryl 25 mg oral tablet) 2 Tabs Oral (given by mouth) as needed prn.  docusate (Colace 100 mg oral capsule) 1 Capsules Oral (given by mouth) 2 times a day as needed as needed for constipation.  fentaNYL (fentaNYL 50 mcg/hr transdermal film, extended release) 1 Patches Transdermal (apply on the skin) every 72 hours.  folic acid (folic acid 1 mg oral tablet) 1 Tabs Oral (given by mouth) every day.  ibuprofen (ibuprofen 800 mg oral tablet) 1 Tabs Oral (given by mouth) 3 times a day as needed for pain.  mirtazapine (Remeron 15 mg oral tablet) 1 Tabs Oral (given by mouth) Once a Day (at bedtime).  oxyCODONE (oxyCODONE 30 mg oral tablet) 1 Tabs Oral (given by  mouth) every 4 hours as needed for pain.  promethazine (promethazine 25 mg oral tablet) 1 Tabs Oral (given by mouth) every 6 hours as needed as needed for nausea/vomiting.  sodium bicarbonate (sodium bicarbonate 650 mg oral tablet) 0.5 Tabs Oral (given by mouth) 2 times a day.      Take only the medications listed above. Contact your doctor prior to taking any medications not on this list.        Discharge instructions, if any, will display below     Instructions for Diet: INSTRUCTIONS FOR DIET%>A Healthy Diet   Instructions for Supplements: SUPPLEMENT INSTRUCTIONS%>   Instructions for Activity: INSTRUCTIONS  FOR ACTIVITY%>As Tolerated   Instructions for Wound Care: INSTRUCTIONS FOR WOUND CARE%>     Medication leaflets, if any, will display below     Patient education materials, if any, will display below           IS IT A STROKE? Act FAST and Check for these signs:    FACE                         Does the face look uneven?    ARM                         Does one arm drift down?    SPEECH                    Does their speech sound strange?    TIME                         Call 9-1-1 at any sign of stroke  ---------------------------------------------------------------------------------------------------------------------------  Heart Attack Signs  Chest discomfort: Most heart attacks involve discomfort in the center of the chest and lasts more than a few minutes, or goes away and comes back. It can feel like uncomfortable pressure, squeezing, fullness or pain.  Discomfort in upper body: Symptoms can include pain or discomfort in one or both arms, back, neck, jaw or stomach.  Shortness of breath: With or without discomfort.  Other signs: Breaking out in a cold sweat, nausea, or lightheaded.  Remember, MINUTES DO MATTER. If you experience any of these heart attack warning signs, call 9-1-1 to get immediate medical attention!      ---------------------------------------------------------------------------------------------------------------------------  [facility] allows you to manage your health, view your test results, and retrieve your discharge documents from your hospital stay securely and conveniently from your computer.  To begin the enrollment process, visit [email or web address] Click on "Sign up now" under [facility].

## 2019-04-18 NOTE — Case Communication (Signed)
CM Discharge Planning Assessment - Text       CM Discharge Plan Entered On:  04/18/2019 15:10 EDT    Performed On:  04/18/2019 15:09 EDT by Judith [name]               CM Discharge Plan   Discharge To :   Family support   CM Home/Lay Caregiver Name/Relationship :   Judith [name] ( Father)   CM Home/Lay Caregiver Contact Number :   [phone]   [facility] Services :   Home Self-Care     CM Medicare Patient :   Yes   CM 3 MN Stay Validated :   No   CM StCM Statusatus :   IP   CM Date of 3rd MN Occurrence :   04/19/2019 0:00 EDT   CM Progress Note :   04/18/19: 33 y/o admitted w/sickle cell crisis. Pt is 30 day re-admit. Per MD rounds, pt w/17 ER visits since January, hx of noncompliance. Cm met w/pt who stated that she lives w/her mother and is followed by PCP, Judith [name] at [facility]. Rx meds are obtained from [facility] w/no issues reported. Pt has no DME. Medicare IM presented, signed and placed in chart, copy provided to pt. Cm will cont to to monitor and assist w/dc needs (FWS)    ** Pt w/dc orders, no dc needs indicated (FWS)     Discharge Planning Assessment Complete :   Yes   Judith [name],  Judith [name] - 12/26/2540 70:62 EDT

## 2019-04-18 NOTE — Progress Notes (Signed)
Progress Note-Nurse               I found loose pill in the pts room. I was able to identify it as flexeril. I asked the pt if she has been taking any outside medications while in the hospital. She denies this. I then informed her that we need to know because we do not want to cause her to overdose on any medications. Pt still denies taking any outside medications. Will continue to monitor. MD has been notified.   Signature Line     Electronically Signed on 04/18/2019 12:41 PM EDT   ________________________________________________   Roxan Hockey, RN, Daneil Dan

## 2019-04-18 NOTE — Case Communication (Signed)
CM Discharge Planning Assessment - Text       CM Progress Note Entered On:  04/18/2019 15:30 EDT    Performed On:  04/18/2019 15:30 EDT by Bayard Males               CM Progress Note   CM Home/Lay Caregiver Name/Relationship :   Cameka Rae ( Father)   CM Home/Lay Caregiver Contact Number :   [phone]   CM Initial Tentative Discharge Plan :   Home w/Mother   CM Alternate Discharge Plan :   [facility]   CM Progress Note :   04/18/19: 33 y/o admitted w/sickle cell crisis. Pt is 34 day re-admit. Per MD rounds, pt w/17 ER visits since January, hx of noncompliance. Cm met w/pt who stated that she lives w/her mother and is followed by PCP, [name] at [facility]. [facility] w/no issues reported. Pt has no DME. Medicare IM presented, signed and placed in chart, copy provided to pt. Cm will cont to to monitor and assist w/dc needs (FWS)    ** Pt w/dc orders, no dc needs indicated (FWS)    Per Tech, pt's vitals checked, fever, Discharge d/c'd. Cm will cont to monitor for needs (FWS)     STEVENS,  FELICIA - 2/95/6213 08:65 EDT

## 2019-04-18 NOTE — Progress Notes (Signed)
Chaplaincy Note - Text       Chaplaincy Note Entered On:  04/18/2019 9:28 EDT    Performed On:  04/18/2019 9:26 EDT by Hezzie Bump               Chaplaincy Consult   Faith/Denomination :   Ephriam Knuckles   Additional Information, Chaplaincy :   The Pt is in too much pain and is trying to get some rest for me to assess at this time. I shared encouragement for her to feel better and has planned a F/U visit.    Hezzie Bump - 04/18/2019 9:26 EDT

## 2019-04-18 NOTE — Progress Notes (Signed)
Progress Note-Nurse               pt wanted to leave AMA. I notified the MD. I deactivated her port and heparinized it. She then signed the AMA form and left the floor.   Signature Line     Electronically Signed on 04/18/2019 04:48 PM EDT   ________________________________________________   Roxan Hockey, RN, Daneil Dan

## 2019-04-18 NOTE — Nursing Note (Signed)
Nursing Discharge Summary - Text       Physician Discharge Summary Entered On:  04/18/2019 13:01 EDT    Performed On:  04/18/2019 13:01 EDT by Kerin Salen               DC Information   Provider Instructions for Diet :   A Healthy Diet   Provider Instructions for Activity :   As Tolerated   Kassia Demarinis-MD,  Rual Vermeer - 04/18/2019 13:01 EDT

## 2019-04-18 NOTE — Discharge Summary (Signed)
 Inpatient Clinical Summary             [facility]  Post-Acute Care Transfer Instructions  PERSON INFORMATION   Name: Judith [name], Judith [name]  MRN: [id number]    FIN#: [id number]   PHYSICIANS  Admitting Physician: LORINE RUSH D  Attending Physician: LORINE RUSH BIRCH   PCP: PCP,  NONE  Discharge Diagnosis:  Fever; Sickle cell pain crisis  Comment:       PATIENT EDUCATION INFORMATION  Instructions:               Medication Leaflets:               Follow-up:                           With: Address: When:   Dr Truddie Gladis [facility]  Within 1 week   Comments:   post discharge follow up   repeat cbc , bmp at follow up visit       With: Address: When:   NONE PCP                               MEDICATION LIST  Medication Reconciliation at Discharge:          Medications That Were Updated - Follow Current Instructions  Other Medications  Current sodium bicarbonate (sodium bicarbonate 650 mg oral tablet) 0.5 Tabs Oral (given by mouth) 2 times a day.  Last Dose:____________________    Medications that have not changed  Other Medications  albuterol (Proventil HFA 90 mcg/inh inhalation aerosol) 2 Puffs Inhale (breathe in) every 4 hours as needed.  Last Dose:____________________  bisacodyl (Dulcolax Laxative 5 mg oral delayed release tablet) 1 Tabs Oral (given by mouth) every day as needed., DO NOT CRUSH  Last Dose:____________________  citalopram (CeleXA 40 mg oral tablet) 1 Tabs Oral (given by mouth) every day., SOUND ALIKE / LOOK ALIKE - VERIFY DRUG  Last Dose:____________________  clonazePAM (KlonoPIN 0.5 mg oral tablet) 1 Tabs Oral (given by mouth) Once a Day (at bedtime)., SOUND ALIKE / LOOK ALIKE - VERIFY DRUG   SOUND ALIKE / LOOK ALIKE - VERIFY DRUG  Last Dose:____________________  cyclobenzaprine (cyclobenzaprine 5 mg oral tablet) 1 Tabs Oral (given by mouth) 3 times a day for 14 Days.,  THIS MEDICATION IS ASSOCIATED  WITH  AN INCREASED RISK OF FALLS.  Last Dose:____________________  diphenhydrAMINE  (Benadryl 25 mg oral tablet) 2 Tabs Oral (given by mouth) as needed prn.  Last Dose:____________________  docusate (Colace 100 mg oral capsule) 1 Capsules Oral (given by mouth) 2 times a day as needed as needed for constipation.  Last Dose:____________________  fentaNYL (fentaNYL 50 mcg/hr transdermal film, extended release) 1 Patches Transdermal (apply on the skin) every 72 hours.  Last Dose:____________________  folic acid (folic acid 1 mg oral tablet) 1 Tabs Oral (given by mouth) every day.  Last Dose:____________________  ibuprofen (ibuprofen 800 mg oral tablet) 1 Tabs Oral (given by mouth) 3 times a day as needed for pain.  Last Dose:____________________  mirtazapine (Remeron 15 mg oral tablet) 1 Tabs Oral (given by mouth) Once a Day (at bedtime).  Last Dose:____________________  oxyCODONE (oxyCODONE 30 mg oral tablet) 1 Tabs Oral (given by mouth) every 4 hours as needed for pain.  Last Dose:____________________  promethazine (promethazine 25 mg oral tablet) 1 Tabs Oral (given by mouth) every 6 hours as needed  as needed for nausea/vomiting.  Last Dose:____________________  No Longer Take the Following Medications  traZODone (traZODone 150 mg oral tablet) 1 Tabs Oral (given by mouth) Once a Day (at bedtime)., DO NOT CRUSH  Stop Taking Reason:          Patient's Final Home Medication List Upon Discharge:           albuterol (Proventil HFA 90 mcg/inh inhalation aerosol) 2 Puffs Inhale (breathe in) every 4 hours as needed.  bisacodyl (Dulcolax Laxative 5 mg oral delayed release tablet) 1 Tabs Oral (given by mouth) every day as needed., DO NOT CRUSH  citalopram (CeleXA 40 mg oral tablet) 1 Tabs Oral (given by mouth) every day., SOUND ALIKE / LOOK ALIKE - VERIFY DRUG  clonazePAM (KlonoPIN 0.5 mg oral tablet) 1 Tabs Oral (given by mouth) Once a Day (at bedtime)., SOUND ALIKE / LOOK ALIKE - VERIFY DRUG   SOUND ALIKE / LOOK ALIKE - VERIFY DRUG  cyclobenzaprine (cyclobenzaprine 5 mg oral tablet) 1 Tabs Oral (given by  mouth) 3 times a day for 14 Days.,  THIS MEDICATION IS ASSOCIATED  WITH  AN INCREASED RISK OF FALLS.  diphenhydrAMINE (Benadryl 25 mg oral tablet) 2 Tabs Oral (given by mouth) as needed prn.  docusate (Colace 100 mg oral capsule) 1 Capsules Oral (given by mouth) 2 times a day as needed as needed for constipation.  fentaNYL (fentaNYL 50 mcg/hr transdermal film, extended release) 1 Patches Transdermal (apply on the skin) every 72 hours.  folic acid (folic acid 1 mg oral tablet) 1 Tabs Oral (given by mouth) every day.  ibuprofen (ibuprofen 800 mg oral tablet) 1 Tabs Oral (given by mouth) 3 times a day as needed for pain.  mirtazapine (Remeron 15 mg oral tablet) 1 Tabs Oral (given by mouth) Once a Day (at bedtime).  oxyCODONE (oxyCODONE 30 mg oral tablet) 1 Tabs Oral (given by mouth) every 4 hours as needed for pain.  promethazine (promethazine 25 mg oral tablet) 1 Tabs Oral (given by mouth) every 6 hours as needed as needed for nausea/vomiting.  sodium bicarbonate (sodium bicarbonate 650 mg oral tablet) 0.5 Tabs Oral (given by mouth) 2 times a day.         Comment:       ORDERS          Order Name Order Details   Discharge Patient 04/18/19 14:28:00 EDT, Discharge Home/Self Care

## 2019-04-18 NOTE — Case Communication (Signed)
CM D/C Planning Assessment Ongoing- Text       CM Admission Assessment Entered On:  04/18/2019 15:09 EDT    Performed On:  04/18/2019 15:04 EDT by Bayard Males               CM Admission Assessment   CM Reason for Care Management Referral :   Discharge planning assessment   CM Insurance Information :   Managed Medicare   CM Insurance Co. Name :   [place] Name of Patient Pharmacy :   [facility] Date and Time Inpatient Order :   04/17/2019 19:16 EDT   CM Date and Time Admission IM :   04/18/2019 10:20 EDT   CM Date of 3rd MN Occurrence :   04/19/2019 EDT   CM Patient has PCP :   Yes   CM Name of PCP :   [name] [facility])   Discharge Transporation Needs :   No   CM Assessment Discussion With :   Patient   CM Home/Lay Caregiver Name/Relationship :   Jani Gravel ( Father)   CM Home/Lay Caregiver Contact Number :   [phone]   CM Living Situation :   Family support   CM Patient Admitted From :   Home w/Mother   CM Initial Tentative Discharge Plan :   Home w/Mother   CM Alternate Discharge Plan :   [facility]   Anticipated Hosp Related Barriers to DC :   None identified   CM Home Barriers :   None   CM Progress Note :   04/18/19: 33 y/o admitted w/sickle cell crisis. Pt is 30 day re-admit. Per MD rounds, pt w/17 ER visits since January, hx of noncompliance. Cm met w/pt who stated that she lives w/her mother and is followed by PCP, [name] at [facility]. [facility]. Pt has no DME. Medicare IM presented, signed and placed in chart, copy provided to pt. Cm will cont to to monitor and assist w/dc needs (FWS)     STEVENS,  FELICIA - 05/07/6377 58:85 EDT

## 2019-04-22 LAB — CULTURE, BLOOD 1

## 2019-04-23 LAB — CULTURE, BLOOD 1

## 2019-06-09 LAB — COMPREHENSIVE METABOLIC PANEL
ALT: 13 U/L (ref 0–33)
AST: 22 U/L (ref 0–32)
Albumin/Globulin Ratio: 1.71 mmol/L (ref 1.00–2.00)
Albumin: 4.8 g/dL (ref 3.5–5.2)
Alk Phosphatase: 102 U/L (ref 35–117)
Anion Gap: 15 mmol/L (ref 2–17)
BUN: 15 mg/dL (ref 6–20)
CO2: 14 mmol/L — ABNORMAL LOW (ref 22–29)
Calcium: 9.1 mg/dL (ref 8.6–10.0)
Chloride: 110 mmol/L — ABNORMAL HIGH (ref 98–107)
Creatinine: 0.5 mg/dL (ref 0.5–0.9)
GFR African American: 146 mL/min/{1.73_m2} (ref >=90)
GFR Non-African American: 126 mL/min/{1.73_m2} (ref >=90)
Globulin: 2.8 g/dL (ref 1.9–4.4)
Glucose: 136 mg/dL — ABNORMAL HIGH (ref 70–99)
OSMOLALITY CALCULATED: 280 mOsm/kg (ref 270–287)
Potassium: 3.2 mmol/L — ABNORMAL LOW (ref 3.5–5.3)
Sodium: 139 mmol/L (ref 135–145)
Total Bilirubin: 1.2 mg/dL (ref 0.00–1.20)
Total Protein: 7.6 g/dL (ref 6.4–8.3)

## 2019-06-09 LAB — BASIC METABOLIC PANEL
Anion Gap: 12 mmol/L (ref 2–17)
BUN: 12 mg/dL (ref 6–20)
CO2: 13 mmol/L — ABNORMAL LOW (ref 22–29)
Calcium: 7.7 mg/dL — ABNORMAL LOW (ref 8.6–10.0)
Chloride: 116 mmol/L — ABNORMAL HIGH (ref 98–107)
Creatinine: 0.5 mg/dL (ref 0.5–0.9)
GFR African American: 146 mL/min/{1.73_m2} (ref >=90)
GFR Non-African American: 126 mL/min/{1.73_m2} (ref >=90)
Glucose: 92 mg/dL (ref 70–99)
OSMOLALITY CALCULATED: 281 mOsm/kg (ref 270–287)
Potassium: 3.7 mmol/L (ref 3.5–5.3)
Sodium: 141 mmol/L (ref 135–145)

## 2019-06-09 LAB — DIFFERENTIAL, MANUAL
Absolute Bands #: 0.9 10*3/uL
Absolute Baso #: 0.2 10*3/uL (ref 0.0–0.2)
Absolute Lymph #: 4.5 10*3/uL — ABNORMAL HIGH (ref 1.0–3.2)
Absolute Mono #: 1.3 10*3/uL — ABNORMAL HIGH (ref 0.3–1.0)
Bands Relative: 4 % (ref 0–5)
Basophils %: 1 % (ref 0–2)
Lymphocytes: 21 % (ref 15–45)
Monocytes: 6 % (ref 4–12)
Neutrophils %. Manual count: 68 % (ref 42–74)
Neutrophils Absolute: 14.6 10*3/uL — ABNORMAL HIGH (ref 1.6–7.3)
Platelet Estimate: INCREASED — AB
RBC Morphology: ABNORMAL — AB

## 2019-06-09 LAB — CBC
Hematocrit: 23.3 % — ABNORMAL LOW (ref 34.0–47.0)
Hemoglobin: 8.1 g/dL — ABNORMAL LOW (ref 11.5–15.7)
MCH: 28 pg (ref 27.0–34.5)
MCHC: 34.8 g/dL (ref 32.0–36.0)
MCV: 80.6 fL — ABNORMAL LOW (ref 81.0–99.0)
MPV: 9.9 fL (ref 7.2–13.2)
NRBC Absolute: 0 10*3/uL (ref 0.000–0.012)
NRBC Automated: 0 % (ref 0.0–0.2)
Platelets: 479 10*3/uL — ABNORMAL HIGH (ref 140–440)
RBC: 2.89 x10e6/mcL — ABNORMAL LOW (ref 3.60–5.20)
RDW: 16.5 % — ABNORMAL HIGH (ref 11.0–16.0)
WBC: 21.5 10*3/uL — ABNORMAL HIGH (ref 3.8–10.6)

## 2019-06-09 LAB — RETICULOCYTES
RBC: 2.89 x10e6/mcL — ABNORMAL LOW (ref 3.60–5.20)
Retic Ct Abs: 0.1055 /mcL (ref 0.0210–0.1080)
Retic Ct Pct: 3.7 % — ABNORMAL HIGH (ref 0.5–2.0)

## 2019-06-09 NOTE — Discharge Summary (Signed)
 ED Clinical Summary                     [facility]  [address]  [place], [place] [address]  [phone]          PERSON INFORMATION  Name: Judith [name], Judith [name] Age:  33 Years DOB: 02-13-1986   Sex: Female Language: English PCP: PCP,  NONE   Marital Status: Single Phone: [phone] Med Service: MED-Medicine   MRN: [id number] Acct# [id number] Arrival: 06/09/2019 08:25:00   Visit Reason: Sickle cell pain/fall; SICKLE CELL, FALL Acuity: 3 LOS: 000 05:19   Address:    [address] [place] [address]   Diagnosis:    Acidosis; Back strain; Dehydration; Head injury; Vasoocclusive sickle cell crisis  Medications:          Medications that have not changed  Other Medications  albuterol (Proventil HFA 90 mcg/inh inhalation aerosol) 2 Puffs Inhale (breathe in) every 4 hours as needed.  Last Dose:____________________  bisacodyl (Dulcolax Laxative 5 mg oral delayed release tablet) 1 Tabs Oral (given by mouth) every day as needed., DO NOT CRUSH  Last Dose:____________________  citalopram (CeleXA 40 mg oral tablet) 1 Tabs Oral (given by mouth) every day., SOUND ALIKE / LOOK ALIKE - VERIFY DRUG  Last Dose:____________________  clonazePAM (KlonoPIN 0.5 mg oral tablet) 1 Tabs Oral (given by mouth) Once a Day (at bedtime)., SOUND ALIKE / LOOK ALIKE - VERIFY DRUG   SOUND ALIKE / LOOK ALIKE - VERIFY DRUG  Last Dose:____________________  cyclobenzaprine (cyclobenzaprine 5 mg oral tablet) 1 Tabs Oral (given by mouth) 3 times a day for 14 Days.,  THIS MEDICATION IS ASSOCIATED  WITH  AN INCREASED RISK OF FALLS.  Last Dose:____________________  diphenhydrAMINE (Benadryl 25 mg oral tablet) 2 Tabs Oral (given by mouth) as needed prn.  Last Dose:____________________  docusate (Colace 100 mg oral capsule) 1 Capsules Oral (given by mouth) 2 times a day as needed as needed for constipation.  Last Dose:____________________  fentaNYL (fentaNYL 50 mcg/hr transdermal film, extended release) 1 Patches  Transdermal (apply on the skin) every 72 hours.  Last Dose:____________________  folic acid (folic acid 1 mg oral tablet) 1 Tabs Oral (given by mouth) every day.  Last Dose:____________________  ibuprofen (ibuprofen 800 mg oral tablet) 1 Tabs Oral (given by mouth) 3 times a day as needed for pain.  Last Dose:____________________  mirtazapine (Remeron 15 mg oral tablet) 1 Tabs Oral (given by mouth) Once a Day (at bedtime).  Last Dose:____________________  oxyCODONE (oxyCODONE 30 mg oral tablet) 1 Tabs Oral (given by mouth) every 4 hours as needed for pain.  Last Dose:____________________  promethazine (promethazine 25 mg oral tablet) 1 Tabs Oral (given by mouth) every 6 hours as needed as needed for nausea/vomiting.  Last Dose:____________________  sodium bicarbonate (sodium bicarbonate 650 mg oral tablet) 0.5 Tabs Oral (given by mouth) 2 times a day.  Last Dose:____________________      Medications Administered During Visit:                Medication Dose Route   hydromorphone 1 mg IV Push   diphenhydrAMINE 25 mg IV Push   promethazine 12.5 mg IV Push   acetaminophen 1000 mg Oral   hydromorphone 1 mg IV Push   promethazine 12.5 mg IV Push   diphenhydrAMINE 12.5 mg IV Push   Sodium Chloride 0.9% 1000 mL IV Piggyback   potassium chloride 40 mEq Oral   Sodium Chloride  0.9% 1000 mL IV Piggyback   heparin flush 500 units Intracath               Allergies      Rocephin      fentaNYL      morphine      contrast media (iodine-based)      Bactrim (Itching)      Major Tests and Procedures:  The following procedures and tests were performed during your ED visit.  COMMON PROCEDURES%>  COMMON PROCEDURES COMMENTS%>                PROVIDER INFORMATION               Provider Role Assigned Unassigned   DITTRICH-MD, DEBBY BROCKS ED Provider 06/09/2019 08:27:10    Gretta, RN, Merlynn RAMAN ED Nurse 06/09/2019 08:31:56        Attending Physician:  DITTRICH-MD,  DEBBY BROCKS      Admit Doc  DITTRICH-MD,  DEBBY BROCKS     Consulting Doc       VITALS  INFORMATION  Vital Sign Triage Latest   Temp Oral ORAL_1%> ORAL%>   Temp Temporal TEMPORAL_1%> TEMPORAL%>   Temp Intravascular INTRAVASCULAR_1%> INTRAVASCULAR%>   Temp Axillary AXILLARY_1%> AXILLARY%>   Temp Rectal RECTAL_1%> RECTAL%>   02 Sat 100 % 100 %   Respiratory Rate RATE_1%> RATE%>   Peripheral Pulse Rate PULSE RATE_1%> PULSE RATE%>   Apical Heart Rate HEART RATE_1%> HEART RATE%>   Blood Pressure BLOOD PRESSURE_1%>/ BLOOD PRESSURE_1%>76 mmHg BLOOD PRESSURE%> / BLOOD PRESSURE%>80 mmHg                 Immunizations      No Immunizations Documented This Visit          DISCHARGE INFORMATION   Discharge Disposition: A Outpt-Left Against Med Advice   Discharge Location:  Home   Discharge Date and Time:  06/09/2019 13:44:07   ED Checkout Date and Time:  06/09/2019 13:44:07     DEPART REASON INCOMPLETE INFORMATION               Depart Action Incomplete Reason   Interactive View/I&O Recently assessed               Problems      Active           Hypertension          Sickle cell              Smoking Status      Former smoker         PATIENT EDUCATION INFORMATION  Instructions:     Sickle Cell Anemia, Adult; Dehydration, Adult     Follow up:                   With: Address: When:   Return to Emergency Department     Comments:   if you have any new or worsening symptoms              ED PROVIDER DOCUMENTATION     Patient:   Judith [name], Judith [name]            MRN: [id number]            FIN: [id number]               Age:   33 years     Sex:  Female     DOB:  05/12/1986   Associated Diagnoses:   Acidosis; Dehydration; Vasoocclusive  sickle cell crisis; Head injury; Back strain   Author:   DITTRICH-MD,  DEBBY BROCKS      Basic Information   Additional information: Chief Complaint from Nursing Triage Note   Chief Complaint  Chief Complaint: Pt c/o pain from sickle cell. Reports fall prior to arrival down 5 steps. Sliding down on back. Swelling noted to left arm/elbow and back pain. (06/09/19 08:28:00).      History of Present Illness   The patient  presents following fall.  The onset was just prior to arrival.  The occurrence was single episode.  The fall was described as slipped, fell down  5  steps.  The location where the incident occurred was at home.  The character of symptoms is pain.  The degree at present is moderate.  The exacerbating factor is none.  The relieving factor is none.  Risk factors consist of none.  Therapy today: none.  Preceding symptoms none.  Associated symptoms: headache.  Additional history: none.  33 year old female with sickle cell anemia presents status post fall.  Patient tells me that she was already coming to the hospital because of a sickle cell pain crisis when she slipped on the top step down 5 metal stairs.  She reports occipital head injury, back pain in the thoracic part of the back, as well as left elbow pain..        Review of Systems   Constitutional symptoms:  Negative except as documented in HPI.   Skin symptoms:  Negative except as documented in HPI.   Eye symptoms:  Negative except as documented in HPI.   Respiratory symptoms:  Negative except as documented in HPI.   Cardiovascular symptoms:  Negative except as documented in HPI.   Gastrointestinal symptoms:  Negative except as documented in HPI.   Genitourinary symptoms:  Negative except as documented in HPI.   Musculoskeletal symptoms:  Negative except as documented in HPI.   Neurologic symptoms:  Negative except as documented in HPI.   Psychiatric symptoms:  Negative except as documented in HPI.             Additional review of systems information: All other systems reviewed and otherwise negative.      Health Status   Allergies:    Allergic Reactions (Selected)  Moderate  Bactrim- Itching.  Unknown  Contrast media (iodine-based)- No reactions were documented.  FentaNYL- No reactions were documented.  Morphine- No reactions were documented.  Rocephin- No reactions were documented..   Medications:  (Selected)   Inpatient Medications  Ordered  Benadryl: 25 mg,  0.5 mL, IV Push, Once  Dilaudid: 1 mg, 0.5 mL, IV Push, Once  promethazine IV - High Alert: 12.5 mg, 0.5 mL, IV Push, Once  Documented Medications  Documented  Benadryl 25 mg oral tablet: 50 mg, 2 tabs, Oral, PRN: prn, 0 Refill(s)  CeleXA 40 mg oral tablet: 40 mg, 1 tabs, Oral, Daily, 30 tabs, 0 Refill(s)  Colace 100 mg oral capsule: 100 mg, 1 caps, Oral, BID, PRN: as needed for constipation, 0 Refill(s)  Dulcolax Laxative 5 mg oral delayed release tablet: 5 mg, 1 tabs, Oral, Daily, PRN, 0 Refill(s)  KlonoPIN 0.5 mg oral tablet: 0.5 mg, 1 tabs, Oral, Once a Day (at bedtime), 0 Refill(s)  Proventil HFA 90 mcg/inh inhalation aerosol: 2 puffs, Inhale, q4hr, PRN, 6.7 g, 0 Refill(s)  Remeron 15 mg oral tablet: 15 mg, 1 tabs, Oral, Once a Day (at bedtime), 30 tabs, 0 Refill(s)  cyclobenzaprine 5 mg oral  tablet: 5 mg, 1 tabs, Oral, TID, for 14 days, 42 tabs, 0 Refill(s)  fentaNYL 50 mcg/hr transdermal film, extended release: 1 patches, TD, q72hr, 0 Refill(s)  folic acid 1 mg oral tablet: 1 mg, 1 tabs, Oral, Daily, 0 Refill(s)  ibuprofen 800 mg oral tablet: 800 mg, 1 tabs, Oral, TID, PRN: for pain, 30 tabs, 0 Refill(s)  oxyCODONE 30 mg oral tablet: 30 mg, 1 tabs, Oral, q4hr, PRN: for pain, 0 Refill(s)  promethazine 25 mg oral tablet: 25 mg, 1 tabs, Oral, q6hr, PRN: as needed for nausea/vomiting, 0 Refill(s)  sodium bicarbonate 650 mg oral tablet: 325 mg, 0.5 tabs, Oral, BID, 0 Refill(s).      Past Medical/ Family/ Social History   Medical history: Reviewed as documented in chart.   Surgical history: Reviewed as documented in chart.   Family history: Not significant.   Social history: Reviewed as documented in chart.   Problem list:    Active Problems (4)  Alteration in comfort: pain   Hypertension   Shortness of breath   Sickle cell   .      Physical Examination               Vital Signs   Vital Signs   06/09/2019 8:28 EDT Systolic Blood Pressure 122 mmHg    Diastolic Blood Pressure 76 mmHg    Temperature Oral 37.0 degC     Heart Rate Monitored 88 bpm    Respiratory Rate 22 br/min  HI    SpO2 100 %   .   Measurements   06/09/2019 8:30 EDT Body Mass Index est meas 23.09 kg/m2    Body Mass Index Measured 23.09 kg/m2   06/09/2019 8:28 EDT Height/Length Measured 160 cm    Weight Dosing 59.1 kg   .   Basic Oxygen Information   06/09/2019 8:33 EDT Oxygen Therapy Room air   06/09/2019 8:28 EDT SpO2 100 %    Oxygen Therapy Room air   .   General:  Alert, no acute distress.    Skin:  Warm, dry, pink.    Head:  Normocephalic.   Neck:  Supple, trachea midline.    Eye:  Pupils are equal, round and reactive to light, extraocular movements are intact.    Ears, nose, mouth and throat:  Oral mucosa moist.   Cardiovascular:  Regular rate and rhythm.   Respiratory:  Lungs are clear to auscultation, respirations are non-labored.    Back:  Normal range of motion.   Musculoskeletal:  Normal ROM, no tenderness.    Chest wall   Gastrointestinal:  Soft, Nontender, Non distended, Normal bowel sounds, No organomegaly.    Neurological:  Alert and oriented to person, place, time, and situation, No focal neurological deficit observed, normal sensory observed, normal motor observed.    Lymphatics:  No lymphadenopathy.   Psychiatric:  Cooperative, appropriate mood & affect.       Medical Decision Making   Rationale:  33 year old female with history of sickle cell anemia presents status post a mechanical fall with superimposed pain crisis.  Plan will be for labs, reticulocyte count, CT head, x-ray thoracic spine, x-ray elbow.  Pain and nausea medications ordered..   Results review:  All Results   06/09/2019 10:15 EDT XR Spine Thoracic 3 Views IMAGE (Modified)   06/09/2019 10:15 EDT ED Note-Nursing     06/09/2019 10:14 EDT XR Elbow 2 Views Left IMAGE (Modified)   06/09/2019 10:14 EDT XR Chest 1 View Portable IMAGE (Modified)  06/09/2019 13:19 EDT Estimated Creatinine Clearance 131.10 mL/min    06/09/2019 9:19 EDT CT Head or Brain w/o Contrast IMAGE (Modified)   06/09/2019 8:48 EDT ED  Note-Physician Fall *ED (In Progress)   06/09/2019 12:45 EDT Sodium Lvl 141 mmol/L     Potassium Lvl 3.7 mmol/L     Chloride 116 mmol/L  HI     CO2 13 mmol/L  LOW     Glucose Random 92 mg/dL     BUN 12 mg/dL     Creatinine Lvl 0.5 mg/dL     AGAP 12 mmol/L     Osmolality Calc 281 mOsm/kg     Calcium Lvl 7.7 mg/dL  LOW     eGFR AA 853 fO/fpw/8.26f     eGFR Non-AA 126 mL/min/1.45m    06/09/2019 8:32 EDT ED Triage Note     06/09/2019 8:28 EDT ED Triage Note     06/09/2019 8:25 EDT Consent Forms     06/09/2019 12:12 EDT Systolic Blood Pressure 125 mmHg     Diastolic Blood Pressure 80 mmHg     Temperature Oral 6.9 degC  LOW     Heart Rate Monitored 83 bpm     Respiratory Rate 16 br/min     SpO2 100 %     Oxygen Therapy Room air    06/09/2019 10:52 EDT Numeric Rating Pain Scale 8     Numeric Rating Pain Scale 8     Pasero Opioid Induced Sedation Scale 1=Awake and alert     Pasero Opioid Induced Sedation Scale 1=Awake and alert     Med Response Med Response     Med Response Med Response     ED Medication Response No adverse reaction, Symptoms improved     ED Medication Response No adverse reaction, Symptoms improved    06/09/2019 10:44 EDT Systolic Blood Pressure 125 mmHg     Diastolic Blood Pressure 80 mmHg     Heart Rate Monitored 80 bpm     Respiratory Rate 16 br/min     SpO2 100 %     Numeric Rating Pain Scale 8     Oxygen Therapy Room air    06/09/2019 10:15 EDT ED RN Progress Note Pt requesting additional pain and nausea meds.    06/09/2019 9:39 EDT Systolic Blood Pressure 118 mmHg     Diastolic Blood Pressure 75 mmHg     Heart Rate Monitored 73 bpm     Respiratory Rate 16 br/min     SpO2 100 %     Numeric Rating Pain Scale 10 = Worst possible pain     Pasero Opioid Induced Sedation Scale 1=Awake and alert     Pasero Opioid Induced Sedation Scale 1=Awake and alert     Med Response Med Response     Med Response Med Response     Oxygen Therapy Room air     ED Medication Response No adverse reaction, No change in symptoms     ED  Medication Response No adverse reaction, No change in symptoms    06/09/2019 9:26 EDT Estimated Creatinine Clearance 131.10 mL/min    06/09/2019 9:05 EDT Chest Right      Central IV Indication: Provider order     Central IV Activity: Present on admission     Central IV Access: Power Implanted [employer] IV Number of Ports: Single     Proximal Central IV Needle Size: 3/4 inch/20G    06/09/2019 8:59 EDT WBC 21.5 x10e3/mcL  HI     RBC 2.89 x10e6/mcL  LOW     RBC 2.89 x10e6/mcL  LOW     Hgb 8.1 g/dL  LOW     HCT 76.6 %  LOW     MCV 80.6 fL  LOW     MCH 28.0 pg     MCHC 34.8 g/dL     RDW 83.4 %  HI     Platelet 479 x10e3/mcL  HI     MPV 9.9 fL     NRBC Absolute Auto 0.000 x10e3/mcL     NRBC Percent Auto 0.0 %     Neutrophil M Pct 68 %     Bands M Pct 4 %     Lymphcyte M Pct 21 %     Monocyte M Pct 6 %     Basophil M Pct 1 %     Neutrophil M # 14.6 x10e3/mcL  HI     Bands M # 0.9 x10e3/mcL  NA     Lymphocyte M # 4.5 x10e3/mcL  HI     Monocyte M # 1.3 x10e3/mcL  HI     Basophil M # 0.2 x10e3/mcL     Anisocytosis Few     Hypochromia Few     Ovalocytes Few     PLT estimate Increased     Polychromasia Few     RBC morphology Abnormal     Target cells Few     Retic Percent 3.7 %  HI     Absolute Retic 0.1055 /mcL     Sodium Lvl 139 mmol/L     Potassium Lvl 3.2 mmol/L  LOW     Chloride 110 mmol/L  HI     CO2 14 mmol/L  LOW     Glucose Random 136 mg/dL  HI     BUN 15 mg/dL     Creatinine Lvl 0.5 mg/dL     AGAP 15 mmol/L     Osmolality Calc 280 mOsm/kg     Calcium Lvl 9.1 mg/dL     Protein Total 7.6 g/dL     Albumin Lvl 4.8 g/dL     Globulin Calc 2.8 g/dL     AG Ratio Calc 8.28 mmol/L     Alk Phos 102 unit/L     AST 22 unit/L     ALT 13 unit/L     eGFR AA 146 mL/min/1.55m     eGFR Non-AA 126 mL/min/1.65m     Bili Total 1.20 mg/dL    11/11/7977 1:64 EDT Environmental Safety Implemented Bed in low position, Wheels locked, Call device within reach     Patient Status Rounding Patient ID checked, Denies any needs    06/09/2019 8:34 EDT  Respirations Unlabored     Respiratory Pattern Regular     Patient Airway Status Patent without support     Skin Color General Usual for ethnicity     Skin Temperature Warm     Skin Moisture General Dry     Elbow Left Posterior      Musculoskeletal Abnormality: Enlarged/swelling     Musculoskeletal Symptoms: Pain at rest, Pain with movement     Musculoskeletal Range of Motion: Limited motion, active     Back      Musculoskeletal Abnormality: No abnormalities     Musculoskeletal Symptoms: Pain at rest, Pain with movement     Level of Consciousness - APVU Alert     Affect/Behavior Appropriate     Orientation Assessment Oriented x 4  ED RN Progress Note L elbow pain, back and head pain, Pt also c/o generalized pain    06/09/2019 8:33 EDT Oxygen Therapy Room air    06/09/2019 8:32 EDT Hx of falling last 3 months ED Fall Yes (Single mechanical fall)     Patient confused of disoriented ED Fall No     Patient intoxicated or sedated ED Fall No     Patient have an impaired gait ED Fall No     Use a mobility assistance device ED Fall No     Patient altered elimination ED Fall No     [facility] ED Fall Score 1     Advanced Directives No     Barriers to Learning None evident    06/09/2019 8:30 EDT Estimated Creatinine Clearance 109.25 mL/min     Body Mass Index est meas 23.09 kg/m2     Body Mass Index Measured 23.09 kg/m2    06/09/2019 8:28 EDT Height/Length Measured 160 cm     Weight Dosing 59.1 kg     Systolic Blood Pressure 122 mmHg     Diastolic Blood Pressure 76 mmHg     Temperature Oral 37.0 degC     Heart Rate Monitored 88 bpm     Respiratory Rate 22 br/min  HI     SpO2 100 %     Numeric Rating Pain Scale 10 = Worst possible pain     Oxygen Therapy Room air     Pregnancy Status Patient denies     Last 3 mo, thoughts killing self/others Patient denies     SIRS Possible Source of Infectionn No     SIRS Source of Infection None of the above     ED Behavioral Activity Rating Scale 4 - Quiet and awake (normal level of activity)      Recent Travel History ID No recent travel     Close Contact with COVID-19  ID No     Last 14 days COVID-19 ID No    .   Radiology results:  Rad Results (ST)   CT Head or Brain w/o Contrast  ?  06/09/19 09:22:28  CT head WITHOUT: 06/09/19    COMPARISON: None    INDICATION: Head trauma, headache    TECHNIQUE: Helical imaging with axial reconstruction. CT low radiation dose  techniques were applied when appropriate according to department protocol.    FINDINGS: No hemorrhage mass effect or extra-axial fluid collection is apparent.  No ventricular dilatation is evident. No acute bony abnormality is demonstrated  and the visualized sinuses are clear.    IMPRESSION:  No acute intracranial abnormality.  ?  Signed By: JEANEEN GORDY NOVAK  ?  **************************************************  XR Spine Thoracic 3 Views  ?  06/09/19 10:32:00  Thoracic spine, 3 projections: 06/09/19    INDICATION:Fracture. Sickle cell pain, fall    COMPARISON: Chest radiograph 05/17/2019    FINDINGS: Slight superior and inferior central endplate depression, consistent  with history of sickle cell. Otherwise, the vertebral body heights are  well-maintained. The disc spaces are relatively well-maintained. No significant  degenerative findings. Chest port catheter tip overlies the superior vena cava.  Surgical clips overlie the lower chest and upper abdomen. The cardiac silhouette  is enlarged, unchanged.    IMPRESSION:  Osseous sequela of sickle cell without evidence of acute radiographic  abnormality of the thoracic spine.  ?  Signed By: GENNY BLUNT  ?  **************************************************  XR Elbow 2 Views Left  ?  06/09/19 10:32:40  INDICATION:  Injury, elbow to wrist    COMPARISON: None.    2 radiographic views of the LEFT elbow, 06/09/19.    FINDINGS:  No displaced acute fracture, dislocation or radiopaque foreign body.    IMPRESSION:  No acute abnormality.    If you have questions regarding this report, please feel free  to call me  directly using [employer].  ?  Signed By: RANDALL NED  ?  **************************************************  XR Chest 1 View Portable  ?  06/09/19 10:30:06  Chest AP: 06/09/19    INDICATION:Chest pain. Sickle cell    COMPARISON: 05/17/2019    FINDINGS: The cardiomediastinal silhouette is enlarged, unchanged. Accessed  right chest port catheter tip overlies the superior vena cava. Unchanged areas  of scarring in the lung bases. No new focal consolidations. Surgical clips  overlie the upper abdomen. No pneumothorax or pleural thickening. There are no  acute osseous abnormalities.    IMPRESSION:  No new focal airspace opacities. Stable cardiomegaly and scattered areas of  scarring.  ?  Signed By: GENNY BLUNT  .      Reexamination/ Reevaluation   Vital signs   Basic Oxygen Information   06/09/2019 8:33 EDT Oxygen Therapy Room air   06/09/2019 8:28 EDT SpO2 100 %    Oxygen Therapy Room air      Notes: Trauma workup negative.    Patient has significant acidosis. Given NS bolus. Repeated and dropped to 13. I am concerned about dehydration and infection especially in the setting of vasoocclusive crisis.    I recommended admission for the acidosis. Patient has politely refused. She wants to sign out AMA. Discussed risk of infection and dehydration. I have left the door open for her to return at any time. .      Impression and Plan   Diagnosis   Acidosis (ICD10-CM E87.2, Discharge, Medical)   Dehydration (ICD10-CM E86.0, Discharge, Medical)   Vasoocclusive sickle cell crisis (ICD10-CM D57.00, Discharge, Medical)   Head injury (ICD10-CM S09.90XA, Discharge, Medical)   Back strain (ICD10-CM S39.012A, Discharge, Medical)   Plan   Condition: Stable.    Disposition: Discharged: to home.    Patient was given the following educational materials: Dehydration, Adult, Sickle Cell Anemia, Adult, Sickle Cell Anemia, Adult, Dehydration, Adult.    Limitations: Limited activity.    Follow up with: Return to Emergency  Department if you have any new or worsening symptoms, Return to Emergency Department, In: as needed.    Counseled: Patient.

## 2019-06-09 NOTE — ED Notes (Signed)
ED Note-Nursing       ED RN Reassessment Entered On:  06/09/2019 13:42 EDT    Performed On:  06/09/2019 13:41 EDT by Chestine Spore, RN, Georgiann Mohs               ED RN Reassessment   ED RN Progress Note :   Discontinued access port prior to leaving. flushed with heparin prior to removing needle   Idamae Schuller - 06/09/2019 13:41 EDT

## 2019-06-09 NOTE — ED Notes (Signed)
 ED Patient Education Note     Patient Education Materials Follows:  Home Health Care     Sickle Cell Anemia, Adult    Sickle cell anemia is a condition in which red blood cells have an abnormal sickle shape. This abnormal shape shortens the cells' life span, which results in a lower than normal concentration of red blood cells in the blood. The sickle shape also causes the cells to clump together and block free blood flow through the blood vessels. As a result, the tissues and organs of the body do not receive enough oxygen. Sickle cell anemia causes organ damage and pain and increases the risk of infection.      CAUSES    Sickle cell anemia is a genetic disorder. Those who receive two copies of the gene have the condition, and those who receive one copy have the trait.    RISK FACTORS    The sickle cell gene is most common in people whose families originated in [place]. Other areas of the globe where sickle cell trait occurs include the [place], [place] and [place], the [place], and the [place].     SIGNS AND SYMPTOMS     Pain, especially in the extremities, back, chest, or abdomen (common). The pain may start suddenly or may develop following an illness, especially if there is dehydration. Pain can also occur due to overexertion or exposure to extreme temperature changes.     Frequent severe bacterial infections, especially certain types of pneumonia and meningitis.     Pain and swelling in the hands and feet.     Decreased activity. ?     Loss of appetite. ?     Change in behavior.      Headaches.     Seizures.     Shortness of breath or difficulty breathing.     Vision changes.     Skin ulcers.    Those with the trait may not have symptoms or they may have mild symptoms.     DIAGNOSIS    Sickle cell anemia is diagnosed with blood tests that demonstrate the genetic trait. It is often diagnosed during the newborn period, due to mandatory testing nationwide. A variety of blood tests, X-rays, CT  scans, MRI scans, ultrasounds, and lung function tests may also be done to monitor the condition.    TREATMENT    Sickle cell anemia may be treated with:     Medicines. You may be given pain medicines, antibiotic medicines (to treat and prevent infections) or medicines to increase the production of certain types of hemoglobin.     Fluids.     Oxygen.     Blood transfusions.    HOME CARE INSTRUCTIONS     Drink enough fluid to keep your urine clear or pale yellow. Increase your fluid intake in hot weather and during exercise.      Do not smoke. Smoking lowers oxygen levels in the blood. ?     Only take over-the-counter or prescription medicines for pain, fever, or discomfort as directed by your health care provider.     Take antibiotics as directed by your health care provider. Make sure you finish them it even if you start to feel better. ?     Take supplements as directed by your health care provider. ?     Consider wearing a medical alert bracelet. This tells anyone caring for you in an emergency of your condition. ?     When traveling,  keep your medical information, health care provider's names, and the medicines you take with you at all times. ?     If you develop a fever, do not take medicines to reduce the fever right away. This could cover up a problem that is developing. Notify your health care provider.      Keep all follow-up appointments with your health care provider. Sickle cell anemia requires regular medical care.     SEEK MEDICAL CARE IF:    ?You have a fever.    SEEK IMMEDIATE MEDICAL CARE IF:     You feel dizzy or faint. ?     You have new abdominal pain, especially on the left side near the stomach area. ?     You develop a persistent, often uncomfortable and painful penile erection (priapism). If this is not treated immediately it will lead to impotence. ?     You have numbness your arms or legs or you have a hard time moving them. ?     You have a hard time with speech. ?     You have a fever or  persistent symptoms for more than 2?3 days. ?     You have a fever and your symptoms suddenly get worse. ?     You have signs or symptoms of infection. These include: ?    ? Chills. ?    ? Abnormal tiredness (lethargy). ?    ? Irritability. ?    ? Poor eating. ?    ? Vomiting. ?     You develop pain that is not helped with medicine. ?     You develop shortness of breath.     You have pain in your chest. ?     You are coughing up pus-like or bloody sputum. ?     You develop a stiff neck.     Your feet or hands swell or have pain.     Your abdomen appears bloated.     You develop joint pain.    MAKE SURE YOU:     Understand these instructions.    This information is not intended to replace advice given to you by your health care provider. Make sure you discuss any questions you have with your health care provider.    Document Released: 01/31/2006 Document Revised: 11/13/2014 Document Reviewed: 06/04/2013  Elsevier Interactive Patient Education ?2016 Elsevier Inc.      Nutrition     Dehydration, Adult    Dehydration is a condition in which you do not have enough fluid or water in your body. It happens when you take in less fluid than you lose. Vital organs such as the kidneys, brain, and heart cannot function without a proper amount of fluids. Any loss of fluids from the body can cause dehydration.     Dehydration can range from mild to severe. This condition should be treated right away to help prevent it from becoming severe.      CAUSES    This condition may be caused by:     Vomiting.     Diarrhea.     Excessive sweating, such as when exercising in hot or humid weather.     Not drinking enough fluid during strenuous exercise or during an illness.     Excessive urine output.     Fever.      Certain medicines.    RISK FACTORS    This condition is more likely to develop in:  People who are taking certain medicines that cause the body to lose excess fluid (diuretics). ?     People who have a chronic illness, such as  diabetes, that may increase urination.      Older adults. ?     People who live at high altitudes. ?     People who participate in endurance sports. ?    SYMPTOMS    Mild Dehydration     Thirst.     Dry lips.     Slightly dry mouth.     Dry, warm skin.     Moderate Dehydration     Very dry mouth. ?     Muscle cramps. ?     Dark urine and decreased urine production. ?     Decreased tear production. ?     Headache. ?     Light-headedness, especially when you stand up from a sitting position. ?    Severe Dehydration     Changes in skin. ?    ? Cold and clammy skin. ?    ? Skin does not spring back quickly when lightly pinched and released. ?     Changes in body fluids. ?    ? Extreme thirst. ?    ? No tears. ?    ? Not able to sweat when body temperature is high, such as in hot weather. ?    ? Minimal urine production. ?     Changes in vital signs. ?    ? Rapid, weak pulse (more than 100 beats per minute when you are sitting still). ?    ? Rapid breathing. ?    ? Low blood pressure. ?     Other changes. ?    ? Sunken eyes. ?    ? Cold hands and feet. ?    ? Confusion.     ? Lethargy and difficulty being awakened.     ? Fainting (syncope). ?    ? Short-term weight loss. ?    ? Unconsciousness.     DIAGNOSIS    This condition may be diagnosed based on your symptoms. You may also have tests to determine how severe your dehydration is. These tests may include:      Urine tests. ?     Blood tests. ?    TREATMENT    Treatment for this condition depends on the severity. Mild or moderate dehydration can often be treated at home. Treatment should be started right away. Do not wait until dehydration becomes severe. Severe dehydration needs to be treated at the hospital.    Treatment for Mild Dehydration     Drinking plenty of water to replace the fluid you have lost. ?     Replacing minerals in your blood (electrolytes) that you may have lost. ?    Treatment for Moderate Dehydration?     Consuming oral rehydration solution  (ORS).     Treatment for Severe Dehydration     Receiving fluid through an IV tube. ?     Receiving electrolyte solution through a feeding tube that is passed through your nose and into your stomach (nasogastric tube or NG tube).     Correcting any abnormalities in electrolytes.     HOME CARE INSTRUCTIONS     Drink enough fluid to keep your urine clear or pale yellow. ?     Drink water or fluid slowly by taking small sips. You can also try sucking on ice cubes.?  Have food or beverages that contain electrolytes. Examples include bananas and sports drinks.     Take over-the-counter and prescription medicines only as told by your health care provider. ?     Prepare ORS according to the manufacturer's instructions. Take sips of ORS every 5 minutes until your urine returns to normal.     If you have vomiting or diarrhea, continue to try to drink water, ORS, or both. ?     If you have diarrhea, avoid: ?    ? Beverages that contain caffeine. ?    ? Fruit juice. ?    ? Milk. ??    ? Carbonated soft drinks.     Do not take salt tablets. This can lead to the condition of having too much sodium in your body (hypernatremia). ?    SEEK MEDICAL CARE IF:     You cannot eat or drink without vomiting.      You have had moderate diarrhea during a period of more than 24 hours.      You have a fever.    SEEK IMMEDIATE MEDICAL CARE IF:     You have extreme thirst.     You have severe diarrhea.      You have not urinated in 6?8 hours, or you have urinated only a small amount of very dark urine.     You have shriveled skin.     You are dizzy, confused, or both.    This information is not intended to replace advice given to you by your health care provider. Make sure you discuss any questions you have with your health care provider.    Document Released: 10/23/2005 Document Revised: 07/14/2015 Document Reviewed: 03/10/2015  Elsevier Interactive Patient Education ?2016 Elsevier Inc.

## 2019-06-09 NOTE — ED Notes (Signed)
ED Triage Note       ED Triage Adult Entered On:  06/09/2019 8:30 EDT    Performed On:  06/09/2019 8:28 EDT by Hassell Done RN, [facility]               Triage   Chief Complaint :   Pt c/o pain from sickle cell. Reports fall prior to arrival down 5 steps. Sliding down on back. Swelling noted to left arm/elbow and back pain.    Numeric Rating Pain Scale :   10 = Worst possible pain   [place] Mode of Arrival :   Private vehicle   Infectious Disease Documentation :   Document assessment   Temperature Oral :   37.0 degC(Converted to: 98.6 degF)    Heart Rate Monitored :   88 bpm   Respiratory Rate :   22 br/min (HI)    Systolic Blood Pressure :   981 mmHg   Diastolic Blood Pressure :   76 mmHg   SpO2 :   100 %   Oxygen Therapy :   Room air   Patient presentation :   None of the above   Chief Complaint or Presentation suggest infection :   No   Weight Dosing :   59.1 kg(Converted to: 130 lb 5 oz)    Height :   160 cm(Converted to: 5 ft 3 in)    Body Mass Index Dosing :   23 kg/m2   Gasper Sells - 06/09/2019 8:28 EDT   DCP GENERIC CODE   Tracking Acuity :   3   Tracking Group :   ED [id number] Tracking [employer]   [place], RNErlene Quan - 06/09/2019 8:28 EDT   ED General Section :   Document assessment   Pregnancy Status :   Patient denies   ED Allergies Section :   Document assessment   ED Reason for Visit Section :   Document assessment   Gasper Sells - 06/09/2019 8:28 EDT   ID Risk Screen Symptoms   Recent Travel History :   No recent travel   Close Contact with COVID-19 ID :   No   Last 14 days COVID-19 ID :   No   Hassell Done, RN, BRANDON - 06/09/2019 8:28 EDT   Allergies   (As Of: 06/09/2019 08:30:50 EDT)   Allergies (Active)   Bactrim  Estimated Onset Date:   Unspecified ; Reactions:   Itching ; Created By:   Thedora Hinders; Reaction Status:   Active ; Category:   Drug ; Substance:   Bactrim ; Type:   Allergy ; Severity:   Moderate ; Updated By:   Thedora Hinders; Reviewed Date:   06/09/2019 8:30 EDT      contrast media  (iodine-based)  Estimated Onset Date:   Unspecified ; Created ByOkey Dupre, RN, JENNETTE; Reaction Status:   Active ; Category:   Drug ; Substance:   contrast media (iodine-based) ; Type:   Allergy ; Severity:   Unknown ; Updated By:   Recardo Evangelist; Reviewed Date:   06/09/2019 8:30 EDT      fentaNYL  Estimated Onset Date:   Unspecified ; Created By:   Okey Dupre RN, JENNETTE; Reaction Status:   Active ; Category:   Drug ; Substance:   fentaNYL ; Type:   Allergy ; Severity:   Unknown ; Updated By:   Recardo Evangelist; Reviewed Date:   06/09/2019 8:30 EDT  morphine  Estimated Onset Date:   Unspecified ; Created By:   Rock NephewALBAYALDE, RN, JENNETTE; Reaction Status:   Active ; Category:   Drug ; Substance:   morphine ; Type:   Allergy ; Severity:   Unknown ; Updated By:   Irene LimboALBAYALDE, RN, JENNETTE; Reviewed Date:   06/09/2019 8:30 EDT      Rocephin  Estimated Onset Date:   Unspecified ; Created By:   Rock NephewALBAYALDE, RN, JENNETTE; Reaction Status:   Active ; Category:   Drug ; Substance:   Rocephin ; Type:   Allergy ; Severity:   Unknown ; Updated By:   Irene LimboALBAYALDE, RN, JENNETTE; Reviewed Date:   06/09/2019 8:30 EDT        Psycho-Social   Last 3 mo, thoughts killing self/others :   Patient denies   ED Behavioral Activity Rating Scale :   4 - Quiet and awake (normal level of activity)   Daphine DeutscherMARTIN, RApolinar Junes, BRANDON - 06/09/2019 8:28 EDT   ED Reason for Visit   (As Of: 06/09/2019 08:30:50 EDT)   Problems(Active)    Alteration in comfort: pain (SNOMED CT  :[id number] )  Name of Problem:   Alteration in comfort: pain ; Recorder:   SYSTEM,  SYSTEM; Confirmation:   Confirmed ; Classification:   Interdisciplinary ; Code:   [id number] ; Last Updated:   03/28/2019 2:23 EDT ; Life Cycle Date:   03/28/2019 ; Life Cycle Status:   Active ; Vocabulary:   SNOMED CT   ; Comments:        03/28/2019 2:23 - SYSTEM,  SYSTEM  Problem added automatically by system based on initiation of Alteration in Comfort Plan of Care      Hypertension (IMO  :[id number] )  Name  of Problem:   Hypertension ; Recorder:   Rock NephewALBAYALDE, RN, JENNETTE; Confirmation:   Confirmed ; Classification:   Medical ; Code:   [phone] ; Contributor System:   DietitianowerChart ; Last Updated:   11/08/2015 5:36 EST ; Life Cycle Date:   11/08/2015 ; Life Cycle Status:   Active ; Vocabulary:   IMO        Shortness of breath (IMO  :[id number] )  Name of Problem:   Shortness of breath ; Recorder:   SYSTEM,  SYSTEM; Confirmation:   Confirmed ; Classification:   Patient Stated ; Code:   78469:   [id number] ; Last Updated:   04/17/2019 15:58 EDT ; Life Cycle Date:   04/17/2019 ; Life Cycle Status:   Active ; Vocabulary:   IMO        Sickle cell (SNOMED CT  :[id number] )  Name of Problem:   Sickle cell ; Recorder:   ALBAYALDE, RN, JENNETTE; Confirmation:   Confirmed ; Classification:   Medical ; Code:   [id number] ; Contributor System:   DietitianowerChart ; Last Updated:   11/08/2015 5:36 EST ; Life Cycle Date:   11/08/2015 ; Life Cycle Status:   Active ; Vocabulary:   SNOMED CT          Diagnoses(Active)    Sickle cell pain/fall  Date:   06/09/2019 ; Diagnosis Type:   Reason For Visit ; Confirmation:   Complaint of ; Clinical Dx:   Sickle cell pain/fall ; Classification:   Medical ; Clinical Service:   Emergency medicine ; Code:   PNED ; Probability:   0 ; Diagnosis Code:   [id number]

## 2019-06-09 NOTE — ED Notes (Signed)
 ED Triage Note       ED Secondary Triage Entered On:  06/09/2019 8:33 EDT    Performed On:  06/09/2019 8:32 EDT by Gretta, RN, Merlynn RAMAN               General Information   Barriers to Learning :   None evident   ED Home Meds Section :   Document assessment   [facility] ED Fall Risk Section :   Document assessment   ED History Section :   Document assessment   ED Advance Directives Section :   Document assessment   ED Palliative Screen :   N/A (prefilled for <65yo)   Gretta RN, Merlynn RAMAN - 06/09/2019 8:32 EDT   (As Of: 06/09/2019 08:33:43 EDT)   Problems(Active)    Alteration in comfort: pain (SNOMED CT  :65759985 )  Name of Problem:   Alteration in comfort: pain ; Recorder:   SYSTEM,  SYSTEM; Confirmation:   Confirmed ; Classification:   Interdisciplinary ; Code:   [id number] ; Last Updated:   03/28/2019 2:23 EDT ; Life Cycle Date:   03/28/2019 ; Life Cycle Status:   Active ; Vocabulary:   SNOMED CT   ; Comments:        03/28/2019 2:23 - SYSTEM,  SYSTEM  Problem added automatically by system based on initiation of Alteration in Comfort Plan of Care      Hypertension (IMO  :13508 )  Name of Problem:   Hypertension ; Recorder:   CHARLIES, RN, JENNETTE; Confirmation:   Confirmed ; Classification:   Medical ; Code:   [phone] ; Contributor System:   PowerChart ; Last Updated:   11/08/2015 5:36 EST ; Life Cycle Date:   11/08/2015 ; Life Cycle Status:   Active ; Vocabulary:   IMO        Shortness of breath (IMO  :72576 )  Name of Problem:   Shortness of breath ; Recorder:   SYSTEM,  SYSTEM; Confirmation:   Confirmed ; Classification:   Patient Stated ; Code:   72576 ; Last Updated:   04/17/2019 15:58 EDT ; Life Cycle Date:   04/17/2019 ; Life Cycle Status:   Active ; Vocabulary:   IMO        Sickle cell (SNOMED CT  :16827988 )  Name of Problem:   Sickle cell ; Recorder:   ALBAYALDE, RN, JENNETTE; Confirmation:   Confirmed ; Classification:   Medical ; Code:   [id number] ; Contributor System:   PowerChart ; Last Updated:   11/08/2015 5:36 EST ; Life  Cycle Date:   11/08/2015 ; Life Cycle Status:   Active ; Vocabulary:   SNOMED CT          Diagnoses(Active)    Sickle cell pain/fall  Date:   06/09/2019 ; Diagnosis Type:   Reason For Visit ; Confirmation:   Complaint of ; Clinical Dx:   Sickle cell pain/fall ; Classification:   Medical ; Clinical Service:   Emergency medicine ; Code:   PNED ; Probability:   0 ; Diagnosis Code:   [id number]             -    Procedure History   (As Of: 06/09/2019 08:33:43 EDT)     Anesthesia Minutes:   0 ; Procedure Name:   Port ; Procedure Minutes:   0            Anesthesia Minutes:   0 ; Procedure Name:   thoracotomy ; Procedure  Minutes:   0            Anesthesia Minutes:   0 ; Procedure Name:   hernia repair ; Procedure Minutes:   0            Anesthesia Minutes:   0 ; Procedure Name:   splenectomy ; Procedure Minutes:   0            Anesthesia Minutes:   0 ; Procedure Name:   Cholecystectomy; ; Procedure Minutes:   0            [facility] Fall Risk Assessment Tool   Hx of falling last 3 months ED Fall :   Yes (Single mechanical fall)   Patient confused or disoriented ED Fall :   No   Patient intoxicated or sedated ED Fall :   No   Patient impaired gait ED Fall :   No   Use a mobility assistance device ED Fall :   No   Patient altered elimination ED Fall :   No   [facility] ED Fall Score :   1    Gretta RN, Merlynn RAMAN - 06/09/2019 8:32 EDT   ED Advance Directive   Advance Directive :   No   Gretta RN, Merlynn RAMAN - 06/09/2019 8:32 EDT   Social History   Social History   (As Of: 06/09/2019 08:33:43 EDT)   Tobacco:        Former smoker   (Last Updated: 11/08/2015 05:40:04 EST by CHARLIES, RN, JENNETTE)          Alcohol:        Denies   (Last Updated: 03/27/2019 20:00:39 EDT by VENUS, RN, GAIL E)          Substance Use:        Opioid Complex, Current, Prescription medications, Marijuana   (Last Updated: 03/27/2019 20:04:05 EDT by VENUS, RN, GAIL E)            Med Hx   Medication List   (As Of: 06/09/2019 08:33:44 EDT)   Home Meds     sodium bicarbonate  :   sodium bicarbonate ; Status:   Documented ; Ordered As Mnemonic:   sodium bicarbonate 650 mg oral tablet ; Simple Display Line:   325 mg, 0.5 tabs, Oral, BID, 0 Refill(s) ; Ordering Provider:   TAMALA HINDERS; Catalog Code:   sodium bicarbonate ; Order Dt/Tm:   04/18/2019 14:27:46 EDT          cyclobenzaprine  :   cyclobenzaprine ; Status:   Documented ; Ordered As Mnemonic:   cyclobenzaprine 5 mg oral tablet ; Simple Display Line:   5 mg, 1 tabs, Oral, TID, for 14 days, 42 tabs, 0 Refill(s) ; Catalog Code:   cyclobenzaprine ; Order Dt/Tm:   04/17/2019 19:33:49 EDT ; Comment:    THIS MEDICATION IS ASSOCIATED   WITH   AN INCREASED RISK OF FALLS.          albuterol  :   albuterol ; Status:   Documented ; Ordered As Mnemonic:   Proventil HFA 90 mcg/inh inhalation aerosol ; Simple Display Line:   2 puffs, Inhale, q4hr, PRN, 6.7 g, 0 Refill(s) ; Catalog Code:   albuterol ; Order Dt/Tm:   03/28/2019 09:31:25 EDT          bisacodyl  :   bisacodyl ; Status:   Documented ; Ordered As Mnemonic:   Dulcolax Laxative 5 mg oral delayed release tablet ;  Simple Display Line:   5 mg, 1 tabs, Oral, Daily, PRN, 0 Refill(s) ; Catalog Code:   bisacodyl ; Order Dt/Tm:   03/28/2019 09:31:31 EDT ; Comment:   DO NOT CRUSH          citalopram  :   citalopram ; Status:   Documented ; Ordered As Mnemonic:   CeleXA 40 mg oral tablet ; Simple Display Line:   40 mg, 1 tabs, Oral, Daily, 30 tabs, 0 Refill(s) ; Catalog Code:   citalopram ; Order Dt/Tm:   03/28/2019 09:26:41 EDT ; Comment:   SOUND ALIKE / LOOK ALIKE - VERIFY DRUG          clonazePAM  :   clonazePAM ; Status:   Documented ; Ordered As Mnemonic:   KlonoPIN 0.5 mg oral tablet ; Simple Display Line:   0.5 mg, 1 tabs, Oral, Once a Day (at bedtime), 0 Refill(s) ; Catalog Code:   clonazePAM ; Order Dt/Tm:   03/28/2019 09:26:46 EDT ; Comment:   SOUND ALIKE / LOOK ALIKE - VERIFY DRUG  SOUND ALIKE / LOOK ALIKE - VERIFY DRUG          diphenhydrAMINE  :    diphenhydrAMINE ; Status:   Documented ; Ordered As Mnemonic:   Benadryl 25 mg oral tablet ; Simple Display Line:   50 mg, 2 tabs, Oral, PRN: prn, 0 Refill(s) ; Catalog Code:   diphenhydrAMINE ; Order Dt/Tm:   03/28/2019 09:31:07 EDT          docusate  :   docusate ; Status:   Documented ; Ordered As Mnemonic:   Colace 100 mg oral capsule ; Simple Display Line:   100 mg, 1 caps, Oral, BID, PRN: as needed for constipation, 0 Refill(s) ; Catalog Code:   docusate ; Order Dt/Tm:   03/28/2019 09:31:40 EDT          fentaNYL  :   fentaNYL ; Status:   Documented ; Ordered As Mnemonic:   fentaNYL 50 mcg/hr transdermal film, extended release ; Simple Display Line:   1 patches, TD, q72hr, 0 Refill(s) ; Catalog Code:   fentaNYL ; Order Dt/Tm:   03/28/2019 09:27:02 EDT          folic acid  :   folic acid ; Status:   Documented ; Ordered As Mnemonic:   folic acid 1 mg oral tablet ; Simple Display Line:   1 mg, 1 tabs, Oral, Daily, 0 Refill(s) ; Catalog Code:   folic acid ; Order Dt/Tm:   03/28/2019 90:71:74 EDT          ibuprofen  :   ibuprofen ; Status:   Documented ; Ordered As Mnemonic:   ibuprofen 800 mg oral tablet ; Simple Display Line:   800 mg, 1 tabs, Oral, TID, PRN: for pain, 30 tabs, 0 Refill(s) ; Catalog Code:   ibuprofen ; Order Dt/Tm:   03/28/2019 09:28:46 EDT          mirtazapine  :   mirtazapine ; Status:   Documented ; Ordered As Mnemonic:   Remeron 15 mg oral tablet ; Simple Display Line:   15 mg, 1 tabs, Oral, Once a Day (at bedtime), 30 tabs, 0 Refill(s) ; Catalog Code:   mirtazapine ; Order Dt/Tm:   03/28/2019 90:71:47 EDT          oxyCODONE  :   oxyCODONE ; Status:   Documented ; Ordered As Mnemonic:   oxyCODONE 30 mg oral tablet ;  Simple Display Line:   30 mg, 1 tabs, Oral, q4hr, PRN: for pain, 0 Refill(s) ; Catalog Code:   oxyCODONE ; Order Dt/Tm:   03/28/2019 09:29:01 EDT          promethazine  :   promethazine ; Status:   Documented ; Ordered As Mnemonic:   promethazine 25 mg oral tablet ; Simple Display Line:    25 mg, 1 tabs, Oral, q6hr, PRN: as needed for nausea/vomiting, 0 Refill(s) ; Catalog Code:   promethazine ; Order Dt/Tm:   03/28/2019 09:32:47 EDT

## 2019-06-09 NOTE — ED Notes (Signed)
 ED Patient Summary       ;       [facility] Emergency Department  [address] [place] [address]  [phone]  Discharge Instructions (Patient)  _______________________________________     Name: Judith [name], Judith [name]  DOB: May 06, 1986                   MRN: [id number]                   FIN: [phone]  Reason For Visit: Sickle cell pain/fall; SICKLE CELL, FALL  Final Diagnosis: Acidosis; Back strain; Dehydration; Head injury; Vasoocclusive sickle cell crisis     Visit Date: 06/09/2019 08:25:00  Address: [address] [place] [address]  Phone: [phone]     Primary Care Provider:      Name: PCP,  NONE      Phone:         Emergency Department Providers:         Primary Physician:   DITTRICH-MD, DEBBY JAYSON Shelvy Gwenn Lionel would like to thank you for allowing us  to assist you with your healthcare needs. The following includes patient education materials and information regarding your injury/illness.     Follow-up Instructions: You were treated today on an emergency basis, it may be wise to contact your primary care provider to notify them of your visit today. You may have been referred to your regular doctor or a specialist, please follow up as instructed. If your condition worsens or you can't get in to see the doctor, contact the Emergency Department.              With: Address: When:   Return to Emergency Department     Comments:   if you have any new or worsening symptoms              Printed Prescriptions:    Patient Education Materials:  Discharge Orders          Discharge Patient 06/09/19 13:31:00 EDT         Comment:      Sickle Cell Anemia, Adult; Dehydration, Adult     Sickle Cell Anemia, Adult    Sickle cell anemia is a condition in which red blood cells have an abnormal sickle shape. This abnormal shape shortens the cells' life span, which results in a lower than normal concentration of red blood cells in the blood. The sickle shape also causes the cells to clump  together and block free blood flow through the blood vessels. As a result, the tissues and organs of the body do not receive enough oxygen. Sickle cell anemia causes organ damage and pain and increases the risk of infection.      CAUSES    Sickle cell anemia is a genetic disorder. Those who receive two copies of the gene have the condition, and those who receive one copy have the trait.    RISK FACTORS    The sickle cell gene is most common in people whose families originated in [place]. Other areas of the globe where sickle cell trait occurs include the [place], [place], the [place], and the [place].     SIGNS AND SYMPTOMS     Pain, especially in the extremities, back, chest, or abdomen (common). The pain may start suddenly or may develop following an illness, especially if there is dehydration. Pain can also occur due to overexertion  or exposure to extreme temperature changes.     Frequent severe bacterial infections, especially certain types of pneumonia and meningitis.     Pain and swelling in the hands and feet.     Decreased activity. ?     Loss of appetite. ?     Change in behavior.      Headaches.     Seizures.     Shortness of breath or difficulty breathing.     Vision changes.     Skin ulcers.    Those with the trait may not have symptoms or they may have mild symptoms.     DIAGNOSIS    Sickle cell anemia is diagnosed with blood tests that demonstrate the genetic trait. It is often diagnosed during the newborn period, due to mandatory testing nationwide. A variety of blood tests, X-rays, CT scans, MRI scans, ultrasounds, and lung function tests may also be done to monitor the condition.    TREATMENT    Sickle cell anemia may be treated with:     Medicines. You may be given pain medicines, antibiotic medicines (to treat and prevent infections) or medicines to increase the production of certain types of hemoglobin.     Fluids.     Oxygen.     Blood transfusions.    HOME CARE  INSTRUCTIONS     Drink enough fluid to keep your urine clear or pale yellow. Increase your fluid intake in hot weather and during exercise.      Do not smoke. Smoking lowers oxygen levels in the blood. ?     Only take over-the-counter or prescription medicines for pain, fever, or discomfort as directed by your health care provider.     Take antibiotics as directed by your health care provider. Make sure you finish them it even if you start to feel better. ?     Take supplements as directed by your health care provider. ?     Consider wearing a medical alert bracelet. This tells anyone caring for you in an emergency of your condition. ?     When traveling, keep your medical information, health care provider's names, and the medicines you take with you at all times. ?     If you develop a fever, do not take medicines to reduce the fever right away. This could cover up a problem that is developing. Notify your health care provider.      Keep all follow-up appointments with your health care provider. Sickle cell anemia requires regular medical care.     SEEK MEDICAL CARE IF:    ?You have a fever.    SEEK IMMEDIATE MEDICAL CARE IF:     You feel dizzy or faint. ?     You have new abdominal pain, especially on the left side near the stomach area. ?     You develop a persistent, often uncomfortable and painful penile erection (priapism). If this is not treated immediately it will lead to impotence. ?     You have numbness your arms or legs or you have a hard time moving them. ?     You have a hard time with speech. ?     You have a fever or persistent symptoms for more than 2?3 days. ?     You have a fever and your symptoms suddenly get worse. ?     You have signs or symptoms of infection. These include: ?    ? Chills. ?    ?  Abnormal tiredness (lethargy). ?    ? Irritability. ?    ? Poor eating. ?    ? Vomiting. ?     You develop pain that is not helped with medicine. ?     You develop shortness of breath.     You have  pain in your chest. ?     You are coughing up pus-like or bloody sputum. ?     You develop a stiff neck.     Your feet or hands swell or have pain.     Your abdomen appears bloated.     You develop joint pain.    MAKE SURE YOU:     Understand these instructions.    This information is not intended to replace advice given to you by your health care provider. Make sure you discuss any questions you have with your health care provider.    Document Released: 01/31/2006 Document Revised: 11/13/2014 Document Reviewed: 06/04/2013  Elsevier Interactive Patient Education ?2016 Elsevier Inc.       Dehydration, Adult    Dehydration is a condition in which you do not have enough fluid or water in your body. It happens when you take in less fluid than you lose. Vital organs such as the kidneys, brain, and heart cannot function without a proper amount of fluids. Any loss of fluids from the body can cause dehydration.     Dehydration can range from mild to severe. This condition should be treated right away to help prevent it from becoming severe.      CAUSES    This condition may be caused by:     Vomiting.     Diarrhea.     Excessive sweating, such as when exercising in hot or humid weather.     Not drinking enough fluid during strenuous exercise or during an illness.     Excessive urine output.     Fever.      Certain medicines.    RISK FACTORS    This condition is more likely to develop in:     People who are taking certain medicines that cause the body to lose excess fluid (diuretics). ?     People who have a chronic illness, such as diabetes, that may increase urination.      Older adults. ?     People who live at high altitudes. ?     People who participate in endurance sports. ?    SYMPTOMS    Mild Dehydration     Thirst.     Dry lips.     Slightly dry mouth.     Dry, warm skin.     Moderate Dehydration     Very dry mouth. ?     Muscle cramps. ?     Dark urine and decreased urine production. ?     Decreased tear  production. ?     Headache. ?     Light-headedness, especially when you stand up from a sitting position. ?    Severe Dehydration     Changes in skin. ?    ? Cold and clammy skin. ?    ? Skin does not spring back quickly when lightly pinched and released. ?     Changes in body fluids. ?    ? Extreme thirst. ?    ? No tears. ?    ? Not able to sweat when body temperature is high, such as in hot weather. ?    ?  Minimal urine production. ?     Changes in vital signs. ?    ? Rapid, weak pulse (more than 100 beats per minute when you are sitting still). ?    ? Rapid breathing. ?    ? Low blood pressure. ?     Other changes. ?    ? Sunken eyes. ?    ? Cold hands and feet. ?    ? Confusion.     ? Lethargy and difficulty being awakened.     ? Fainting (syncope). ?    ? Short-term weight loss. ?    ? Unconsciousness.     DIAGNOSIS    This condition may be diagnosed based on your symptoms. You may also have tests to determine how severe your dehydration is. These tests may include:      Urine tests. ?     Blood tests. ?    TREATMENT    Treatment for this condition depends on the severity. Mild or moderate dehydration can often be treated at home. Treatment should be started right away. Do not wait until dehydration becomes severe. Severe dehydration needs to be treated at the hospital.    Treatment for Mild Dehydration     Drinking plenty of water to replace the fluid you have lost. ?     Replacing minerals in your blood (electrolytes) that you may have lost. ?    Treatment for Moderate Dehydration?     Consuming oral rehydration solution (ORS).     Treatment for Severe Dehydration     Receiving fluid through an IV tube. ?     Receiving electrolyte solution through a feeding tube that is passed through your nose and into your stomach (nasogastric tube or NG tube).     Correcting any abnormalities in electrolytes.     HOME CARE INSTRUCTIONS     Drink enough fluid to keep your urine clear or pale yellow. ?     Drink water or  fluid slowly by taking small sips. You can also try sucking on ice cubes.?     Have food or beverages that contain electrolytes. Examples include bananas and sports drinks.     Take over-the-counter and prescription medicines only as told by your health care provider. ?     Prepare ORS according to the manufacturer's instructions. Take sips of ORS every 5 minutes until your urine returns to normal.     If you have vomiting or diarrhea, continue to try to drink water, ORS, or both. ?     If you have diarrhea, avoid: ?    ? Beverages that contain caffeine. ?    ? Fruit juice. ?    ? Milk. ??    ? Carbonated soft drinks.     Do not take salt tablets. This can lead to the condition of having too much sodium in your body (hypernatremia). ?    SEEK MEDICAL CARE IF:     You cannot eat or drink without vomiting.      You have had moderate diarrhea during a period of more than 24 hours.      You have a fever.    SEEK IMMEDIATE MEDICAL CARE IF:     You have extreme thirst.     You have severe diarrhea.      You have not urinated in 6?8 hours, or you have urinated only a small amount of very dark urine.     You have shriveled skin.  You are dizzy, confused, or both.    This information is not intended to replace advice given to you by your health care provider. Make sure you discuss any questions you have with your health care provider.    Document Released: 10/23/2005 Document Revised: 07/14/2015 Document Reviewed: 03/10/2015  Elsevier Interactive Patient Education ?2016 Elsevier Inc.         Allergy Info: fentaNYL; contrast media (iodine-based); Rocephin; Bactrim; morphine     Medication Information:  [facility] ED Physicians provided you with a complete list of medications post discharge, if you have been instructed to stop taking a medication please ensure you also follow up with this information to your Primary Care Physician.  Unless otherwise noted, patient will continue to take medications as prescribed  prior to the Emergency Room visit.  Any specific questions regarding your chronic medications and dosages should be discussed with your physician(s) and pharmacist.          albuterol (Proventil HFA 90 mcg/inh inhalation aerosol) 2 Puffs Inhale (breathe in) every 4 hours as needed.  bisacodyl (Dulcolax Laxative 5 mg oral delayed release tablet) 1 Tabs Oral (given by mouth) every day as needed., DO NOT CRUSH  citalopram (CeleXA 40 mg oral tablet) 1 Tabs Oral (given by mouth) every day., SOUND ALIKE / LOOK ALIKE - VERIFY DRUG  clonazePAM (KlonoPIN 0.5 mg oral tablet) 1 Tabs Oral (given by mouth) Once a Day (at bedtime)., SOUND ALIKE / LOOK ALIKE - VERIFY DRUG   SOUND ALIKE / LOOK ALIKE - VERIFY DRUG  cyclobenzaprine (cyclobenzaprine 5 mg oral tablet) 1 Tabs Oral (given by mouth) 3 times a day for 14 Days.,  THIS MEDICATION IS ASSOCIATED  WITH  AN INCREASED RISK OF FALLS.  diphenhydrAMINE (Benadryl 25 mg oral tablet) 2 Tabs Oral (given by mouth) as needed prn.  docusate (Colace 100 mg oral capsule) 1 Capsules Oral (given by mouth) 2 times a day as needed as needed for constipation.  fentaNYL (fentaNYL 50 mcg/hr transdermal film, extended release) 1 Patches Transdermal (apply on the skin) every 72 hours.  folic acid (folic acid 1 mg oral tablet) 1 Tabs Oral (given by mouth) every day.  ibuprofen (ibuprofen 800 mg oral tablet) 1 Tabs Oral (given by mouth) 3 times a day as needed for pain.  mirtazapine (Remeron 15 mg oral tablet) 1 Tabs Oral (given by mouth) Once a Day (at bedtime).  oxyCODONE (oxyCODONE 30 mg oral tablet) 1 Tabs Oral (given by mouth) every 4 hours as needed for pain.  promethazine (promethazine 25 mg oral tablet) 1 Tabs Oral (given by mouth) every 6 hours as needed as needed for nausea/vomiting.  sodium bicarbonate (sodium bicarbonate 650 mg oral tablet) 0.5 Tabs Oral (given by mouth) 2 times a day.      Medications Administered During Visit:              Medication Dose Route   hydromorphone 1 mg IV  Push   diphenhydrAMINE 25 mg IV Push   promethazine 12.5 mg IV Push   acetaminophen 1000 mg Oral   hydromorphone 1 mg IV Push   promethazine 12.5 mg IV Push   diphenhydrAMINE 12.5 mg IV Push   Sodium Chloride 0.9% 1000 mL IV Piggyback   potassium chloride 40 mEq Oral   Sodium Chloride 0.9% 1000 mL IV Piggyback   heparin flush 500 units Intracath          Major Tests and Procedures:  The following procedures and tests were  performed during your ED visit.  COMMON PROCEDURES%>  COMMON PROCEDURES COMMENTS%>          Laboratory Orders  Name Status Details   BMP Completed Blood, Stat, ST - Stat, 06/09/19 11:38:00 EDT, 06/09/19 11:38:00 EDT, Nurse collect, DITTRICH-MD,  DEBBY C, Print label Y/N   CBC Completed Blood, Stat, ST - Stat, 06/09/19 8:40:00 EDT, 06/09/19 8:40:00 EDT, Nurse collect, DITTRICH-MD,  DEBBY C, Print label Y/N   CMP Completed Blood, Stat, ST - Stat, 06/09/19 8:40:00 EDT, 06/09/19 8:40:00 EDT, Nurse collect, DITTRICH-MD,  DEBBY C, Print label Y/N   Diff Man Completed Blood, Stat, ST - Stat, 06/09/19 8:40:00 EDT, 06/09/19 8:40:00 EDT, Nurse collect, DITTRICH-MD,  DEBBY C, Print label Y/N   Retic Auto Completed Blood, Stat, ST - Stat, 06/09/19 8:40:00 EDT, 06/09/19 8:40:00 EDT, Nurse collect, DITTRICH-MD,  DEBBY BROCKS, Print label Y/N   Irwin Carbon Completed Blood, Stat, ST - Stat, Collected, 06/09/19 8:59:00 EDT [id number], 06/09/19 8:59:00 EDT, Nurse collect, Venous Draw, 06/09/19 9:02:00 EDT, SF CP Login, DITTRICH-MD,  THOMAS C, Print label Y/N, sf_lab_accession_3, sf_lab_accession_3, 1.8 mL Aolz/*[id number]...   XTube SST Completed Blood, Stat, ST - Stat, Collected, 06/09/19 8:59:00 EDT [id number], 06/09/19 8:59:00 EDT, Nurse collect, Venous Draw, 06/09/19 9:02:00 EDT, SF CP Login, DITTRICH-MD,  THOMAS C, Print label Y/N, sf_lab_accession_3, sf_lab_accession_3, 4 mL DDU/*[id number]*...               Radiology Orders  Name Status Details   CT Head or Brain w/o Contrast Completed 06/09/19 9:09:00 EDT, STAT 1 hour  or less, Reason: Head trauma, headache, Transport Mode: STRETCHER   XR Chest 1 View Portable Completed 06/09/19 9:06:00 EDT, STAT 1 hour or less, Reason: Chest pain, Transport Mode: Portable, pp_set_radiology_subspecialty   XR Elbow 2 Views Left Completed 06/09/19 8:40:00 EDT, STAT 1 hour or less, Reason: Injury, elbow to wrist, Transport Mode: STRETCHER, pp_set_radiology_subspecialty   XR Spine Thoracic 3 Views Completed 06/09/19 8:40:00 EDT, STAT 1 hour or less, Reason: Fracture, Transport Mode: STRETCHER, pp_set_radiology_subspecialty               Patient Care Orders  Name Status Details   Discharge Patient Ordered 06/09/19 13:31:00 EDT   ED Assessment Adult Completed 06/09/19 8:30:51 EDT, 06/09/19 8:30:51 EDT   ED Secondary Triage Completed 06/09/19 8:30:51 EDT, 06/09/19 8:30:51 EDT   ED Triage Adult Completed 06/09/19 8:25:22 EDT, 06/09/19 8:25:22 EDT   May Access Port Completed 06/09/19 8:48:00 EDT, This message can only be seen by Nursing, it is not visible to Pharmacy, Laboratory, or Radiology., 06/09/19 8:48:00 EDT, 06/09/19 8:48:00 EDT, Once   Patient Specific Fall Safety Measures Completed 06/09/19 8:33:44 EDT, Once, 06/09/19 8:33:44 EDT, ED Low Fall Risk Documentation       ---------------------------------------------------------------------------------------------------------------------  Florie Shelvy [facility]) encourages you to self-enroll in the [facility] Patient Portal.  [facility] Patient Portal will allow you to manage your personal health information securely from your own electronic device now and in the future.  To begin your Patient Portal enrollment process, please visit [email or web address] Click on "Sign up now" under [facility].  If you find that you need additional assistance on the [facility] Patient Portal or need a copy of your medical records, please call the [facility] Medical Records Office at [phone].  Comment:

## 2019-06-09 NOTE — ED Provider Notes (Signed)
Fall *ED        Patient:   Judith [name], Judith [name]            MRN: [id number]            FIN: [id number]               Age:   33 years     Sex:  Female     DOB:  1986-10-25   Associated Diagnoses:   Acidosis; Dehydration; Vasoocclusive sickle cell crisis; Head injury; Back strain   Author:   Sabatino Williard-MD,  Marijo Conception      Basic Information   Additional information: Chief Complaint from Nursing Triage Note   Chief Complaint  Chief Complaint: Pt c/o pain from sickle cell. Reports fall prior to arrival down 5 steps. Sliding down on back. Swelling noted to left arm/elbow and back pain. (06/09/19 08:28:00).      History of Present Illness   The patient presents following fall.  The onset was just prior to arrival.  The occurrence was single episode.  The fall was described as slipped, fell down  5  steps.  The location where the incident occurred was at home.  The character of symptoms is pain.  The degree at present is moderate.  The exacerbating factor is none.  The relieving factor is none.  Risk factors consist of none.  Therapy today: none.  Preceding symptoms none.  Associated symptoms: headache.  Additional history: none.  33 year old female with sickle cell anemia presents status post fall.  Patient tells me that she was already coming to the hospital because of a sickle cell pain crisis when she slipped on the top step down 5 metal stairs.  She reports occipital head injury, back pain in the thoracic part of the back, as well as left elbow pain..        Review of Systems   Constitutional symptoms:  Negative except as documented in HPI.   Skin symptoms:  Negative except as documented in HPI.   Eye symptoms:  Negative except as documented in HPI.   Respiratory symptoms:  Negative except as documented in HPI.   Cardiovascular symptoms:  Negative except as documented in HPI.   Gastrointestinal symptoms:  Negative except as documented in HPI.   Genitourinary symptoms:  Negative except as documented in HPI.   Musculoskeletal  symptoms:  Negative except as documented in HPI.   Neurologic symptoms:  Negative except as documented in HPI.   Psychiatric symptoms:  Negative except as documented in HPI.             Additional review of systems information: All other systems reviewed and otherwise negative.      Health Status   Allergies:    Allergic Reactions (Selected)  Moderate  Bactrim- Itching.  Unknown  Contrast media (iodine-based)- No reactions were documented.  FentaNYL- No reactions were documented.  Morphine- No reactions were documented.  Rocephin- No reactions were documented..   Medications:  (Selected)   Inpatient Medications  Ordered  Benadryl: 25 mg, 0.5 mL, IV Push, Once  Dilaudid: 1 mg, 0.5 mL, IV Push, Once  promethazine IV - High Alert: 12.5 mg, 0.5 mL, IV Push, Once  Documented Medications  Documented  Benadryl 25 mg oral tablet: 50 mg, 2 tabs, Oral, PRN: prn, 0 Refill(s)  CeleXA 40 mg oral tablet: 40 mg, 1 tabs, Oral, Daily, 30 tabs, 0 Refill(s)  Colace 100 mg oral capsule: 100 mg, 1 caps, Oral, BID, PRN: as  needed for constipation, 0 Refill(s)  Dulcolax Laxative 5 mg oral delayed release tablet: 5 mg, 1 tabs, Oral, Daily, PRN, 0 Refill(s)  KlonoPIN 0.5 mg oral tablet: 0.5 mg, 1 tabs, Oral, Once a Day (at bedtime), 0 Refill(s)  Proventil HFA 90 mcg/inh inhalation aerosol: 2 puffs, Inhale, q4hr, PRN, 6.7 g, 0 Refill(s)  Remeron 15 mg oral tablet: 15 mg, 1 tabs, Oral, Once a Day (at bedtime), 30 tabs, 0 Refill(s)  cyclobenzaprine 5 mg oral tablet: 5 mg, 1 tabs, Oral, TID, for 14 days, 42 tabs, 0 Refill(s)  fentaNYL 50 mcg/hr transdermal film, extended release: 1 patches, TD, q72hr, 0 Refill(s)  folic acid 1 mg oral tablet: 1 mg, 1 tabs, Oral, Daily, 0 Refill(s)  ibuprofen 800 mg oral tablet: 800 mg, 1 tabs, Oral, TID, PRN: for pain, 30 tabs, 0 Refill(s)  oxyCODONE 30 mg oral tablet: 30 mg, 1 tabs, Oral, q4hr, PRN: for pain, 0 Refill(s)  promethazine 25 mg oral tablet: 25 mg, 1 tabs, Oral, q6hr, PRN: as needed for  nausea/vomiting, 0 Refill(s)  sodium bicarbonate 650 mg oral tablet: 325 mg, 0.5 tabs, Oral, BID, 0 Refill(s).      Past Medical/ Family/ Social History   Medical history: Reviewed as documented in chart.   Surgical history: Reviewed as documented in chart.   Family history: Not significant.   Social history: Reviewed as documented in chart.   Problem list:    Active Problems (4)  Alteration in comfort: pain   Hypertension   Shortness of breath   Sickle cell   .      Physical Examination               Vital Signs   Vital Signs   04/09/3328 5:18 EDT Systolic Blood Pressure 841 mmHg    Diastolic Blood Pressure 76 mmHg    Temperature Oral 37.0 degC    Heart Rate Monitored 88 bpm    Respiratory Rate 22 br/min  HI    SpO2 100 %   .   Measurements   06/09/2019 8:30 EDT Body Mass Index est meas 23.09 kg/m2    Body Mass Index Measured 23.09 kg/m2   06/09/2019 8:28 EDT Height/Length Measured 160 cm    Weight Dosing 59.1 kg   .   Basic Oxygen Information   06/09/2019 8:33 EDT Oxygen Therapy Room air   06/09/2019 8:28 EDT SpO2 100 %    Oxygen Therapy Room air   .   General:  Alert, no acute distress.    Skin:  Warm, dry, pink.    Head:  Normocephalic.   Neck:  Supple, trachea midline.    Eye:  Pupils are equal, round and reactive to light, extraocular movements are intact.    Ears, nose, mouth and throat:  Oral mucosa moist.   Cardiovascular:  Regular rate and rhythm.   Respiratory:  Lungs are clear to auscultation, respirations are non-labored.    Back:  Normal range of motion.   Musculoskeletal:  Normal ROM, no tenderness.    Chest wall   Gastrointestinal:  Soft, Nontender, Non distended, Normal bowel sounds, No organomegaly.    Neurological:  Alert and oriented to person, place, time, and situation, No focal neurological deficit observed, normal sensory observed, normal motor observed.    Lymphatics:  No lymphadenopathy.   Psychiatric:  Cooperative, appropriate mood & affect.       Medical Decision Making   Rationale:  33 year old  female with history of sickle cell anemia presents status  post a mechanical fall with superimposed pain crisis.  Plan will be for labs, reticulocyte count, CT head, x-ray thoracic spine, x-ray elbow.  Pain and nausea medications ordered..   Results review:  All Results   06/09/2019 10:15 EDT XR Spine Thoracic 3 Views IMAGE (Modified)   06/09/2019 10:15 EDT ED Note-Nursing     06/09/2019 10:14 EDT XR Elbow 2 Views Left IMAGE (Modified)   06/09/2019 10:14 EDT XR Chest 1 View Portable IMAGE (Modified)   06/09/2019 13:19 EDT Estimated Creatinine Clearance 131.10 mL/min    06/09/2019 9:19 EDT CT Head or Brain w/o Contrast IMAGE (Modified)   06/09/2019 8:48 EDT ED Note-Physician Fall *ED (In Progress)   06/09/2019 12:45 EDT Sodium Lvl 141 mmol/L     Potassium Lvl 3.7 mmol/L     Chloride 116 mmol/L  HI     CO2 13 mmol/L  LOW     Glucose Random 92 mg/dL     BUN 12 mg/dL     Creatinine Lvl 0.5 mg/dL     AGAP 12 mmol/L     Osmolality Calc 281 mOsm/kg     Calcium Lvl 7.7 mg/dL  LOW     eGFR AA 146 mL/min/1.64m???     eGFR Non-AA 126 mL/min/1.773m??    06/09/2019 8:32 EDT ED Triage Note     06/09/2019 8:28 EDT ED Triage Note     06/09/2019 8:25 EDT Consent Forms     8/1/7/5102258:52DT Systolic Blood Pressure 12778mHg     Diastolic Blood Pressure 80 mmHg     Temperature Oral 6.9 degC  LOW     Heart Rate Monitored 83 bpm     Respiratory Rate 16 br/min     SpO2 100 %     Oxygen Therapy Room air    06/09/2019 10:52 EDT Numeric Rating Pain Scale 8     Numeric Rating Pain Scale 8     Pasero Opioid Induced Sedation Scale 1=Awake and alert     Pasero Opioid Induced Sedation Scale 1=Awake and alert     Med Response Med Response     Med Response Med Response     ED Medication Response No adverse reaction, Symptoms improved     ED Medication Response No adverse reaction, Symptoms improved    8/2/4/2353061:44DT Systolic Blood Pressure 12315mHg     Diastolic Blood Pressure 80 mmHg     Heart Rate Monitored 80 bpm     Respiratory Rate 16 br/min     SpO2 100 %      Numeric Rating Pain Scale 8     Oxygen Therapy Room air    06/09/2019 10:15 EDT ED RN Progress Note Pt requesting additional pain and nausea meds.    8/4/0/0867:6:19DT Systolic Blood Pressure 11509mHg     Diastolic Blood Pressure 75 mmHg     Heart Rate Monitored 73 bpm     Respiratory Rate 16 br/min     SpO2 100 %     Numeric Rating Pain Scale 10 = Worst possible pain     Pasero Opioid Induced Sedation Scale 1=Awake and alert     Pasero Opioid Induced Sedation Scale 1=Awake and alert     Med Response Med Response     Med Response Med Response     Oxygen Therapy Room air     ED Medication Response No adverse reaction, No change in symptoms     ED Medication Response No adverse reaction, No change  in symptoms    06/09/2019 9:26 EDT Estimated Creatinine Clearance 131.10 mL/min    06/09/2019 9:05 EDT Chest Right      Central IV Indication: Provider order     Central IV Activity: Present on admission     Central IV Access: Power Implanted [employer] IV Number of Ports: Single     Proximal Central IV Needle Size: 3/4 inch/20G    06/09/2019 8:59 EDT WBC 21.5 x10e3/mcL  HI     RBC 2.89 x10e6/mcL  LOW     RBC 2.89 x10e6/mcL  LOW     Hgb 8.1 g/dL  LOW     HCT 23.3 %  LOW     MCV 80.6 fL  LOW     MCH 28.0 pg     MCHC 34.8 g/dL     RDW 16.5 %  HI     Platelet 479 x10e3/mcL  HI     MPV 9.9 fL     NRBC Absolute Auto 0.000 x10e3/mcL     NRBC Percent Auto 0.0 %     Neutrophil M Pct 68 %     Bands M Pct 4 %     Lymphcyte M Pct 21 %     Monocyte M Pct 6 %     Basophil M Pct 1 %     Neutrophil M # 14.6 x10e3/mcL  HI     Bands M # 0.9 x10e3/mcL  NA     Lymphocyte M # 4.5 x10e3/mcL  HI     Monocyte M # 1.3 x10e3/mcL  HI     Basophil M # 0.2 x10e3/mcL     Anisocytosis Few     Hypochromia Few     Ovalocytes Few     PLT estimate Increased     Polychromasia Few     RBC morphology Abnormal     Target cells Few     Retic Percent 3.7 %  HI     Absolute Retic 0.1055 /mcL     Sodium Lvl 139 mmol/L     Potassium Lvl 3.2 mmol/L  LOW     Chloride 110  mmol/L  HI     CO2 14 mmol/L  LOW     Glucose Random 136 mg/dL  HI     BUN 15 mg/dL     Creatinine Lvl 0.5 mg/dL     AGAP 15 mmol/L     Osmolality Calc 280 mOsm/kg     Calcium Lvl 9.1 mg/dL     Protein Total 7.6 g/dL     Albumin Lvl 4.8 g/dL     Globulin Calc 2.8 g/dL     AG Ratio Calc 1.71 mmol/L     Alk Phos 102 unit/L     AST 22 unit/L     ALT 13 unit/L     eGFR AA 146 mL/min/1.44m???     eGFR Non-AA 126 mL/min/1.765m??     Bili Total 1.20 mg/dL    06/09/2019 8:35 EDT Environmental Safety Implemented Bed in low position, Wheels locked, Call device within reach     Patient Status Rounding Patient ID checked, Denies any needs    06/09/2019 8:34 EDT Respirations Unlabored     Respiratory Pattern Regular     Patient Airway Status Patent without support     Skin Color General Usual for ethnicity     Skin Temperature Warm     Skin Moisture General Dry     Elbow Left Posterior  Musculoskeletal Abnormality: Enlarged/swelling     Musculoskeletal Symptoms: Pain at rest, Pain with movement     Musculoskeletal Range of Motion: Limited motion, active     Back      Musculoskeletal Abnormality: No abnormalities     Musculoskeletal Symptoms: Pain at rest, Pain with movement     Level of Consciousness - APVU Alert     Affect/Behavior Appropriate     Orientation Assessment Oriented x 4     ED RN Progress Note L elbow pain, back and head pain, Pt also c/o generalized pain    06/09/2019 8:33 EDT Oxygen Therapy Room air    06/09/2019 8:32 EDT Hx of falling last 3 months ED Fall Yes (Single mechanical fall)     Patient confused of disoriented ED Fall No     Patient intoxicated or sedated ED Fall No     Patient have an impaired gait ED Fall No     Use a mobility assistance device ED Fall No     Patient altered elimination ED Fall No     [facility] ED Fall Score 1     Advanced Directives No     Barriers to Learning None evident    06/09/2019 8:30 EDT Estimated Creatinine Clearance 109.25 mL/min     Body Mass Index est meas 23.09 kg/m2     Body  Mass Index Measured 23.09 kg/m2    06/09/2019 8:28 EDT Height/Length Measured 160 cm     Weight Dosing 24.2 kg     Systolic Blood Pressure 353 mmHg     Diastolic Blood Pressure 76 mmHg     Temperature Oral 37.0 degC     Heart Rate Monitored 88 bpm     Respiratory Rate 22 br/min  HI     SpO2 100 %     Numeric Rating Pain Scale 10 = Worst possible pain     Oxygen Therapy Room air     Pregnancy Status Patient denies     Last 3 mo, thoughts killing self/others Patient denies     SIRS Possible Source of Infectionn No     SIRS Source of Infection None of the above     ED Behavioral Activity Rating Scale 4 - Quiet and awake (normal level of activity)     Recent Travel History ID No recent travel     Close Contact with COVID-19  ID No     Last 14 days COVID-19 ID No    .   Radiology results:  Rad Results (ST)   CT Head or Brain w/o Contrast  ?  06/09/19 09:22:28  CT head WITHOUT: 06/09/19    COMPARISON: None    INDICATION: Head trauma, headache    TECHNIQUE: Helical imaging with axial reconstruction. CT low radiation dose  techniques were applied when appropriate according to department protocol.    FINDINGS: No hemorrhage mass effect or extra-axial fluid collection is apparent.  No ventricular dilatation is evident. No acute bony abnormality is demonstrated  and the visualized sinuses are clear.    IMPRESSION:  No acute intracranial abnormality.  ?  Signed By: Jaquita Rector  ?  **************************************************  XR Spine Thoracic 3 Views  ?  06/09/19 10:32:00  Thoracic spine, 3 projections: 06/09/19    INDICATION:Fracture. Sickle cell pain, fall    COMPARISON: Chest radiograph 05/17/2019    FINDINGS: Slight superior and inferior central endplate depression, consistent  with history of sickle cell. Otherwise, the vertebral body heights are  well-maintained. The  disc spaces are relatively well-maintained. No significant  degenerative findings. Chest port catheter tip overlies the superior vena  cava.  Surgical clips overlie the lower chest and upper abdomen. The cardiac silhouette  is enlarged, unchanged.    IMPRESSION:  Osseous sequela of sickle cell without evidence of acute radiographic  abnormality of the thoracic spine.  ?  Signed By: Earma Reading  ?  **************************************************  XR Elbow 2 Views Left  ?  06/09/19 10:32:40  INDICATION: Injury, elbow to wrist    COMPARISON: None.    2 radiographic views of the LEFT elbow, 06/09/19.    FINDINGS:  No displaced acute fracture, dislocation or radiopaque foreign body.    IMPRESSION:  No acute abnormality.    If you have questions regarding this report, please feel free to call me  directly using [employer].  ?  Signed By: Clent Ridges  ?  **************************************************  XR Chest 1 View Portable  ?  06/09/19 10:30:06  Chest AP: 06/09/19    INDICATION:Chest pain. Sickle cell    COMPARISON: 05/17/2019    FINDINGS: The cardiomediastinal silhouette is enlarged, unchanged. Accessed  right chest port catheter tip overlies the superior vena cava. Unchanged areas  of scarring in the lung bases. No new focal consolidations. Surgical clips  overlie the upper abdomen. No pneumothorax or pleural thickening. There are no  acute osseous abnormalities.    IMPRESSION:  No new focal airspace opacities. Stable cardiomegaly and scattered areas of  scarring.  ?  Signed By: Earma Reading  .      Reexamination/ Reevaluation   Vital signs   Basic Oxygen Information   06/09/2019 8:33 EDT Oxygen Therapy Room air   06/09/2019 8:28 EDT SpO2 100 %    Oxygen Therapy Room air      Notes: Trauma workup negative.    Patient has significant acidosis. Given NS bolus. Repeated and dropped to 13. I am concerned about dehydration and infection especially in the setting of vasoocclusive crisis.    I recommended admission for the acidosis. Patient has politely refused. She wants to sign out AMA. Discussed risk of infection and dehydration. I  have left the door open for her to return at any time. .      Impression and Plan   Diagnosis   Acidosis (ICD10-CM E87.2, Discharge, Medical)   Dehydration (ICD10-CM E86.0, Discharge, Medical)   Vasoocclusive sickle cell crisis (ICD10-CM D57.00, Discharge, Medical)   Head injury (ICD10-CM S09.90XA, Discharge, Medical)   Back strain (ICD10-CM S39.012A, Discharge, Medical)   Plan   Condition: Stable.    Disposition: Discharged: to home.    Patient was given the following educational materials: Dehydration, Adult, Sickle Cell Anemia, Adult, Sickle Cell Anemia, Adult, Dehydration, Adult.    Limitations: Limited activity.    Follow up with: Return to Emergency Department if you have any new or worsening symptoms, Return to Emergency Department, In: as needed.    Counseled: Patient.    Signature Line     Electronically Signed on 06/09/2019 01:30 PM EDT   ________________________________________________   Maysin Carstens-MD, Marijo Conception               Modified by: Whitman Hero on 06/09/2019 01:30 PM EDT

## 2019-06-09 NOTE — ED Notes (Signed)
ED Note-Nursing       ED RN Reassessment Entered On:  06/09/2019 10:18 EDT    Performed On:  06/09/2019 10:15 EDT by Chestine Spore, RN, Georgiann Mohs               ED RN Reassessment   ED RN Progress Note :   Pt requesting additional pain and nausea meds.   Chestine Spore RN, Georgiann Mohs - 06/09/2019 10:18 EDT

## 2019-07-06 NOTE — ED Notes (Signed)
 ED Patient Summary       ;       [facility] Emergency Department  [address] [place] [address]  [phone]  Discharge Instructions (Patient)  _______________________________________     Name: Judith [name], Judith [name]  DOB: November 14, 1985                   MRN: [id number]                   FIN: [id number]  Reason For Visit: Sickle cell pain; SICKLE CELL  Final Diagnosis: Sickle cell crisis     Visit Date: 07/06/2019 04:43:00  Address: [address] [place] [address]  Phone: [phone]     Primary Care Provider:      Name: PCP,  NONE      Phone:         Emergency Department Providers:         Primary Physician:   THEO DESIDERIO Shelvy Gwenn Lionel would like to thank you for allowing us  to assist you with your healthcare needs. The following includes patient education materials and information regarding your injury/illness.     Follow-up Instructions:  You were treated today on an emergency basis. If instructed, please contact your primary care provider to arrange for follow-up and for any further concerns. Whether you have been referred to your primary care doctor or a specialist, please follow-up as instructed.      If you do not have a doctor, you may call [phone] for assistance with finding a [facility] primary care physician or specialist. Staff is available to help schedule you an appointment.      Not sure where to go with questions about your health? We're here for you. The [facility] "Ask a Nurse" Line in staffed by experienced nurses and is a free service to the community, available Monday - Friday from 8AM to 5PM. Call [phone].      If your condition worsens before your follow-up with an outpatient physician, please return to the Emergency Department.              With: Address: When:   Follow up with primary care provider  Within 1 to 2 days   Comments:   If need assistance in establishing a primary care doctor call [phone]               Printed Prescriptions:    Patient Education Materials:  Discharge Orders          Discharge Patient 07/06/19 5:59:00 EDT         Comment:      Sickle Cell Anemia, Adult     Sickle Cell Anemia, Adult    Sickle cell anemia is a condition in which red blood cells have an abnormal sickle shape. This abnormal shape shortens the cells' life span, which results in a lower than normal concentration of red blood cells in the blood. The sickle shape also causes the cells to clump together and block free blood flow through the blood vessels. As a result, the tissues and organs of the body do not receive enough oxygen. Sickle cell anemia causes organ damage and pain and increases the risk of infection.      CAUSES    Sickle cell anemia is a genetic disorder. Those who receive two copies of the gene have the condition, and those who  receive one copy have the trait.    RISK FACTORS    The sickle cell gene is most common in people whose families originated in [place]. Other areas of the globe where sickle cell trait occurs include the [place], [place], the [place], and the [place].     SIGNS AND SYMPTOMS     Pain, especially in the extremities, back, chest, or abdomen (common). The pain may start suddenly or may develop following an illness, especially if there is dehydration. Pain can also occur due to overexertion or exposure to extreme temperature changes.     Frequent severe bacterial infections, especially certain types of pneumonia and meningitis.     Pain and swelling in the hands and feet.     Decreased activity. ?     Loss of appetite. ?     Change in behavior.      Headaches.     Seizures.     Shortness of breath or difficulty breathing.     Vision changes.     Skin ulcers.    Those with the trait may not have symptoms or they may have mild symptoms.     DIAGNOSIS    Sickle cell anemia is diagnosed with blood tests that demonstrate the genetic trait. It is often diagnosed  during the newborn period, due to mandatory testing nationwide. A variety of blood tests, X-rays, CT scans, MRI scans, ultrasounds, and lung function tests may also be done to monitor the condition.    TREATMENT    Sickle cell anemia may be treated with:     Medicines. You may be given pain medicines, antibiotic medicines (to treat and prevent infections) or medicines to increase the production of certain types of hemoglobin.     Fluids.     Oxygen.     Blood transfusions.    HOME CARE INSTRUCTIONS     Drink enough fluid to keep your urine clear or pale yellow. Increase your fluid intake in hot weather and during exercise.      Do not smoke. Smoking lowers oxygen levels in the blood. ?     Only take over-the-counter or prescription medicines for pain, fever, or discomfort as directed by your health care provider.     Take antibiotics as directed by your health care provider. Make sure you finish them it even if you start to feel better. ?     Take supplements as directed by your health care provider. ?     Consider wearing a medical alert bracelet. This tells anyone caring for you in an emergency of your condition. ?     When traveling, keep your medical information, health care provider's names, and the medicines you take with you at all times. ?     If you develop a fever, do not take medicines to reduce the fever right away. This could cover up a problem that is developing. Notify your health care provider.      Keep all follow-up appointments with your health care provider. Sickle cell anemia requires regular medical care.     SEEK MEDICAL CARE IF:    ?You have a fever.    SEEK IMMEDIATE MEDICAL CARE IF:     You feel dizzy or faint. ?     You have new abdominal pain, especially on the left side near the stomach area. ?     You develop a persistent, often uncomfortable and painful penile erection (priapism). If this is not  treated immediately it will lead to impotence. ?     You have numbness your arms or legs or  you have a hard time moving them. ?     You have a hard time with speech. ?     You have a fever or persistent symptoms for more than 2?3 days. ?     You have a fever and your symptoms suddenly get worse. ?     You have signs or symptoms of infection. These include: ?    ? Chills. ?    ? Abnormal tiredness (lethargy). ?    ? Irritability. ?    ? Poor eating. ?    ? Vomiting. ?     You develop pain that is not helped with medicine. ?     You develop shortness of breath.     You have pain in your chest. ?     You are coughing up pus-like or bloody sputum. ?     You develop a stiff neck.     Your feet or hands swell or have pain.     Your abdomen appears bloated.     You develop joint pain.    MAKE SURE YOU:     Understand these instructions.    This information is not intended to replace advice given to you by your health care provider. Make sure you discuss any questions you have with your health care provider.    Document Released: 01/31/2006 Document Revised: 11/13/2014 Document Reviewed: 06/04/2013  Elsevier Interactive Patient Education ?2016 Elsevier Inc.         Allergy Info: fentaNYL; contrast media (iodine-based); Rocephin; Bactrim; morphine     Medication Information:  [facility] ED Physicians provided you with a complete list of medications post discharge, if you have been instructed to stop taking a medication please ensure you also follow up with this information to your Primary Care Physician.  Unless otherwise noted, patient will continue to take medications as prescribed prior to the Emergency Room visit.  Any specific questions regarding your chronic medications and dosages should be discussed with your physician(s) and pharmacist.          albuterol (Proventil HFA 90 mcg/inh inhalation aerosol) 2 Puffs Inhale (breathe in) every 4 hours as needed.  bisacodyl (Dulcolax Laxative 5 mg oral delayed release tablet) 1 Tabs Oral (given by mouth) every day as needed., DO NOT CRUSH  citalopram  (CeleXA 40 mg oral tablet) 1 Tabs Oral (given by mouth) every day., SOUND ALIKE / LOOK ALIKE - VERIFY DRUG  clonazePAM (KlonoPIN 0.5 mg oral tablet) 1 Tabs Oral (given by mouth) Once a Day (at bedtime)., SOUND ALIKE / LOOK ALIKE - VERIFY DRUG   SOUND ALIKE / LOOK ALIKE - VERIFY DRUG  cyclobenzaprine (cyclobenzaprine 5 mg oral tablet) 1 Tabs Oral (given by mouth) 3 times a day for 14 Days.,  THIS MEDICATION IS ASSOCIATED  WITH  AN INCREASED RISK OF FALLS.  diphenhydrAMINE (Benadryl 25 mg oral tablet) 2 Tabs Oral (given by mouth) as needed prn.  docusate (Colace 100 mg oral capsule) 1 Capsules Oral (given by mouth) 2 times a day as needed as needed for constipation.  fentaNYL (fentaNYL 50 mcg/hr transdermal film, extended release) 1 Patches Transdermal (apply on the skin) every 72 hours.  folic acid (folic acid 1 mg oral tablet) 1 Tabs Oral (given by mouth) every day.  ibuprofen (ibuprofen 800 mg oral tablet) 1 Tabs Oral (given by mouth) 3 times  a day as needed for pain.  mirtazapine (Remeron 15 mg oral tablet) 1 Tabs Oral (given by mouth) Once a Day (at bedtime).  oxyCODONE (oxyCODONE 30 mg oral tablet) 1 Tabs Oral (given by mouth) every 4 hours as needed for pain.  promethazine (promethazine 25 mg oral tablet) 1 Tabs Oral (given by mouth) every 6 hours as needed as needed for nausea/vomiting.  sodium bicarbonate (sodium bicarbonate 650 mg oral tablet) 0.5 Tabs Oral (given by mouth) 2 times a day.      Medications Administered During Visit:              Medication Dose Route   hydromorphone 1 mg IV Push   diphenhydrAMINE 50 mg IV Push   ketorolac 15 mg IV Push   Sodium Chloride 0.9% 1000 mL IV Piggyback   promethazine 25 mg IV   hydromorphone 1 mg IV Push          Major Tests and Procedures:  The following procedures and tests were performed during your ED visit.  COMMON PROCEDURES%>  COMMON PROCEDURES COMMENTS%>          Laboratory Orders  No laboratory orders were placed.              Radiology Orders  No  radiology orders were placed.              Patient Care Orders  Name Status Details   Discharge Patient Ordered 07/06/19 5:59:00 EDT   ED Assessment Adult Completed 07/06/19 4:50:30 EDT, 07/06/19 4:50:30 EDT   ED Secondary Triage Completed 07/06/19 4:50:30 EDT, 07/06/19 4:50:30 EDT   ED Triage Adult Completed 07/06/19 4:43:37 EDT, 07/06/19 4:43:37 EDT   May Access Port Completed 07/06/19 5:27:00 EDT, This message can only be seen by Nursing, it is not visible to Pharmacy, Laboratory, or Radiology., 07/06/19 5:27:00 EDT, 07/06/19 5:27:00 EDT, Once       ---------------------------------------------------------------------------------------------------------------------  [facility]) encourages you to self-enroll in the [facility] Patient Portal.  [facility] Patient Portal will allow you to manage your personal health information securely from your own electronic device now and in the future.  To begin your Patient Portal enrollment process, please visit [email or web address] Click on "Sign up now" under [facility].  If you find that you need additional assistance on the [facility] Patient Portal or need a copy of your medical records, please call the [facility] Medical Records Office at [phone].  Comment:

## 2019-07-06 NOTE — ED Notes (Signed)
ED Triage Note       ED Triage Adult Entered On:  07/06/2019 4:50 EDT    Performed On:  07/06/2019 4:48 EDT by WILL, RN, SHAYE P               Triage   Chief Complaint :   sickle cell pain to back and upper abdomen. with some nausea and vomiting. for 3 days    Numeric Rating Pain Scale :   8   [place] Mode of Arrival :   Walking   Infectious Disease Documentation :   Document assessment   Temperature Oral :   36.8 degC(Converted to: 98.2 degF)    Heart Rate Monitored :   114 bpm (HI)    Respiratory Rate :   20 br/min   Systolic Blood Pressure :   258 mmHg   Diastolic Blood Pressure :   88 mmHg   SpO2 :   99 %   Oxygen Therapy :   Room air   Patient presentation :   None of the above   Chief Complaint or Presentation suggest infection :   Yes   Weight Dosing :   57 kg(Converted to: 125 lb 11 oz)    Height :   160 cm(Converted to: 5 ft 3 in)    Body Mass Index Dosing :   22 kg/m2   WILL, RN, SHAYE P - 07/06/2019 4:48 EDT   DCP GENERIC CODE   Tracking Acuity :   3   Tracking Group :   ED [address] Tracking Group   WILL, RN, Hildred Priest - 07/06/2019 4:48 EDT   ED General Section :   Document assessment   Pregnancy Status :   Patient denies   ED Allergies Section :   Document assessment   ED Reason for Visit Section :   Document assessment   ED Quick Assessment :   Patient appears awake, alert, oriented to baseline. Skin warm and dry. Moves all extremities. Respiration even and unlabored. Appears in no apparent distress.   WILL, RN, Hildred Priest - 07/06/2019 4:48 EDT   ID Risk Screen Symptoms   Recent Travel History :   No recent travel   Close Contact with COVID-19 ID :   No   Last 14 days COVID-19 ID :   No   TB Symptom Screen :   No symptoms   C. diff Symptom/History ID :   Neither of the above   WILL, RN, SHAYE P - 07/06/2019 4:48 EDT   Allergies   (As Of: 07/06/2019 04:50:29 EDT)   Allergies (Active)   Bactrim  Estimated Onset Date:   Unspecified ; Reactions:   Itching ; Created By:   Teressa Senter RN, Ander Purpura B; Reaction Status:   Active ;  Category:   Drug ; Substance:   Bactrim ; Type:   Allergy ; Severity:   Moderate ; Updated By:   Thedora Hinders; Reviewed Date:   07/06/2019 4:48 EDT      contrast media (iodine-based)  Estimated Onset Date:   Unspecified ; Created ByOkey Dupre, RN, JENNETTE; Reaction Status:   Active ; Category:   Drug ; Substance:   contrast media (iodine-based) ; Type:   Allergy ; Severity:   Unknown ; Updated By:   Recardo Evangelist; Reviewed Date:   07/06/2019 4:48 EDT      fentaNYL  Estimated Onset Date:   Unspecified ; Created ByOkey Dupre, RN, JENNETTE;  Reaction Status:   Active ; Category:   Drug ; Substance:   fentaNYL ; Type:   Allergy ; Severity:   Unknown ; Updated By:   Irene LimboALBAYALDE, RN, JENNETTE; Reviewed Date:   07/06/2019 4:48 EDT      morphine  Estimated Onset Date:   Unspecified ; Created By:   Rock NephewALBAYALDE, RN, JENNETTE; Reaction Status:   Active ; Category:   Drug ; Substance:   morphine ; Type:   Allergy ; Severity:   Unknown ; Updated By:   Irene LimboALBAYALDE, RN, JENNETTE; Reviewed Date:   07/06/2019 4:48 EDT      Rocephin  Estimated Onset Date:   Unspecified ; Created By:   Rock NephewALBAYALDE, RN, JENNETTE; Reaction Status:   Active ; Category:   Drug ; Substance:   Rocephin ; Type:   Allergy ; Severity:   Unknown ; Updated By:   Irene LimboALBAYALDE, RN, JENNETTE; Reviewed Date:   07/06/2019 4:48 EDT        Psycho-Social   Last 3 mo, thoughts killing self/others :   Patient denies   WILL, RN, Dante GangSHAYE P - 07/06/2019 4:48 EDT   ED Reason for Visit   (As Of: 07/06/2019 04:50:29 EDT)   Problems(Active)    Alteration in comfort: pain (SNOMED CT  :[id number] )  Name of Problem:   Alteration in comfort: pain ; Recorder:   SYSTEM,  SYSTEM; Confirmation:   Confirmed ; Classification:   Interdisciplinary ; Code:   [id number] ; Last Updated:   03/28/2019 2:23 EDT ; Life Cycle Date:   03/28/2019 ; Life Cycle Status:   Active ; Vocabulary:   SNOMED CT   ; Comments:        03/28/2019 2:23 - SYSTEM,  SYSTEM  Problem added automatically by system based on  initiation of Alteration in Comfort Plan of Care      Hypertension (IMO  :[id number] )  Name of Problem:   Hypertension ; Recorder:   Rock NephewALBAYALDE, RN, JENNETTE; Confirmation:   Confirmed ; Classification:   Medical ; Code:   [phone] ; Contributor System:   [facility] ; Last Updated:   11/08/2015 5:36 EST ; Life Cycle Date:   11/08/2015 ; Life Cycle Status:   Active ; Vocabulary:   IMO        Shortness of breath (IMO  :[id number] )  Name of Problem:   Shortness of breath ; Recorder:   SYSTEM,  SYSTEM; Confirmation:   Confirmed ; Classification:   Patient Stated ; Code:   78469:   [id number] ; Last Updated:   04/17/2019 15:58 EDT ; Life Cycle Date:   04/17/2019 ; Life Cycle Status:   Active ; Vocabulary:   IMO        Sickle cell (SNOMED CT  :[id number] )  Name of Problem:   Sickle cell ; Recorder:   ALBAYALDE, RN, JENNETTE; Confirmation:   Confirmed ; Classification:   Medical ; Code:   [id number] ; Contributor System:   [facility]Chart ; Last Updated:   11/08/2015 5:36 EST ; Life Cycle Date:   11/08/2015 ; Life Cycle Status:   Active ; Vocabulary:   SNOMED CT          Diagnoses(Active)    Sickle cell pain  Date:   07/06/2019 ; Diagnosis Type:   Reason For Visit ; Confirmation:   Complaint of ; Clinical Dx:   Sickle cell pain ; Classification:   Medical ; Clinical Service:   Emergency medicine ; Code:   PNED ; Probability:   0 ;  Diagnosis Code:   [id number]

## 2019-07-06 NOTE — ED Provider Notes (Signed)
Sickle Cell Crisis *ED        Patient:   Judith [name], Judith [name]            MRN: [id number]            FIN: [id number]               Age:   33 years     Sex:  Female     DOB:  [birth year]   Associated Diagnoses:   Sickle cell crisis   Author:   Elayne Guerin      Basic Information   Additional information: Chief Complaint from Nursing Triage Note   Chief Complaint  Chief Complaint: sickle cell pain to back and upper abdomen. with some nausea and vomiting. for 3 days (07/06/19 04:48:00).      History of Present Illness   The patient presents with Presents with a typical sickle cell pain however she did have some nausea and vomiting was unable to keep her home pain medicine down no associated fever chills no chest pain no shortness of breath no radiation.        Review of Systems   Constitutional symptoms:  No fever,    Skin symptoms:  No petechiae,    Respiratory symptoms:  No hemoptysis,    Psychiatric symptoms:  no si  .             Additional review of systems information: Other pertinent  systems reveiwed as negative  except as documented in the HPI.  Marland Kitchen      Health Status   Allergies:    Allergic Reactions (Selected)  Moderate  Bactrim- Itching.  Unknown  Contrast media (iodine-based)- No reactions were documented.  FentaNYL- No reactions were documented.  Morphine- No reactions were documented.  Rocephin- No reactions were documented..   Medications:  (Selected)   Inpatient Medications  Ordered  Benadryl: 50 mg, 1 mL, IV Push, Once  Dilaudid: 1 mg, IV Push, Once  Phenergan IV - High Alert: 25 mg, 1 mL, IV, Once  Sodium Chloride 0.9% bolus: 1,000 mL, IV Piggyback, Once  Toradol: 15 mg, 1 mL, IV Push, Once  Documented Medications  Documented  Benadryl 25 mg oral tablet: 50 mg, 2 tabs, Oral, PRN: prn, 0 Refill(s)  CeleXA 40 mg oral tablet: 40 mg, 1 tabs, Oral, Daily, 30 tabs, 0 Refill(s)  Colace 100 mg oral capsule: 100 mg, 1 caps, Oral, BID, PRN: as needed for constipation, 0 Refill(s)  Dulcolax Laxative 5 mg oral delayed  release tablet: 5 mg, 1 tabs, Oral, Daily, PRN, 0 Refill(s)  KlonoPIN 0.5 mg oral tablet: 0.5 mg, 1 tabs, Oral, Once a Day (at bedtime), 0 Refill(s)  Proventil HFA 90 mcg/inh inhalation aerosol: 2 puffs, Inhale, q4hr, PRN, 6.7 g, 0 Refill(s)  Remeron 15 mg oral tablet: 15 mg, 1 tabs, Oral, Once a Day (at bedtime), 30 tabs, 0 Refill(s)  cyclobenzaprine 5 mg oral tablet: 5 mg, 1 tabs, Oral, TID, for 14 days, 42 tabs, 0 Refill(s)  fentaNYL 50 mcg/hr transdermal film, extended release: 1 patches, TD, q72hr, 0 Refill(s)  folic acid 1 mg oral tablet: 1 mg, 1 tabs, Oral, Daily, 0 Refill(s)  ibuprofen 800 mg oral tablet: 800 mg, 1 tabs, Oral, TID, PRN: for pain, 30 tabs, 0 Refill(s)  oxyCODONE 30 mg oral tablet: 30 mg, 1 tabs, Oral, q4hr, PRN: for pain, 0 Refill(s)  promethazine 25 mg oral tablet: 25 mg, 1 tabs, Oral, q6hr, PRN: as needed for nausea/vomiting, 0  Refill(s)  sodium bicarbonate 650 mg oral tablet: 325 mg, 0.5 tabs, Oral, BID, 0 Refill(s).      Past Medical/ Family/ Social History   Problem list:    Active Problems (4)  Alteration in comfort: pain   Hypertension   Shortness of breath   Sickle cell   .      Physical Examination               Vital Signs   Vital Signs   07/06/2019 4:48 EDT Systolic Blood Pressure 120 mmHg    Diastolic Blood Pressure 88 mmHg    Temperature Oral 36.8 degC    Heart Rate Monitored 114 bpm  HI    Respiratory Rate 20 br/min    SpO2 99 %   .   Measurements   07/06/2019 4:50 EDT Body Mass Index est meas 22.27 kg/m2    Body Mass Index Measured 22.27 kg/m2   07/06/2019 4:48 EDT Height/Length Measured 160 cm    Weight Dosing 57 kg   .   Basic Oxygen Information   07/06/2019 4:48 EDT SpO2 99 %    Oxygen Therapy Room air   .   General:  No acute distress.   Skin:  No pallor.   Eye:  Vision grossly normal.   Ears, nose, mouth and throat:  Oral mucosa moist.   Cardiovascular:  Normal peripheral perfusion.   Respiratory:  no respiratory distress .   Neurological:  Normal speech observed.    Psychiatric:  Appropriate mood & affect, non-suicidal.       Medical Decision Making   Rationale:  Patient with sickle cell thrombo-occlusive crisis likely secondary to mild dehydration and inability to keep her pain medicine she was initiated on IV therapy she had no evidence of acute chest syndrome she was afebrile as such she was treated pain control with good response thus discharged stable condition given antiemetic prescription for home she will need to see her regular doctor for refill long-term pain medicines.   Documents reviewed:  Emergency department nurses' notes, flowsheet, emergency department records, prior records, vital signs.       Reexamination/ Reevaluation   Vital signs   Basic Oxygen Information   07/06/2019 4:48 EDT SpO2 99 %    Oxygen Therapy Room air         Impression and Plan   Diagnosis   Sickle cell crisis (ICD10-CM D57.00, Discharge, Medical)   Plan   Condition: Improved.    Disposition: Medically cleared, Discharged: to home.    Patient was given the following educational materials: Sickle Cell Anemia, Adult, Sickle Cell Anemia, Adult.    Follow up with: Follow up with primary care provider Within 1 to 2 days If need assistance in establishing a primary care doctor call [phone].    Notes: The patient was counseled at length by the physician and nursing staff on the disposition  plans well as  any pertinent laboratory and radiology findings  were reviewed with the patient and or family.  They  were informed that there results  may be preliminary and that any  discrepencies  would conveyed  to them via  the contact number they  provided to the registration staff.  Marine scientist.    Signature Line     Electronically Signed on 07/06/2019 05:35 AM EDT   ________________________________________________   Kassie Mendsehnkamp,  Leander Tout-MD, MD

## 2019-07-06 NOTE — Discharge Summary (Signed)
 ED Clinical Summary                     [facility]  [address]  [place], [place] [address]  [id number]          PERSON INFORMATION  Name: Judith [name] Age:  33 Years DOB: 1986/08/15   Sex: Female Language: English PCP: PCP,  NONE   Marital Status: Single Phone: [phone] Med Service: KERIN ERICHSEN Nurses   MRN: [id number] Acct# [id number] Arrival: 07/06/2019 04:43:00   Visit Reason: Sickle cell pain; SICKLE CELL Acuity: 3 LOS: 000 01:59   Address:    [address] [place] [facility] [address]   Diagnosis:    Sickle cell crisis  Medications:          Medications that have not changed  Other Medications  albuterol (Proventil HFA 90 mcg/inh inhalation aerosol) 2 Puffs Inhale (breathe in) every 4 hours as needed.  Last Dose:____________________  bisacodyl (Dulcolax Laxative 5 mg oral delayed release tablet) 1 Tabs Oral (given by mouth) every day as needed., DO NOT CRUSH  Last Dose:____________________  citalopram (CeleXA 40 mg oral tablet) 1 Tabs Oral (given by mouth) every day., SOUND ALIKE / LOOK ALIKE - VERIFY DRUG  Last Dose:____________________  clonazePAM (KlonoPIN 0.5 mg oral tablet) 1 Tabs Oral (given by mouth) Once a Day (at bedtime)., SOUND ALIKE / LOOK ALIKE - VERIFY DRUG   SOUND ALIKE / LOOK ALIKE - VERIFY DRUG  Last Dose:____________________  cyclobenzaprine (cyclobenzaprine 5 mg oral tablet) 1 Tabs Oral (given by mouth) 3 times a day for 14 Days.,  THIS MEDICATION IS ASSOCIATED  WITH  AN INCREASED RISK OF FALLS.  Last Dose:____________________  diphenhydrAMINE (Benadryl 25 mg oral tablet) 2 Tabs Oral (given by mouth) as needed prn.  Last Dose:____________________  docusate (Colace 100 mg oral capsule) 1 Capsules Oral (given by mouth) 2 times a day as needed as needed for constipation.  Last Dose:____________________  fentaNYL (fentaNYL 50 mcg/hr transdermal film, extended release) 1 Patches Transdermal (apply on the skin) every 72 hours.  Last  Dose:____________________  folic acid (folic acid 1 mg oral tablet) 1 Tabs Oral (given by mouth) every day.  Last Dose:____________________  ibuprofen (ibuprofen 800 mg oral tablet) 1 Tabs Oral (given by mouth) 3 times a day as needed for pain.  Last Dose:____________________  mirtazapine (Remeron 15 mg oral tablet) 1 Tabs Oral (given by mouth) Once a Day (at bedtime).  Last Dose:____________________  oxyCODONE (oxyCODONE 30 mg oral tablet) 1 Tabs Oral (given by mouth) every 4 hours as needed for pain.  Last Dose:____________________  promethazine (promethazine 25 mg oral tablet) 1 Tabs Oral (given by mouth) every 6 hours as needed as needed for nausea/vomiting.  Last Dose:____________________  sodium bicarbonate (sodium bicarbonate 650 mg oral tablet) 0.5 Tabs Oral (given by mouth) 2 times a day.  Last Dose:____________________      Medications Administered During Visit:                Medication Dose Route   hydromorphone 1 mg IV Push   diphenhydrAMINE 50 mg IV Push   ketorolac 15 mg IV Push   Sodium Chloride 0.9% 1000 mL IV Piggyback   promethazine 25 mg IV   hydromorphone 1 mg IV Push               Allergies      Rocephin      fentaNYL  morphine      contrast media (iodine-based)      Bactrim (Itching)      Major Tests and Procedures:  The following procedures and tests were performed during your ED visit.  COMMON PROCEDURES%>  COMMON PROCEDURES COMMENTS%>                PROVIDER INFORMATION               Provider Role Assigned Sampson Epps, RN, Delon LABOR ED Nurse 07/06/2019 04:54:25    THEO GULLY ED Provider 07/06/2019 05:22:08        Attending Physician:  THEO GULLY      Admit Doc  DEHNKAMP-MD,  WADE     Consulting Doc       VITALS INFORMATION  Vital Sign Triage Latest   Temp Oral ORAL_1%> ORAL%>   Temp Temporal TEMPORAL_1%> TEMPORAL%>   Temp Intravascular INTRAVASCULAR_1%> INTRAVASCULAR%>   Temp Axillary AXILLARY_1%> AXILLARY%>   Temp Rectal RECTAL_1%> RECTAL%>   02 Sat 99 % 99 %    Respiratory Rate RATE_1%> RATE%>   Peripheral Pulse Rate PULSE RATE_1%>109 bpm PULSE RATE%>   Apical Heart Rate HEART RATE_1%> HEART RATE%>   Blood Pressure BLOOD PRESSURE_1%>/ BLOOD PRESSURE_1%>88 mmHg BLOOD PRESSURE%> / BLOOD PRESSURE%>81 mmHg                 Immunizations      No Immunizations Documented This Visit          DISCHARGE INFORMATION   Discharge Disposition: H Outpt-Sent Home   Discharge Location:  Home   Discharge Date and Time:  07/06/2019 06:42:31   ED Checkout Date and Time:  07/06/2019 06:42:31     DEPART REASON INCOMPLETE INFORMATION               Depart Action Incomplete Reason   Interactive View/I&O Recently assessed               Problems      Active           Hypertension          Sickle cell              Smoking Status      Former smoker         PATIENT EDUCATION INFORMATION  Instructions:     Sickle Cell Anemia, Adult     Follow up:                   With: Address: When:   Follow up with primary care provider  Within 1 to 2 days   Comments:   If need assistance in establishing a primary care doctor call [phone]              ED PROVIDER DOCUMENTATION     Patient:   Judith [name], Judith [name]            MRN: [id number]            FIN: [id number]               Age:   33 years     Sex:  Female     DOB:  December 14, 1985   Associated Diagnoses:   Sickle cell crisis   Author:   THEO GULLY      Basic Information   Additional information: Chief Complaint from Nursing Triage Note   Chief Complaint  Chief Complaint: sickle cell pain to back and upper abdomen. with  some nausea and vomiting. for 3 days (07/06/19 04:48:00).      History of Present Illness   The patient presents with Presents with a typical sickle cell pain however she did have some nausea and vomiting was unable to keep her home pain medicine down no associated fever chills no chest pain no shortness of breath no radiation.        Review of Systems   Constitutional symptoms:  No fever,    Skin symptoms:  No petechiae,    Respiratory symptoms:   No hemoptysis,    Psychiatric symptoms:  no si  .             Additional review of systems information: Other pertinent  systems reveiwed as negative  except as documented in the HPI.  SABRA      Health Status   Allergies:    Allergic Reactions (Selected)  Moderate  Bactrim- Itching.  Unknown  Contrast media (iodine-based)- No reactions were documented.  FentaNYL- No reactions were documented.  Morphine- No reactions were documented.  Rocephin- No reactions were documented..   Medications:  (Selected)   Inpatient Medications  Ordered  Benadryl: 50 mg, 1 mL, IV Push, Once  Dilaudid: 1 mg, IV Push, Once  Phenergan IV - High Alert: 25 mg, 1 mL, IV, Once  Sodium Chloride 0.9% bolus: 1,000 mL, IV Piggyback, Once  Toradol: 15 mg, 1 mL, IV Push, Once  Documented Medications  Documented  Benadryl 25 mg oral tablet: 50 mg, 2 tabs, Oral, PRN: prn, 0 Refill(s)  CeleXA 40 mg oral tablet: 40 mg, 1 tabs, Oral, Daily, 30 tabs, 0 Refill(s)  Colace 100 mg oral capsule: 100 mg, 1 caps, Oral, BID, PRN: as needed for constipation, 0 Refill(s)  Dulcolax Laxative 5 mg oral delayed release tablet: 5 mg, 1 tabs, Oral, Daily, PRN, 0 Refill(s)  KlonoPIN 0.5 mg oral tablet: 0.5 mg, 1 tabs, Oral, Once a Day (at bedtime), 0 Refill(s)  Proventil HFA 90 mcg/inh inhalation aerosol: 2 puffs, Inhale, q4hr, PRN, 6.7 g, 0 Refill(s)  Remeron 15 mg oral tablet: 15 mg, 1 tabs, Oral, Once a Day (at bedtime), 30 tabs, 0 Refill(s)  cyclobenzaprine 5 mg oral tablet: 5 mg, 1 tabs, Oral, TID, for 14 days, 42 tabs, 0 Refill(s)  fentaNYL 50 mcg/hr transdermal film, extended release: 1 patches, TD, q72hr, 0 Refill(s)  folic acid 1 mg oral tablet: 1 mg, 1 tabs, Oral, Daily, 0 Refill(s)  ibuprofen 800 mg oral tablet: 800 mg, 1 tabs, Oral, TID, PRN: for pain, 30 tabs, 0 Refill(s)  oxyCODONE 30 mg oral tablet: 30 mg, 1 tabs, Oral, q4hr, PRN: for pain, 0 Refill(s)  promethazine 25 mg oral tablet: 25 mg, 1 tabs, Oral, q6hr, PRN: as needed for nausea/vomiting, 0  Refill(s)  sodium bicarbonate 650 mg oral tablet: 325 mg, 0.5 tabs, Oral, BID, 0 Refill(s).      Past Medical/ Family/ Social History   Problem list:    Active Problems (4)  Alteration in comfort: pain   Hypertension   Shortness of breath   Sickle cell   .      Physical Examination               Vital Signs   Vital Signs   07/06/2019 4:48 EDT Systolic Blood Pressure 120 mmHg    Diastolic Blood Pressure 88 mmHg    Temperature Oral 36.8 degC    Heart Rate Monitored 114 bpm  HI    Respiratory Rate 20 br/min  SpO2 99 %   .   Measurements   07/06/2019 4:50 EDT Body Mass Index est meas 22.27 kg/m2    Body Mass Index Measured 22.27 kg/m2   07/06/2019 4:48 EDT Height/Length Measured 160 cm    Weight Dosing 57 kg   .   Basic Oxygen Information   07/06/2019 4:48 EDT SpO2 99 %    Oxygen Therapy Room air   .   General:  No acute distress.   Skin:  No pallor.   Eye:  Vision grossly normal.   Ears, nose, mouth and throat:  Oral mucosa moist.   Cardiovascular:  Normal peripheral perfusion.   Respiratory:  no respiratory distress .   Neurological:  Normal speech observed.   Psychiatric:  Appropriate mood & affect, non-suicidal.       Medical Decision Making   Rationale:  Patient with sickle cell thrombo-occlusive crisis likely secondary to mild dehydration and inability to keep her pain medicine she was initiated on IV therapy she had no evidence of acute chest syndrome she was afebrile as such she was treated pain control with good response thus discharged stable condition given antiemetic prescription for home she will need to see her regular doctor for refill long-term pain medicines.   Documents reviewed:  Emergency department nurses' notes, flowsheet, emergency department records, prior records, vital signs.       Reexamination/ Reevaluation   Vital signs   Basic Oxygen Information   07/06/2019 4:48 EDT SpO2 99 %    Oxygen Therapy Room air         Impression and Plan   Diagnosis   Sickle cell crisis (ICD10-CM D57.00, Discharge,  Medical)   Plan   Condition: Improved.    Disposition: Medically cleared, Discharged: to home.    Patient was given the following educational materials: Sickle Cell Anemia, Adult, Sickle Cell Anemia, Adult.    Follow up with: Follow up with primary care provider Within 1 to 2 days If need assistance in establishing a primary care doctor call [phone].    Notes: The patient was counseled at length by the physician and nursing staff on the disposition  plans well as  any pertinent laboratory and radiology findings  were reviewed with the patient and or family.  They  were informed that there results  may be preliminary and that any  discrepencies  would conveyed  to them via  the contact number they  provided to the registration staff.  SABRA

## 2019-07-06 NOTE — ED Notes (Signed)
ED Triage Note       ED Secondary Triage Entered On:  07/06/2019 4:50 EDT    Performed On:  07/06/2019 4:50 EDT by WILL, RN, SHAYE P               General Information   Barriers to Learning :   None evident   Languages :   English   ED Home Meds Section :   Document assessment   [facility] ED Fall Risk Section :   Document assessment   ED History Section :   Document assessment   ED Advance Directives Section :   Document assessment   ED Palliative Screen :   N/A (prefilled for <65yo)   WILL, RN, SHAYE P - 07/06/2019 4:50 EDT   (As Of: 07/06/2019 04:50:56 EDT)   Problems(Active)    Alteration in comfort: pain (SNOMED CT  :78295621 )  Name of Problem:   Alteration in comfort: pain ; Recorder:   SYSTEM,  SYSTEM; Confirmation:   Confirmed ; Classification:   Interdisciplinary ; Code:   [id number] ; Last Updated:   03/28/2019 2:23 EDT ; Life Cycle Date:   03/28/2019 ; Life Cycle Status:   Active ; Vocabulary:   SNOMED CT   ; Comments:        03/28/2019 2:23 - SYSTEM,  SYSTEM  Problem added automatically by system based on initiation of Alteration in Comfort Plan of Care      Hypertension (IMO  :69629 )  Name of Problem:   Hypertension ; Recorder:   Rock Nephew, RN, JENNETTE; Confirmation:   Confirmed ; Classification:   Medical ; Code:   [phone] ; Contributor System:   Dietitian ; Last Updated:   11/08/2015 5:36 EST ; Life Cycle Date:   11/08/2015 ; Life Cycle Status:   Active ; Vocabulary:   IMO        Shortness of breath (IMO  :32440 )  Name of Problem:   Shortness of breath ; Recorder:   SYSTEM,  SYSTEM; Confirmation:   Confirmed ; Classification:   Patient Stated ; Code:   10272 ; Last Updated:   04/17/2019 15:58 EDT ; Life Cycle Date:   04/17/2019 ; Life Cycle Status:   Active ; Vocabulary:   IMO        Sickle cell (SNOMED CT  :53664403 )  Name of Problem:   Sickle cell ; Recorder:   ALBAYALDE, RN, JENNETTE; Confirmation:   Confirmed ; Classification:   Medical ; Code:   [id number] ; Contributor System:   Dietitian ; Last Updated:    11/08/2015 5:36 EST ; Life Cycle Date:   11/08/2015 ; Life Cycle Status:   Active ; Vocabulary:   SNOMED CT          Diagnoses(Active)    Sickle cell pain  Date:   07/06/2019 ; Diagnosis Type:   Reason For Visit ; Confirmation:   Complaint of ; Clinical Dx:   Sickle cell pain ; Classification:   Medical ; Clinical Service:   Emergency medicine ; Code:   PNED ; Probability:   0 ; Diagnosis Code:   [id number]             -    Procedure History   (As Of: 07/06/2019 04:50:56 EDT)     Anesthesia Minutes:   0 ; Procedure Name:   Port ; Procedure Minutes:   0            Anesthesia Minutes:   0 ;  Procedure Name:   thoracotomy ; Procedure Minutes:   0            Anesthesia Minutes:   0 ; Procedure Name:   hernia repair ; Procedure Minutes:   0            Anesthesia Minutes:   0 ; Procedure Name:   splenectomy ; Procedure Minutes:   0            Anesthesia Minutes:   0 ; Procedure Name:   Cholecystectomy; ; Procedure Minutes:   0            [facility] Fall Risk Assessment Tool   Hx of falling last 3 months ED Fall :   No   Patient confused or disoriented ED Fall :   No   Patient intoxicated or sedated ED Fall :   No   Patient impaired gait ED Fall :   No   Use a mobility assistance device ED Fall :   No   Patient altered elimination ED Fall :   No   [facility] ED Fall Score :   0    WILL, RN, SHAYE P - 07/06/2019 4:50 EDT   ED Advance Directive   Advance Directive :   No   WILL, RN, SHAYE P - 07/06/2019 4:50 EDT   Social History   Social History   (As Of: 07/06/2019 04:50:56 EDT)   Tobacco:        Former smoker   (Last Updated: 11/08/2015 05:40:04 EST by Rock Nephew, RN, JENNETTE)          Alcohol:        Denies   (Last Updated: 03/27/2019 20:00:39 EDT by Justin Mend, RN, GAIL E)          Substance Use:        Opioid Complex, Current, Prescription medications, Marijuana   (Last Updated: 03/27/2019 20:04:05 EDT by Justin Mend, RN, GAIL E)            Med Hx   Medication List   (As Of: 07/06/2019 04:50:56 EDT)   Home Meds     sodium bicarbonate  :   sodium bicarbonate ; Status:   Documented ; Ordered As Mnemonic:   sodium bicarbonate 650 mg oral tablet ; Simple Display Line:   325 mg, 0.5 tabs, Oral, BID, 0 Refill(s) ; Ordering Provider:   Kerin Salen; Catalog Code:   sodium bicarbonate ; Order Dt/Tm:   04/18/2019 14:27:46 EDT          cyclobenzaprine  :   cyclobenzaprine ; Status:   Documented ; Ordered As Mnemonic:   cyclobenzaprine 5 mg oral tablet ; Simple Display Line:   5 mg, 1 tabs, Oral, TID, for 14 days, 42 tabs, 0 Refill(s) ; Catalog Code:   cyclobenzaprine ; Order Dt/Tm:   04/17/2019 19:33:49 EDT ; Comment:   " THIS MEDICATION IS ASSOCIATED   WITH   AN INCREASED RISK OF FALLS."          albuterol  :   albuterol ; Status:   Documented ; Ordered As Mnemonic:   Proventil HFA 90 mcg/inh inhalation aerosol ; Simple Display Line:   2 puffs, Inhale, q4hr, PRN, 6.7 g, 0 Refill(s) ; Catalog Code:   albuterol ; Order Dt/Tm:   03/28/2019 09:31:25 EDT          bisacodyl  :   bisacodyl ; Status:   Documented ; Ordered As Mnemonic:   Dulcolax Laxative 5 mg  oral delayed release tablet ; Simple Display Line:   5 mg, 1 tabs, Oral, Daily, PRN, 0 Refill(s) ; Catalog Code:   bisacodyl ; Order Dt/Tm:   03/28/2019 09:31:31 EDT ; Comment:   DO NOT CRUSH          citalopram  :   citalopram ; Status:   Documented ; Ordered As Mnemonic:   CeleXA 40 mg oral tablet ; Simple Display Line:   40 mg, 1 tabs, Oral, Daily, 30 tabs, 0 Refill(s) ; Catalog Code:   citalopram ; Order Dt/Tm:   03/28/2019 09:26:41 EDT ; Comment:   SOUND ALIKE / LOOK ALIKE - VERIFY DRUG          clonazePAM  :   clonazePAM ; Status:   Documented ; Ordered As Mnemonic:   KlonoPIN 0.5 mg oral tablet ; Simple Display Line:   0.5 mg, 1 tabs, Oral, Once a Day (at bedtime), 0 Refill(s) ; Catalog Code:   clonazePAM ; Order Dt/Tm:   03/28/2019 09:26:46 EDT ; Comment:   SOUND ALIKE / LOOK ALIKE - VERIFY DRUG  SOUND ALIKE / LOOK ALIKE - VERIFY DRUG          diphenhydrAMINE  :    diphenhydrAMINE ; Status:   Documented ; Ordered As Mnemonic:   Benadryl 25 mg oral tablet ; Simple Display Line:   50 mg, 2 tabs, Oral, PRN: prn, 0 Refill(s) ; Catalog Code:   diphenhydrAMINE ; Order Dt/Tm:   03/28/2019 09:31:07 EDT          docusate  :   docusate ; Status:   Documented ; Ordered As Mnemonic:   Colace 100 mg oral capsule ; Simple Display Line:   100 mg, 1 caps, Oral, BID, PRN: as needed for constipation, 0 Refill(s) ; Catalog Code:   docusate ; Order Dt/Tm:   03/28/2019 09:31:40 EDT          fentaNYL  :   fentaNYL ; Status:   Documented ; Ordered As Mnemonic:   fentaNYL 50 mcg/hr transdermal film, extended release ; Simple Display Line:   1 patches, TD, q72hr, 0 Refill(s) ; Catalog Code:   fentaNYL ; Order Dt/Tm:   03/28/2019 09:27:02 EDT          folic acid  :   folic acid ; Status:   Documented ; Ordered As Mnemonic:   folic acid 1 mg oral tablet ; Simple Display Line:   1 mg, 1 tabs, Oral, Daily, 0 Refill(s) ; Catalog Code:   folic acid ; Order Dt/Tm:   03/28/2019 13:24:40 EDT          ibuprofen  :   ibuprofen ; Status:   Documented ; Ordered As Mnemonic:   ibuprofen 800 mg oral tablet ; Simple Display Line:   800 mg, 1 tabs, Oral, TID, PRN: for pain, 30 tabs, 0 Refill(s) ; Catalog Code:   ibuprofen ; Order Dt/Tm:   03/28/2019 09:28:46 EDT          mirtazapine  :   mirtazapine ; Status:   Documented ; Ordered As Mnemonic:   Remeron 15 mg oral tablet ; Simple Display Line:   15 mg, 1 tabs, Oral, Once a Day (at bedtime), 30 tabs, 0 Refill(s) ; Catalog Code:   mirtazapine ; Order Dt/Tm:   03/28/2019 10:27:25 EDT          oxyCODONE  :   oxyCODONE ; Status:   Documented ; Ordered As Mnemonic:   oxyCODONE  30 mg oral tablet ; Simple Display Line:   30 mg, 1 tabs, Oral, q4hr, PRN: for pain, 0 Refill(s) ; Catalog Code:   oxyCODONE ; Order Dt/Tm:   03/28/2019 09:29:01 EDT          promethazine  :   promethazine ; Status:   Documented ; Ordered As Mnemonic:   promethazine 25 mg oral tablet ; Simple Display Line:    25 mg, 1 tabs, Oral, q6hr, PRN: as needed for nausea/vomiting, 0 Refill(s) ; Catalog Code:   promethazine ; Order Dt/Tm:   03/28/2019 09:32:47 EDT

## 2019-07-06 NOTE — ED Notes (Signed)
ED Patient Education Note     Patient Education Materials Follows:  Home Health Care     Sickle Cell Anemia, Adult    Sickle cell anemia is a condition in which red blood cells have an abnormal "sickle" shape. This abnormal shape shortens the cells' life span, which results in a lower than normal concentration of red blood cells in the blood. The sickle shape also causes the cells to clump together and block free blood flow through the blood vessels. As a result, the tissues and organs of the body do not receive enough oxygen. Sickle cell anemia causes organ damage and pain and increases the risk of infection.      CAUSES    Sickle cell anemia is a genetic disorder. Those who receive two copies of the gene have the condition, and those who receive one copy have the trait.    RISK FACTORS    The sickle cell gene is most common in people whose families originated in [place]. Other areas of the globe where sickle cell trait occurs include the [place], [place], the [place], and the [place].     SIGNS AND SYMPTOMS     Pain, especially in the extremities, back, chest, or abdomen (common). The pain may start suddenly or may develop following an illness, especially if there is dehydration. Pain can also occur due to overexertion or exposure to extreme temperature changes.     Frequent severe bacterial infections, especially certain types of pneumonia and meningitis.     Pain and swelling in the hands and feet.     Decreased activity. ?     Loss of appetite. ?     Change in behavior.      Headaches.     Seizures.     Shortness of breath or difficulty breathing.     Vision changes.     Skin ulcers.    Those with the trait may not have symptoms or they may have mild symptoms.     DIAGNOSIS    Sickle cell anemia is diagnosed with blood tests that demonstrate the genetic trait. It is often diagnosed during the newborn period, due to mandatory testing nationwide. A variety of blood tests, X-rays, CT  scans, MRI scans, ultrasounds, and lung function tests may also be done to monitor the condition.    TREATMENT    Sickle cell anemia may be treated with:     Medicines. You may be given pain medicines, antibiotic medicines (to treat and prevent infections) or medicines to increase the production of certain types of hemoglobin.     Fluids.     Oxygen.     Blood transfusions.    HOME CARE INSTRUCTIONS     Drink enough fluid to keep your urine clear or pale yellow. Increase your fluid intake in hot weather and during exercise.      Do not smoke. Smoking lowers oxygen levels in the blood. ?     Only take over-the-counter or prescription medicines for pain, fever, or discomfort as directed by your health care provider.     Take antibiotics as directed by your health care provider. Make sure you finish them it even if you start to feel better. ?     Take supplements as directed by your health care provider. ?     Consider wearing a medical alert bracelet. This tells anyone caring for you in an emergency of your condition. ?     When traveling,  keep your medical information, health care provider's names, and the medicines you take with you at all times. ?     If you develop a fever, do not take medicines to reduce the fever right away. This could cover up a problem that is developing. Notify your health care provider.      Keep all follow-up appointments with your health care provider. Sickle cell anemia requires regular medical care.     SEEK MEDICAL CARE IF:    ?You have a fever.    SEEK IMMEDIATE MEDICAL CARE IF:     You feel dizzy or faint. ?     You have new abdominal pain, especially on the left side near the stomach area. ?     You develop a persistent, often uncomfortable and painful penile erection (priapism). If this is not treated immediately it will lead to impotence. ?     You have numbness your arms or legs or you have a hard time moving them. ?     You have a hard time with speech. ?     You have a fever or  persistent symptoms for more than 2?3 days. ?     You have a fever and your symptoms suddenly get worse. ?     You have signs or symptoms of infection. These include: ?    ? Chills. ?    ? Abnormal tiredness (lethargy). ?    ? Irritability. ?    ? Poor eating. ?    ? Vomiting. ?     You develop pain that is not helped with medicine. ?     You develop shortness of breath.     You have pain in your chest. ?     You are coughing up pus-like or bloody sputum. ?     You develop a stiff neck.     Your feet or hands swell or have pain.     Your abdomen appears bloated.     You develop joint pain.    MAKE SURE YOU:     Understand these instructions.    This information is not intended to replace advice given to you by your health care provider. Make sure you discuss any questions you have with your health care provider.    Document Released: 01/31/2006 Document Revised: 11/13/2014 Document Reviewed: 06/04/2013  Elsevier Interactive Patient Education ?2016 [employer]

## 2019-07-24 LAB — BASIC METABOLIC PANEL
Anion Gap: 12 mmol/L (ref 2–17)
BUN: 8 mg/dL (ref 6–20)
CO2: 16 mmol/L — ABNORMAL LOW (ref 22–29)
Calcium: 8.7 mg/dL (ref 8.6–10.0)
Chloride: 112 mmol/L — ABNORMAL HIGH (ref 98–107)
Creatinine: 0.5 mg/dL (ref 0.5–0.9)
GFR African American: 146 mL/min/{1.73_m2} (ref >=90)
GFR Non-African American: 126 mL/min/{1.73_m2} (ref >=90)
Glucose: 96 mg/dL (ref 70–99)
OSMOLALITY CALCULATED: 278 mOsm/kg (ref 270–287)
Potassium: 3.6 mmol/L (ref 3.5–5.3)
Sodium: 140 mmol/L (ref 135–145)

## 2019-07-24 LAB — CBC
Hematocrit: 21.7 % — ABNORMAL LOW (ref 34.0–47.0)
Hemoglobin: 7.4 g/dL — ABNORMAL LOW (ref 11.5–15.7)
MCH: 28.5 pg (ref 27.0–34.5)
MCHC: 34.1 g/dL (ref 32.0–36.0)
MCV: 83.5 fL (ref 81.0–99.0)
MPV: 9.9 fL (ref 7.2–13.2)
NRBC Absolute: 0 10*3/uL (ref 0.000–0.012)
NRBC Automated: 0 % (ref 0.0–0.2)
Platelets: 582 10*3/uL — ABNORMAL HIGH (ref 140–440)
RBC: 2.6 x10e6/mcL — ABNORMAL LOW (ref 3.60–5.20)
RDW: 17.1 % — ABNORMAL HIGH (ref 11.0–16.0)
WBC: 14.5 10*3/uL — ABNORMAL HIGH (ref 3.8–10.6)

## 2019-07-24 LAB — RETICULOCYTES
RBC: 2.6 x10e6/mcL — ABNORMAL LOW (ref 3.60–5.20)
Retic Ct Abs: 0.141 /mcL — ABNORMAL HIGH (ref 0.0210–0.1080)
Retic Ct Pct: 5.3 % — ABNORMAL HIGH (ref 0.5–2.0)

## 2019-07-24 NOTE — Discharge Summary (Signed)
ED Clinical Summary                     [facility]  [address]  [place], [place] [address]  [phone]          PERSON INFORMATION  Name: Judith [name], Judith [name] Age:  33 Years DOB: 07/20/1986   Sex: Female Language: English PCP: PCP,  NONE   Marital Status: Single Phone: [phone] Med Service: MED-Medicine   MRN: [id number] Acct# [id number] Arrival: 07/24/2019 10:27:00   Visit Reason: Sickle cell pain; SICKLE CELL PAIN Acuity: 3 LOS: 000 02:41   Address:    [address] [facility] [address]   Diagnosis:    Sickle cell pain crisis  Medications:          Medications that have not changed  Other Medications  albuterol (Proventil HFA 90 mcg/inh inhalation aerosol) 2 Puffs Inhale (breathe in) every 4 hours as needed.  Last Dose:____________________  bisacodyl (Dulcolax Laxative 5 mg oral delayed release tablet) 1 Tabs Oral (given by mouth) every day as needed., DO NOT CRUSH  Last Dose:____________________  citalopram (CeleXA 40 mg oral tablet) 1 Tabs Oral (given by mouth) every day., SOUND ALIKE / LOOK ALIKE - VERIFY DRUG  Last Dose:____________________  clonazePAM (KlonoPIN 0.5 mg oral tablet) 1 Tabs Oral (given by mouth) Once a Day (at bedtime)., SOUND ALIKE / LOOK ALIKE - VERIFY DRUG   SOUND ALIKE / LOOK ALIKE - VERIFY DRUG  Last Dose:____________________  cyclobenzaprine (cyclobenzaprine 5 mg oral tablet) 1 Tabs Oral (given by mouth) 3 times a day for 14 Days., " THIS MEDICATION IS ASSOCIATED  WITH  AN INCREASED RISK OF FALLS."  Last Dose:____________________  diphenhydrAMINE (Benadryl 25 mg oral tablet) 2 Tabs Oral (given by mouth) as needed prn.  Last Dose:____________________  docusate (Colace 100 mg oral capsule) 1 Capsules Oral (given by mouth) 2 times a day as needed as needed for constipation.  Last Dose:____________________  fentaNYL (fentaNYL 50 mcg/hr transdermal film, extended release) 1 Patches Transdermal (apply on the skin) every 72 hours.  Last  Dose:____________________  folic acid (folic acid 1 mg oral tablet) 1 Tabs Oral (given by mouth) every day.  Last Dose:____________________  ibuprofen (ibuprofen 800 mg oral tablet) 1 Tabs Oral (given by mouth) 3 times a day as needed for pain.  Last Dose:____________________  mirtazapine (Remeron 15 mg oral tablet) 1 Tabs Oral (given by mouth) Once a Day (at bedtime).  Last Dose:____________________  oxyCODONE (oxyCODONE 30 mg oral tablet) 1 Tabs Oral (given by mouth) every 4 hours as needed for pain.  Last Dose:____________________  promethazine (promethazine 25 mg oral tablet) 1 Tabs Oral (given by mouth) every 6 hours as needed as needed for nausea/vomiting.  Last Dose:____________________  sodium bicarbonate (sodium bicarbonate 650 mg oral tablet) 0.5 Tabs Oral (given by mouth) 2 times a day.  Last Dose:____________________      Medications Administered During Visit:                Medication Dose Route   Sodium Chloride 0.9% 1000 mL IV Piggyback   hydromorphone 1 mg IV Push   promethazine 6.25 mg IV Push   ketorolac 15 mg IV Push   diphenhydrAMINE 12.5 mg IV Push   hydromorphone 2 mg Oral               Allergies      Rocephin      fentaNYL  Female     DOB:  1986/04/18   Associated Diagnoses:   Sickle cell pain crisis   Author:   Alexia Freestone      Basic Information   Time seen: Provider Seen (ST)   ED Provider/Time:    COOK-MD,  DAVID Maisie Fus / 07/24/2019 10:44  .   Patient information:: Chief Complaint from Nursing Triage Note : Chief Complaint   07/24/2019 10:35 EDT      Chief Complaint           Pt c/o n/v/d  x3 days. Pt states she thinks she in in a sickle cell crisis  .   History source: Patient.      History of Present Illness   The patient presents to the emergency department with reported left arm pain and upper back pain as well as nausea, vomiting, and diarrhea over the past 2 to 3 days.  The patient equates these symptoms to her typical sickle cell pain crisis symptoms, noting that she typically has pain in the upper back and upper extremities more in the lower extremities.  She also notes that her left arm was swollen yesterday, but the swelling has resolved today.  She denies fever, dyspnea, palpitations, or any other associated symptoms.  She does note that weather change is often a trigger of pain crises for her, and it has been cold and rainy for the past couple days.  She is followed by hematology at [facility] (Dr. Daphine Deutscher), and notes that she has an appointment there tomorrow.  Her typical outpatient regimen is 30 mg oxycodone 3-4 times a day as well as end-stage, as needed, for pain relief.  She notes that she has been taking that medication without significant relief.      Review of Systems   Constitutional symptoms:  No fever, no chills.    Skin symptoms:  No rash,    Respiratory symptoms:  No shortness of breath, no cough.    Cardiovascular symptoms:  No chest pain, no palpitations.    Gastrointestinal symptoms:  Nausea, vomiting, diarrhea, No abdominal pain,    Musculoskeletal symptoms:  Back pain, left arm pain.    Neurologic symptoms:  No headache, no dizziness, no numbness, no tingling, no focal weakness.              Additional review of systems information: All other systems reviewed and otherwise negative.      Health Status   Allergies:    Allergic Reactions (Selected)  Moderate  Bactrim- Itching.  Unknown  Contrast media (iodine-based)- No reactions were documented.  FentaNYL- No reactions were documented.  Morphine- No reactions were documented.  Rocephin- No reactions were documented..    Medications:  (Selected)   Inpatient Medications  Ordered  Dilaudid: 2 mg, 1 tabs, Oral, Once  Documented Medications  Documented  Benadryl 25 mg oral tablet: 50 mg, 2 tabs, Oral, PRN: prn, 0 Refill(s)  CeleXA 40 mg oral tablet: 40 mg, 1 tabs, Oral, Daily, 30 tabs, 0 Refill(s)  Colace 100 mg oral capsule: 100 mg, 1 caps, Oral, BID, PRN: as needed for constipation, 0 Refill(s)  Dulcolax Laxative 5 mg oral delayed release tablet: 5 mg, 1 tabs, Oral, Daily, PRN, 0 Refill(s)  KlonoPIN 0.5 mg oral tablet: 0.5 mg, 1 tabs, Oral, Once a Day (at bedtime), 0 Refill(s)  Proventil HFA 90 mcg/inh inhalation aerosol: 2 puffs, Inhale, q4hr, PRN, 6.7 g, 0 Refill(s)  Remeron 15 mg oral tablet: 15 mg, 1 tabs, Oral, Once  ED Clinical Summary                     [facility]  [address]  [place], [place] [address]  [phone]          PERSON INFORMATION  Name: Judith [name], Judith [name] Age:  33 Years DOB: 07/20/1986   Sex: Female Language: English PCP: PCP,  NONE   Marital Status: Single Phone: [phone] Med Service: MED-Medicine   MRN: [id number] Acct# [id number] Arrival: 07/24/2019 10:27:00   Visit Reason: Sickle cell pain; SICKLE CELL PAIN Acuity: 3 LOS: 000 02:41   Address:    [address] [facility] [address]   Diagnosis:    Sickle cell pain crisis  Medications:          Medications that have not changed  Other Medications  albuterol (Proventil HFA 90 mcg/inh inhalation aerosol) 2 Puffs Inhale (breathe in) every 4 hours as needed.  Last Dose:____________________  bisacodyl (Dulcolax Laxative 5 mg oral delayed release tablet) 1 Tabs Oral (given by mouth) every day as needed., DO NOT CRUSH  Last Dose:____________________  citalopram (CeleXA 40 mg oral tablet) 1 Tabs Oral (given by mouth) every day., SOUND ALIKE / LOOK ALIKE - VERIFY DRUG  Last Dose:____________________  clonazePAM (KlonoPIN 0.5 mg oral tablet) 1 Tabs Oral (given by mouth) Once a Day (at bedtime)., SOUND ALIKE / LOOK ALIKE - VERIFY DRUG   SOUND ALIKE / LOOK ALIKE - VERIFY DRUG  Last Dose:____________________  cyclobenzaprine (cyclobenzaprine 5 mg oral tablet) 1 Tabs Oral (given by mouth) 3 times a day for 14 Days., " THIS MEDICATION IS ASSOCIATED  WITH  AN INCREASED RISK OF FALLS."  Last Dose:____________________  diphenhydrAMINE (Benadryl 25 mg oral tablet) 2 Tabs Oral (given by mouth) as needed prn.  Last Dose:____________________  docusate (Colace 100 mg oral capsule) 1 Capsules Oral (given by mouth) 2 times a day as needed as needed for constipation.  Last Dose:____________________  fentaNYL (fentaNYL 50 mcg/hr transdermal film, extended release) 1 Patches Transdermal (apply on the skin) every 72 hours.  Last  Dose:____________________  folic acid (folic acid 1 mg oral tablet) 1 Tabs Oral (given by mouth) every day.  Last Dose:____________________  ibuprofen (ibuprofen 800 mg oral tablet) 1 Tabs Oral (given by mouth) 3 times a day as needed for pain.  Last Dose:____________________  mirtazapine (Remeron 15 mg oral tablet) 1 Tabs Oral (given by mouth) Once a Day (at bedtime).  Last Dose:____________________  oxyCODONE (oxyCODONE 30 mg oral tablet) 1 Tabs Oral (given by mouth) every 4 hours as needed for pain.  Last Dose:____________________  promethazine (promethazine 25 mg oral tablet) 1 Tabs Oral (given by mouth) every 6 hours as needed as needed for nausea/vomiting.  Last Dose:____________________  sodium bicarbonate (sodium bicarbonate 650 mg oral tablet) 0.5 Tabs Oral (given by mouth) 2 times a day.  Last Dose:____________________      Medications Administered During Visit:                Medication Dose Route   Sodium Chloride 0.9% 1000 mL IV Piggyback   hydromorphone 1 mg IV Push   promethazine 6.25 mg IV Push   ketorolac 15 mg IV Push   diphenhydrAMINE 12.5 mg IV Push   hydromorphone 2 mg Oral               Allergies      Rocephin      fentaNYL  Female     DOB:  1986/04/18   Associated Diagnoses:   Sickle cell pain crisis   Author:   Alexia Freestone      Basic Information   Time seen: Provider Seen (ST)   ED Provider/Time:    COOK-MD,  DAVID Maisie Fus / 07/24/2019 10:44  .   Patient information:: Chief Complaint from Nursing Triage Note : Chief Complaint   07/24/2019 10:35 EDT      Chief Complaint           Pt c/o n/v/d  x3 days. Pt states she thinks she in in a sickle cell crisis  .   History source: Patient.      History of Present Illness   The patient presents to the emergency department with reported left arm pain and upper back pain as well as nausea, vomiting, and diarrhea over the past 2 to 3 days.  The patient equates these symptoms to her typical sickle cell pain crisis symptoms, noting that she typically has pain in the upper back and upper extremities more in the lower extremities.  She also notes that her left arm was swollen yesterday, but the swelling has resolved today.  She denies fever, dyspnea, palpitations, or any other associated symptoms.  She does note that weather change is often a trigger of pain crises for her, and it has been cold and rainy for the past couple days.  She is followed by hematology at [facility] (Dr. Daphine Deutscher), and notes that she has an appointment there tomorrow.  Her typical outpatient regimen is 30 mg oxycodone 3-4 times a day as well as end-stage, as needed, for pain relief.  She notes that she has been taking that medication without significant relief.      Review of Systems   Constitutional symptoms:  No fever, no chills.    Skin symptoms:  No rash,    Respiratory symptoms:  No shortness of breath, no cough.    Cardiovascular symptoms:  No chest pain, no palpitations.    Gastrointestinal symptoms:  Nausea, vomiting, diarrhea, No abdominal pain,    Musculoskeletal symptoms:  Back pain, left arm pain.    Neurologic symptoms:  No headache, no dizziness, no numbness, no tingling, no focal weakness.              Additional review of systems information: All other systems reviewed and otherwise negative.      Health Status   Allergies:    Allergic Reactions (Selected)  Moderate  Bactrim- Itching.  Unknown  Contrast media (iodine-based)- No reactions were documented.  FentaNYL- No reactions were documented.  Morphine- No reactions were documented.  Rocephin- No reactions were documented..    Medications:  (Selected)   Inpatient Medications  Ordered  Dilaudid: 2 mg, 1 tabs, Oral, Once  Documented Medications  Documented  Benadryl 25 mg oral tablet: 50 mg, 2 tabs, Oral, PRN: prn, 0 Refill(s)  CeleXA 40 mg oral tablet: 40 mg, 1 tabs, Oral, Daily, 30 tabs, 0 Refill(s)  Colace 100 mg oral capsule: 100 mg, 1 caps, Oral, BID, PRN: as needed for constipation, 0 Refill(s)  Dulcolax Laxative 5 mg oral delayed release tablet: 5 mg, 1 tabs, Oral, Daily, PRN, 0 Refill(s)  KlonoPIN 0.5 mg oral tablet: 0.5 mg, 1 tabs, Oral, Once a Day (at bedtime), 0 Refill(s)  Proventil HFA 90 mcg/inh inhalation aerosol: 2 puffs, Inhale, q4hr, PRN, 6.7 g, 0 Refill(s)  Remeron 15 mg oral tablet: 15 mg, 1 tabs, Oral, Once  ED Clinical Summary                     [facility]  [address]  [place], [place] [address]  [phone]          PERSON INFORMATION  Name: Judith [name], Judith [name] Age:  33 Years DOB: 07/20/1986   Sex: Female Language: English PCP: PCP,  NONE   Marital Status: Single Phone: [phone] Med Service: MED-Medicine   MRN: [id number] Acct# [id number] Arrival: 07/24/2019 10:27:00   Visit Reason: Sickle cell pain; SICKLE CELL PAIN Acuity: 3 LOS: 000 02:41   Address:    [address] [facility] [address]   Diagnosis:    Sickle cell pain crisis  Medications:          Medications that have not changed  Other Medications  albuterol (Proventil HFA 90 mcg/inh inhalation aerosol) 2 Puffs Inhale (breathe in) every 4 hours as needed.  Last Dose:____________________  bisacodyl (Dulcolax Laxative 5 mg oral delayed release tablet) 1 Tabs Oral (given by mouth) every day as needed., DO NOT CRUSH  Last Dose:____________________  citalopram (CeleXA 40 mg oral tablet) 1 Tabs Oral (given by mouth) every day., SOUND ALIKE / LOOK ALIKE - VERIFY DRUG  Last Dose:____________________  clonazePAM (KlonoPIN 0.5 mg oral tablet) 1 Tabs Oral (given by mouth) Once a Day (at bedtime)., SOUND ALIKE / LOOK ALIKE - VERIFY DRUG   SOUND ALIKE / LOOK ALIKE - VERIFY DRUG  Last Dose:____________________  cyclobenzaprine (cyclobenzaprine 5 mg oral tablet) 1 Tabs Oral (given by mouth) 3 times a day for 14 Days., " THIS MEDICATION IS ASSOCIATED  WITH  AN INCREASED RISK OF FALLS."  Last Dose:____________________  diphenhydrAMINE (Benadryl 25 mg oral tablet) 2 Tabs Oral (given by mouth) as needed prn.  Last Dose:____________________  docusate (Colace 100 mg oral capsule) 1 Capsules Oral (given by mouth) 2 times a day as needed as needed for constipation.  Last Dose:____________________  fentaNYL (fentaNYL 50 mcg/hr transdermal film, extended release) 1 Patches Transdermal (apply on the skin) every 72 hours.  Last  Dose:____________________  folic acid (folic acid 1 mg oral tablet) 1 Tabs Oral (given by mouth) every day.  Last Dose:____________________  ibuprofen (ibuprofen 800 mg oral tablet) 1 Tabs Oral (given by mouth) 3 times a day as needed for pain.  Last Dose:____________________  mirtazapine (Remeron 15 mg oral tablet) 1 Tabs Oral (given by mouth) Once a Day (at bedtime).  Last Dose:____________________  oxyCODONE (oxyCODONE 30 mg oral tablet) 1 Tabs Oral (given by mouth) every 4 hours as needed for pain.  Last Dose:____________________  promethazine (promethazine 25 mg oral tablet) 1 Tabs Oral (given by mouth) every 6 hours as needed as needed for nausea/vomiting.  Last Dose:____________________  sodium bicarbonate (sodium bicarbonate 650 mg oral tablet) 0.5 Tabs Oral (given by mouth) 2 times a day.  Last Dose:____________________      Medications Administered During Visit:                Medication Dose Route   Sodium Chloride 0.9% 1000 mL IV Piggyback   hydromorphone 1 mg IV Push   promethazine 6.25 mg IV Push   ketorolac 15 mg IV Push   diphenhydrAMINE 12.5 mg IV Push   hydromorphone 2 mg Oral               Allergies      Rocephin      fentaNYL  ED Clinical Summary                     [facility]  [address]  [place], [place] [address]  [phone]          PERSON INFORMATION  Name: Judith [name], Judith [name] Age:  33 Years DOB: 07/20/1986   Sex: Female Language: English PCP: PCP,  NONE   Marital Status: Single Phone: [phone] Med Service: MED-Medicine   MRN: [id number] Acct# [id number] Arrival: 07/24/2019 10:27:00   Visit Reason: Sickle cell pain; SICKLE CELL PAIN Acuity: 3 LOS: 000 02:41   Address:    [address] [facility] [address]   Diagnosis:    Sickle cell pain crisis  Medications:          Medications that have not changed  Other Medications  albuterol (Proventil HFA 90 mcg/inh inhalation aerosol) 2 Puffs Inhale (breathe in) every 4 hours as needed.  Last Dose:____________________  bisacodyl (Dulcolax Laxative 5 mg oral delayed release tablet) 1 Tabs Oral (given by mouth) every day as needed., DO NOT CRUSH  Last Dose:____________________  citalopram (CeleXA 40 mg oral tablet) 1 Tabs Oral (given by mouth) every day., SOUND ALIKE / LOOK ALIKE - VERIFY DRUG  Last Dose:____________________  clonazePAM (KlonoPIN 0.5 mg oral tablet) 1 Tabs Oral (given by mouth) Once a Day (at bedtime)., SOUND ALIKE / LOOK ALIKE - VERIFY DRUG   SOUND ALIKE / LOOK ALIKE - VERIFY DRUG  Last Dose:____________________  cyclobenzaprine (cyclobenzaprine 5 mg oral tablet) 1 Tabs Oral (given by mouth) 3 times a day for 14 Days., " THIS MEDICATION IS ASSOCIATED  WITH  AN INCREASED RISK OF FALLS."  Last Dose:____________________  diphenhydrAMINE (Benadryl 25 mg oral tablet) 2 Tabs Oral (given by mouth) as needed prn.  Last Dose:____________________  docusate (Colace 100 mg oral capsule) 1 Capsules Oral (given by mouth) 2 times a day as needed as needed for constipation.  Last Dose:____________________  fentaNYL (fentaNYL 50 mcg/hr transdermal film, extended release) 1 Patches Transdermal (apply on the skin) every 72 hours.  Last  Dose:____________________  folic acid (folic acid 1 mg oral tablet) 1 Tabs Oral (given by mouth) every day.  Last Dose:____________________  ibuprofen (ibuprofen 800 mg oral tablet) 1 Tabs Oral (given by mouth) 3 times a day as needed for pain.  Last Dose:____________________  mirtazapine (Remeron 15 mg oral tablet) 1 Tabs Oral (given by mouth) Once a Day (at bedtime).  Last Dose:____________________  oxyCODONE (oxyCODONE 30 mg oral tablet) 1 Tabs Oral (given by mouth) every 4 hours as needed for pain.  Last Dose:____________________  promethazine (promethazine 25 mg oral tablet) 1 Tabs Oral (given by mouth) every 6 hours as needed as needed for nausea/vomiting.  Last Dose:____________________  sodium bicarbonate (sodium bicarbonate 650 mg oral tablet) 0.5 Tabs Oral (given by mouth) 2 times a day.  Last Dose:____________________      Medications Administered During Visit:                Medication Dose Route   Sodium Chloride 0.9% 1000 mL IV Piggyback   hydromorphone 1 mg IV Push   promethazine 6.25 mg IV Push   ketorolac 15 mg IV Push   diphenhydrAMINE 12.5 mg IV Push   hydromorphone 2 mg Oral               Allergies      Rocephin      fentaNYL

## 2019-07-24 NOTE — ED Notes (Signed)
ED Patient Summary       ;       [facility] Emergency Department  [address], [place] [address]  [phone]  Discharge Instructions (Patient)  _______________________________________     Name: Judith [name], Judith [name]  DOB: 1985-12-08                   MRN: [id number]                   FIN: [phone]  Reason For Visit: Sickle cell pain; SICKLE CELL PAIN  Final Diagnosis: Sickle cell pain crisis     Visit Date: 07/24/2019 10:27:00  Address: [address] [place] [facility] [address]  Phone: [phone]     Primary Care Provider:      Name: PCP,  NONE      Phone:         Emergency Department Providers:         Primary Physician:   COOK-MD, DAVID Earnie Larsson would like to thank you for allowing [place] to assist you with your healthcare needs. The following includes patient education materials and information regarding your injury/illness.     Follow-up Instructions:  You were treated today on an emergency basis. If instructed, please contact your primary care provider to arrange for follow-up and for any further concerns. Whether you have been referred to your primary care doctor or a specialist, please follow-up as instructed.      If you do not have a doctor, you may call [phone] for assistance with finding a [facility] care physician or specialist. Staff is available to help schedule you an appointment.      Not sure where to go with questions about your health? We're here for you. The [facility] "Ask a Nurse" Line in staffed by experienced nurses and is a free service to the community, available Monday - Friday from 8AM to 5PM. Call [phone].      If your condition worsens before your follow-up with an outpatient physician, please return to the Emergency Department.              With: Address: When:   Jackquline Denmark, View Only Providers - No Access [address] [place], [place] [address]  [phone] Business (1) Within 1 week    Comments:   Please follow-up, as instructed. Return to the Emergency Department, as needed, for any new/worsening symptoms or further concerns.    If you do not have a primary care physician or need assistance finding any other type of physician, you may call [phone] for assistance.    Continue your current home medications, as already prescribed.              Printed Prescriptions:    Patient Education Materials:  Discharge Orders          Discharge Patient 07/24/19 12:41:00 EDT         Comment:      Sickle Cell Anemia, Adult     Sickle Cell Anemia, Adult    Sickle cell anemia is a condition in which red blood cells have an abnormal "sickle" shape. This abnormal shape shortens the cells' life span, which results in a lower than normal concentration of red blood cells in the blood. The sickle shape also causes the cells to clump together and block free blood flow through the blood vessels.  As a result, the tissues and organs of the body do not receive enough oxygen. Sickle cell anemia causes organ damage and pain and increases the risk of infection.      CAUSES    Sickle cell anemia is a genetic disorder. Those who receive two copies of the gene have the condition, and those who receive one copy have the trait.    RISK FACTORS    The sickle cell gene is most common in people whose families originated in [place]. Other areas of the globe where sickle cell trait occurs include the [place], [place], the [place], and the [place].     SIGNS AND SYMPTOMS     Pain, especially in the extremities, back, chest, or abdomen (common). The pain may start suddenly or may develop following an illness, especially if there is dehydration. Pain can also occur due to overexertion or exposure to extreme temperature changes.     Frequent severe bacterial infections, especially certain types of pneumonia and meningitis.     Pain and swelling in the hands and feet.     Decreased activity. ?     Loss of  appetite. ?     Change in behavior.      Headaches.     Seizures.     Shortness of breath or difficulty breathing.     Vision changes.     Skin ulcers.    Those with the trait may not have symptoms or they may have mild symptoms.     DIAGNOSIS    Sickle cell anemia is diagnosed with blood tests that demonstrate the genetic trait. It is often diagnosed during the newborn period, due to mandatory testing nationwide. A variety of blood tests, X-rays, CT scans, MRI scans, ultrasounds, and lung function tests may also be done to monitor the condition.    TREATMENT    Sickle cell anemia may be treated with:     Medicines. You may be given pain medicines, antibiotic medicines (to treat and prevent infections) or medicines to increase the production of certain types of hemoglobin.     Fluids.     Oxygen.     Blood transfusions.    HOME CARE INSTRUCTIONS     Drink enough fluid to keep your urine clear or pale yellow. Increase your fluid intake in hot weather and during exercise.      Do not smoke. Smoking lowers oxygen levels in the blood. ?     Only take over-the-counter or prescription medicines for pain, fever, or discomfort as directed by your health care provider.     Take antibiotics as directed by your health care provider. Make sure you finish them it even if you start to feel better. ?     Take supplements as directed by your health care provider. ?     Consider wearing a medical alert bracelet. This tells anyone caring for you in an emergency of your condition. ?     When traveling, keep your medical information, health care provider's names, and the medicines you take with you at all times. ?     If you develop a fever, do not take medicines to reduce the fever right away. This could cover up a problem that is developing. Notify your health care provider.      Keep all follow-up appointments with your health care provider. Sickle cell anemia requires regular medical care.     SEEK MEDICAL CARE IF:    ?You  have a  fever.    SEEK IMMEDIATE MEDICAL CARE IF:     You feel dizzy or faint. ?     You have new abdominal pain, especially on the left side near the stomach area. ?     You develop a persistent, often uncomfortable and painful penile erection (priapism). If this is not treated immediately it will lead to impotence. ?     You have numbness your arms or legs or you have a hard time moving them. ?     You have a hard time with speech. ?     You have a fever or persistent symptoms for more than 2?3 days. ?     You have a fever and your symptoms suddenly get worse. ?     You have signs or symptoms of infection. These include: ?    ? Chills. ?    ? Abnormal tiredness (lethargy). ?    ? Irritability. ?    ? Poor eating. ?    ? Vomiting. ?     You develop pain that is not helped with medicine. ?     You develop shortness of breath.     You have pain in your chest. ?     You are coughing up pus-like or bloody sputum. ?     You develop a stiff neck.     Your feet or hands swell or have pain.     Your abdomen appears bloated.     You develop joint pain.    MAKE SURE YOU:     Understand these instructions.    This information is not intended to replace advice given to you by your health care provider. Make sure you discuss any questions you have with your health care provider.    Document Released: 01/31/2006 Document Revised: 11/13/2014 Document Reviewed: 06/04/2013  Elsevier Interactive Patient Education ?2016 Elsevier Inc.         Allergy Info: fentaNYL; contrast media (iodine-based); Rocephin; Bactrim; morphine     Medication Information:  [facility] ED Physicians provided you with a complete list of medications post discharge, if you have been instructed to stop taking a medication please ensure you also follow up with this information to your Primary Care Physician.  Unless otherwise noted, patient will continue to take medications as prescribed prior to the Emergency Room visit.  Any specific questions regarding your  chronic medications and dosages should be discussed with your physician(s) and pharmacist.          albuterol (Proventil HFA 90 mcg/inh inhalation aerosol) 2 Puffs Inhale (breathe in) every 4 hours as needed.  bisacodyl (Dulcolax Laxative 5 mg oral delayed release tablet) 1 Tabs Oral (given by mouth) every day as needed., DO NOT CRUSH  citalopram (CeleXA 40 mg oral tablet) 1 Tabs Oral (given by mouth) every day., SOUND ALIKE / LOOK ALIKE - VERIFY DRUG  clonazePAM (KlonoPIN 0.5 mg oral tablet) 1 Tabs Oral (given by mouth) Once a Day (at bedtime)., SOUND ALIKE / LOOK ALIKE - VERIFY DRUG   SOUND ALIKE / LOOK ALIKE - VERIFY DRUG  cyclobenzaprine (cyclobenzaprine 5 mg oral tablet) 1 Tabs Oral (given by mouth) 3 times a day for 14 Days., " THIS MEDICATION IS ASSOCIATED  WITH  AN INCREASED RISK OF FALLS."  diphenhydrAMINE (Benadryl 25 mg oral tablet) 2 Tabs Oral (given by mouth) as needed prn.  docusate (Colace 100 mg oral capsule) 1 Capsules Oral (given by mouth) 2 times  a day as needed as needed for constipation.  fentaNYL (fentaNYL 50 mcg/hr transdermal film, extended release) 1 Patches Transdermal (apply on the skin) every 72 hours.  folic acid (folic acid 1 mg oral tablet) 1 Tabs Oral (given by mouth) every day.  ibuprofen (ibuprofen 800 mg oral tablet) 1 Tabs Oral (given by mouth) 3 times a day as needed for pain.  mirtazapine (Remeron 15 mg oral tablet) 1 Tabs Oral (given by mouth) Once a Day (at bedtime).  oxyCODONE (oxyCODONE 30 mg oral tablet) 1 Tabs Oral (given by mouth) every 4 hours as needed for pain.  promethazine (promethazine 25 mg oral tablet) 1 Tabs Oral (given by mouth) every 6 hours as needed as needed for nausea/vomiting.  sodium bicarbonate (sodium bicarbonate 650 mg oral tablet) 0.5 Tabs Oral (given by mouth) 2 times a day.      Medications Administered During Visit:              Medication Dose Route   Sodium Chloride 0.9% 1000 mL IV Piggyback   hydromorphone 1 mg IV Push   promethazine 6.25 mg IV  Push   ketorolac 15 mg IV Push   diphenhydrAMINE 12.5 mg IV Push   hydromorphone 2 mg Oral          Major Tests and Procedures:  The following procedures and tests were performed during your ED visit.  COMMON PROCEDURES%>  COMMON PROCEDURES COMMENTS%>          Laboratory Orders  Name Status Details   BMP Completed Blood, Stat, ST - Stat, 07/24/19 10:51:00 EDT, 07/24/19 10:51:00 EDT, Nurse collect, COOK-MD,  DAVID THOMAS, Print label Y/N   CBC Completed Blood, Stat, ST - Stat, 07/24/19 10:51:00 EDT, 07/24/19 10:51:00 EDT, Nurse collect, COOK-MD,  DAVID THOMAS, Print label Y/N   Retic Auto Completed Blood, Stat, ST - Stat, 07/24/19 10:51:00 EDT, 07/24/19 10:51:00 EDT, Nurse collect, COOK-MD,  DAVID Maisie Fus, Print label Y/N   Joselyn Glassman Completed Blood, Stat, ST - Stat, Collected, 07/24/19 11:45:00 EDT [id number], 07/24/19 11:45:00 EDT, Nurse collect, Venous Draw, 07/24/19 11:58:00 EDT, SF CP Login, COOK-MD,  DAVID THOMAS, Print label Y/N, sf_accession_2, sf_accession_2, 1.8 mL UXNA/*[id number]*/,...               Radiology Orders  Name Status Details   XR Chest 1 View Portable Completed 07/24/19 10:51:00 EDT, STAT 1 hour or less, Reason: Other abnormalities of breathing, Transport Mode: STRETCHER, pp_set_radiology_subspecialty               Patient Care Orders  Name Status Details   Discharge Patient Ordered 07/24/19 12:41:00 EDT   ED Assessment Adult Completed 07/24/19 10:37:32 EDT, 07/24/19 10:37:32 EDT   ED Secondary Triage Completed 07/24/19 10:37:32 EDT, 07/24/19 10:37:32 EDT   ED Triage Adult Completed 07/24/19 10:27:32 EDT, 07/24/19 10:27:32 EDT   May Access Port Completed 07/24/19 10:51:00 EDT, May access implanted port, 07/24/19 10:51:00 EDT, 07/24/19 10:51:00 EDT, Once       ---------------------------------------------------------------------------------------------------------------------  [facility]) encourages you to self-enroll in the [facility] Patient Portal.  [facility] Patient Portal will allow you to manage your personal health information securely from your own electronic device now and in the future.  To begin your Patient Portal enrollment process, please visit [email or web address] Click on "Sign up now" under [facility].  If you find that you need additional assistance on the [facility] Patient Portal or need a copy of your medical records, please call the [facility] Medical Records  Office at [phone].  Comment:

## 2019-07-24 NOTE — ED Notes (Signed)
ED Triage Note       ED Triage Adult Entered On:  07/24/2019 10:37 EDT    Performed On:  07/24/2019 10:35 EDT by Tresa EndoKelly, RN, Rex KrasEdward J               Triage   Chief Complaint :   Pt c/o n/v/d x3 days. Pt states she thinks she in in a sickle cell crisis   Numeric Rating Pain Scale :   8   [place] Mode of Arrival :   Private vehicle   Infectious Disease Documentation :   Document assessment   Temperature Oral :   36.6 degC(Converted to: 97.9 degF)    Heart Rate Monitored :   88 bpm   Respiratory Rate :   16 br/min   Systolic Blood Pressure :   148 mmHg (HI)    Diastolic Blood Pressure :   81 mmHg   SpO2 :   100 %   Patient presentation :   None of the above   Chief Complaint or Presentation suggest infection :   No   Weight Dosing :   57 kg(Converted to: 125 lb 11 oz)    Height :   160 cm(Converted to: 5 ft 3 in)    Body Mass Index Dosing :   22 kg/m2   Dwaine GaleKelly, RN, Edward J - 07/24/2019 10:35 EDT   DCP GENERIC CODE   Tracking Acuity :   3   Tracking Group :   [id number]   Tresa EndoKelly, RRex Kras, Edward J - 07/24/2019 10:35 EDT   ED General Section :   Document assessment   Pregnancy Status :   Patient denies   ED Allergies Section :   Document assessment   ED Reason for Visit Section :   Document assessment   ED Quick Assessment :   Patient appears awake, alert, oriented to baseline. Skin warm and dry. Moves all extremities. Respiration even and unlabored. Appears in no apparent distress.   Tresa EndoKelly, RN, Rex Krasdward J - 07/24/2019 10:35 EDT   ID Risk Screen Symptoms   Recent Travel History :   No recent travel   Close Contact with COVID-19 ID :   No   Last 14 days COVID-19 ID :   No   TB Symptom Screen :   No symptoms   C. diff Symptom/History ID :   Neither of the above   Dwaine GaleKelly, RN, Edward J - 07/24/2019 10:35 EDT   Allergies   (As Of: 07/24/2019 10:37:32 EDT)   Allergies (Active)   Bactrim  Estimated Onset Date:   Unspecified ; Reactions:   Itching ; Created By:   Malena PeerNOLAN, RN, LAUREN B; Reaction Status:   Active ; Category:   Drug ;  Substance:   Bactrim ; Type:   Allergy ; Severity:   Moderate ; Updated By:   Malena PeerNOLAN, RN, LAUREN B; Reviewed Date:   07/24/2019 10:36 EDT      contrast media (iodine-based)  Estimated Onset Date:   Unspecified ; Created ByRock Nephew:   ALBAYALDE, RN, JENNETTE; Reaction Status:   Active ; Category:   Drug ; Substance:   contrast media (iodine-based) ; Type:   Allergy ; Severity:   Unknown ; Updated By:   Irene LimboALBAYALDE, RN, JENNETTE; Reviewed Date:   07/24/2019 10:36 EDT      fentaNYL  Estimated Onset Date:   Unspecified ; Created ByRock Nephew:   ALBAYALDE, RN, JENNETTE; Reaction Status:   Active ; Category:  Drug ; Substance:   fentaNYL ; Type:   Allergy ; Severity:   Unknown ; Updated By:   Recardo Evangelist; Reviewed Date:   07/24/2019 10:36 EDT      morphine  Estimated Onset Date:   Unspecified ; Created ByOkey Dupre, RN, JENNETTE; Reaction Status:   Active ; Category:   Drug ; Substance:   morphine ; Type:   Allergy ; Severity:   Unknown ; Updated By:   Recardo Evangelist; Reviewed Date:   07/24/2019 10:36 EDT      Rocephin  Estimated Onset Date:   Unspecified ; Created By:   Okey Dupre RN, JENNETTE; Reaction Status:   Active ; Category:   Drug ; Substance:   Rocephin ; Type:   Allergy ; Severity:   Unknown ; Updated By:   Recardo Evangelist; Reviewed Date:   07/24/2019 10:36 EDT        Psycho-Social   Last 3 mo, thoughts killing self/others :   Patient denies   Leonia Corona - 07/24/2019 10:35 EDT   ED Reason for Visit   (As Of: 07/24/2019 10:37:32 EDT)   Problems(Active)    Alteration in comfort: pain (SNOMED CT  :09811914 )  Name of Problem:   Alteration in comfort: pain ; Recorder:   SYSTEM,  SYSTEM; Confirmation:   Confirmed ; Classification:   Interdisciplinary ; Code:   [id number] ; Last Updated:   03/28/2019 2:23 EDT ; Life Cycle Date:   03/28/2019 ; Life Cycle Status:   Active ; Vocabulary:   SNOMED CT   ; Comments:        03/28/2019 2:23 - SYSTEM,  SYSTEM  Problem added automatically by system based on initiation  of Alteration in Comfort Plan of Care      Hypertension (IMO  :30865 )  Name of Problem:   Hypertension ; Recorder:   Okey Dupre, RN, JENNETTE; Confirmation:   Confirmed ; Classification:   Medical ; Code:   [id number] ; Contributor System:   Conservation officer, nature ; Last Updated:   11/08/2015 5:36 EST ; Life Cycle Date:   11/08/2015 ; Life Cycle Status:   Active ; Vocabulary:   IMO        Shortness of breath (IMO  :62952 )  Name of Problem:   Shortness of breath ; Recorder:   SYSTEM,  SYSTEM; Confirmation:   Confirmed ; Classification:   Patient Stated ; Code:   84132 ; Last Updated:   04/17/2019 15:58 EDT ; Life Cycle Date:   04/17/2019 ; Life Cycle Status:   Active ; Vocabulary:   IMO        Sickle cell (SNOMED CT  :44010272 )  Name of Problem:   Sickle cell ; Recorder:   ALBAYALDE, RN, JENNETTE; Confirmation:   Confirmed ; Classification:   Medical ; Code:   [id number] ; Contributor System:   Conservation officer, nature ; Last Updated:   11/08/2015 5:36 EST ; Life Cycle Date:   11/08/2015 ; Life Cycle Status:   Active ; Vocabulary:   SNOMED CT          Diagnoses(Active)    Sickle cell pain  Date:   07/24/2019 ; Diagnosis Type:   Reason For Visit ; Confirmation:   Complaint of ; Clinical Dx:   Sickle cell pain ; Classification:   Medical ; Clinical Service:   Non-Specified ; Code:   PNED ; Probability:   0 ; Diagnosis Code:   [id number]

## 2019-07-24 NOTE — ED Notes (Signed)
ED Patient Education Note     Patient Education Materials Follows:  Home Health Care     Sickle Cell Anemia, Adult    Sickle cell anemia is a condition in which red blood cells have an abnormal "sickle" shape. This abnormal shape shortens the cells' life span, which results in a lower than normal concentration of red blood cells in the blood. The sickle shape also causes the cells to clump together and block free blood flow through the blood vessels. As a result, the tissues and organs of the body do not receive enough oxygen. Sickle cell anemia causes organ damage and pain and increases the risk of infection.      CAUSES    Sickle cell anemia is a genetic disorder. Those who receive two copies of the gene have the condition, and those who receive one copy have the trait.    RISK FACTORS    The sickle cell gene is most common in people whose families originated in [place]. Other areas of the globe where sickle cell trait occurs include the [place], [place], the [place], and the [place].     SIGNS AND SYMPTOMS     Pain, especially in the extremities, back, chest, or abdomen (common). The pain may start suddenly or may develop following an illness, especially if there is dehydration. Pain can also occur due to overexertion or exposure to extreme temperature changes.     Frequent severe bacterial infections, especially certain types of pneumonia and meningitis.     Pain and swelling in the hands and feet.     Decreased activity. ?     Loss of appetite. ?     Change in behavior.      Headaches.     Seizures.     Shortness of breath or difficulty breathing.     Vision changes.     Skin ulcers.    Those with the trait may not have symptoms or they may have mild symptoms.     DIAGNOSIS    Sickle cell anemia is diagnosed with blood tests that demonstrate the genetic trait. It is often diagnosed during the newborn period, due to mandatory testing nationwide. A variety of blood tests, X-rays, CT  scans, MRI scans, ultrasounds, and lung function tests may also be done to monitor the condition.    TREATMENT    Sickle cell anemia may be treated with:     Medicines. You may be given pain medicines, antibiotic medicines (to treat and prevent infections) or medicines to increase the production of certain types of hemoglobin.     Fluids.     Oxygen.     Blood transfusions.    HOME CARE INSTRUCTIONS     Drink enough fluid to keep your urine clear or pale yellow. Increase your fluid intake in hot weather and during exercise.      Do not smoke. Smoking lowers oxygen levels in the blood. ?     Only take over-the-counter or prescription medicines for pain, fever, or discomfort as directed by your health care provider.     Take antibiotics as directed by your health care provider. Make sure you finish them it even if you start to feel better. ?     Take supplements as directed by your health care provider. ?     Consider wearing a medical alert bracelet. This tells anyone caring for you in an emergency of your condition. ?     When traveling,  keep your medical information, health care provider's names, and the medicines you take with you at all times. ?     If you develop a fever, do not take medicines to reduce the fever right away. This could cover up a problem that is developing. Notify your health care provider.      Keep all follow-up appointments with your health care provider. Sickle cell anemia requires regular medical care.     SEEK MEDICAL CARE IF:    ?You have a fever.    SEEK IMMEDIATE MEDICAL CARE IF:     You feel dizzy or faint. ?     You have new abdominal pain, especially on the left side near the stomach area. ?     You develop a persistent, often uncomfortable and painful penile erection (priapism). If this is not treated immediately it will lead to impotence. ?     You have numbness your arms or legs or you have a hard time moving them. ?     You have a hard time with speech. ?     You have a fever or  persistent symptoms for more than 2?3 days. ?     You have a fever and your symptoms suddenly get worse. ?     You have signs or symptoms of infection. These include: ?    ? Chills. ?    ? Abnormal tiredness (lethargy). ?    ? Irritability. ?    ? Poor eating. ?    ? Vomiting. ?     You develop pain that is not helped with medicine. ?     You develop shortness of breath.     You have pain in your chest. ?     You are coughing up pus-like or bloody sputum. ?     You develop a stiff neck.     Your feet or hands swell or have pain.     Your abdomen appears bloated.     You develop joint pain.    MAKE SURE YOU:     Understand these instructions.    This information is not intended to replace advice given to you by your health care provider. Make sure you discuss any questions you have with your health care provider.    Document Released: 01/31/2006 Document Revised: 11/13/2014 Document Reviewed: 06/04/2013  Elsevier Interactive Patient Education ?2016 [employer]

## 2019-07-24 NOTE — ED Provider Notes (Signed)
General ED Note        Patient:   Judith [name], Judith [name]            MRN: [id number]            FIN: [id number]               Age:   33 years     Sex:  Female     DOB:  [birth year]   Associated Diagnoses:   Sickle cell pain crisis   Author:   Teneil Shiller-MD,  [place]   Time seen: Provider Seen (ST)   ED Provider/Time:    Alayzha An-MD,  Evee Liska Marcello Moores / 07/24/2019 10:44  .   Patient information:: Chief Complaint from Nursing Triage Note : Chief Complaint   07/24/2019 10:35 EDT      Chief Complaint           Pt c/o n/v/d x3 days. Pt states she thinks she in in a sickle cell crisis  .   History source: Patient.      History of Present Illness   The patient presents to the emergency department with reported left arm pain and upper back pain as well as nausea, vomiting, and diarrhea over the past 2 to 3 days.  The patient equates these symptoms to her typical sickle cell pain crisis symptoms, noting that she typically has pain in the upper back and upper extremities more in the lower extremities.  She also notes that her left arm was swollen yesterday, but the swelling has resolved today.  She denies fever, dyspnea, palpitations, or any other associated symptoms.  She does note that weather change is often a trigger of pain crises for her, and it has been cold and rainy for the past couple days.  She is followed by hematology at [facility] (Dr. Hassell Done), and notes that she has an appointment there tomorrow.  Her typical outpatient regimen is 30 mg oxycodone 3-4 times a day as well as end-stage, as needed, for pain relief.  She notes that she has been taking that medication without significant relief.      Review of Systems   Constitutional symptoms:  No fever, no chills.    Skin symptoms:  No rash,    Respiratory symptoms:  No shortness of breath, no cough.    Cardiovascular symptoms:  No chest pain, no palpitations.    Gastrointestinal symptoms:  Nausea, vomiting, diarrhea, No abdominal pain,    Musculoskeletal symptoms:  Back  pain, left arm pain.    Neurologic symptoms:  No headache, no dizziness, no numbness, no tingling, no focal weakness.              Additional review of systems information: All other systems reviewed and otherwise negative.      Health Status   Allergies:    Allergic Reactions (Selected)  Moderate  Bactrim- Itching.  Unknown  Contrast media (iodine-based)- No reactions were documented.  FentaNYL- No reactions were documented.  Morphine- No reactions were documented.  Rocephin- No reactions were documented..   Medications:  (Selected)   Inpatient Medications  Ordered  Dilaudid: 2 mg, 1 tabs, Oral, Once  Documented Medications  Documented  Benadryl 25 mg oral tablet: 50 mg, 2 tabs, Oral, PRN: prn, 0 Refill(s)  CeleXA 40 mg oral tablet: 40 mg, 1 tabs, Oral, Daily, 30 tabs, 0 Refill(s)  Colace 100 mg oral capsule: 100 mg, 1 caps, Oral, BID, PRN: as needed for constipation,  0 Refill(s)  Dulcolax Laxative 5 mg oral delayed release tablet: 5 mg, 1 tabs, Oral, Daily, PRN, 0 Refill(s)  KlonoPIN 0.5 mg oral tablet: 0.5 mg, 1 tabs, Oral, Once a Day (at bedtime), 0 Refill(s)  Proventil HFA 90 mcg/inh inhalation aerosol: 2 puffs, Inhale, q4hr, PRN, 6.7 g, 0 Refill(s)  Remeron 15 mg oral tablet: 15 mg, 1 tabs, Oral, Once a Day (at bedtime), 30 tabs, 0 Refill(s)  cyclobenzaprine 5 mg oral tablet: 5 mg, 1 tabs, Oral, TID, for 14 days, 42 tabs, 0 Refill(s)  fentaNYL 50 mcg/hr transdermal film, extended release: 1 patches, TD, q72hr, 0 Refill(s)  folic acid 1 mg oral tablet: 1 mg, 1 tabs, Oral, Daily, 0 Refill(s)  ibuprofen 800 mg oral tablet: 800 mg, 1 tabs, Oral, TID, PRN: for pain, 30 tabs, 0 Refill(s)  oxyCODONE 30 mg oral tablet: 30 mg, 1 tabs, Oral, q4hr, PRN: for pain, 0 Refill(s)  promethazine 25 mg oral tablet: 25 mg, 1 tabs, Oral, q6hr, PRN: as needed for nausea/vomiting, 0 Refill(s)  sodium bicarbonate 650 mg oral tablet: 325 mg, 0.5 tabs, Oral, BID, 0 Refill(s).      Past Medical/ Family/ Social History   Medical history:  Reviewed as documented in chart.   Surgical history: Reviewed as documented in chart.   Family history: Reviewed as documented in chart.   Social history: Reviewed as documented in chart.      Physical Examination               Vital Signs   Vital Signs   07/24/2019 11:28 EDT Respiratory Rate 16 br/min   1/32/4401 02:72 EDT Systolic Blood Pressure 536 mmHg  HI    Diastolic Blood Pressure 81 mmHg    Temperature Oral 36.6 degC    Heart Rate Monitored 88 bpm    Respiratory Rate 16 br/min    SpO2 100 %   .               Per nurse's notes.   Measurements   07/24/2019 10:37 EDT Body Mass Index est meas 22.27 kg/m2    Body Mass Index Measured 22.27 kg/m2   07/24/2019 10:35 EDT Height/Length Measured 160 cm    Weight Dosing 57 kg   .   Basic Oxygen Information   07/24/2019 10:35 EDT      SpO2                      100 %  .   General:  Alert, no acute distress, cooperative.    Skin:  Warm, dry, pink, no rash.    Head:  Normocephalic, atraumatic.    Neck:  Supple, no tenderness.    Ears, nose, mouth and throat:  Oral mucosa moist, airway patent.    Cardiovascular:  Regular rate and rhythm, Normal peripheral perfusion, No edema.    Respiratory:  Lungs are clear to auscultation, respirations are non-labored, breath sounds are equal.    Gastrointestinal:  Soft, Nontender.    Back:  Mild tenderness across the upper thoracic back, with no step-off.  Remainder of the back exam is unremarkable.  Full range of motion..   Musculoskeletal:  Neurovascular Intact, Mild tenderness over the proximal aspect of the left humerus.  Normal range of motion of the left shoulder.  No swelling.  Remainder of the musculoskeletal exam is unremarkable..    Neurological:  Alert and oriented to person, place, time, and situation, Neuro exam grossly normal and patient is moving all  extremities.Marland Kitchen    Psychiatric:  Cooperative, appropriate mood & affect.       Medical Decision Making   Rationale:  07/24/2019 12:39:24:   Labs are consistent with acute sickle cell  crisis, but the patient's hemoglobin is in excess of 7, which she notes her baseline to be around 7.  Pain is improved after medication in the ED.  She has not had any vomiting in the ED and is tolerating oral intake without difficulty.  The patient actually requests discharge home at this time, noting that she will continue her usual outpatient regimen.  I advised her to return to the ED, as needed, for any new or worsening symptoms and to follow-up with her hematologist tomorrow, as scheduled..   Documents reviewed:  vital signs, Triage Nurse Note.    Results review:  Lab results : Lab View   07/24/2019 12:21 EDT Estimated Creatinine Clearance 131.10 mL/min   07/24/2019 11:45 EDT WBC 14.5 x10e3/mcL  HI    RBC 2.60 x10e6/mcL  LOW    RBC 2.60 x10e6/mcL  LOW    Hgb 7.4 g/dL  LOW    HCT 21.7 %  LOW    MCV 83.5 fL    MCH 28.5 pg    MCHC 34.1 g/dL    RDW 17.1 %  HI    Platelet 582 x10e3/mcL  HI    MPV 9.9 fL    NRBC Absolute Auto 0.000 x10e3/mcL    NRBC Percent Auto 0.0 %    Retic Percent 5.3 %  HI    Absolute Retic 0.1410 /mcL  HI    Sodium Lvl 140 mmol/L    Potassium Lvl 3.6 mmol/L    Chloride 112 mmol/L  HI    CO2 16 mmol/L  LOW    Glucose Random 96 mg/dL    BUN 8 mg/dL    Creatinine Lvl 0.5 mg/dL    AGAP 12 mmol/L    Osmolality Calc 278 mOsm/kg    Calcium Lvl 8.7 mg/dL    eGFR AA 146 mL/min/1.6m???    eGFR Non-AA 126 mL/min/1.777m??   07/24/2019 10:37 EDT Estimated Creatinine Clearance 131.10 mL/min   .   Radiology results:  Rad Results (ST)   XR Chest 1 View Portable  ?  07/24/19 11:23:05  Chest AP: 07/24/19    INDICATION:Other abnormalities of breathing.    COMPARISON: Chest radiograph June 09, 2019    FINDINGS:  Support Devices: Stable right chest port.    Heart/Mediastinum: Stable.    Lungs/Pleura: Reticulation at the right lung base is similar prior and may  reflect regions of scarring. No definite new focal consolidation. No pleural  effusion. No pneumothorax.    Bones/Soft Tissues: No acute  abnormality.    IMPRESSION:  Stable chest. Reticular opacities at the right lung base are similar to prior  examination and may reflect regions of scarring.  ?  Signed By: OBMilly Jakob, reviewed radiologist's report.       Reexamination/ Reevaluation   Re-examination/Re-evaluation:   Impression and Plan   Diagnosis   Sickle cell pain crisis (ICD10-CM D57.00, Discharge, Medical)   Plan   Condition: Improved.    Disposition: Discharged: Time  07/24/2019 12:40:00, to home.    Patient was given the following educational materials: Sickle Cell Anemia, Adult.    Follow up with: [facility] Only Providers - No Access Within 1 week Please follow-up, as instructed.  Return to the Emergency Department, as needed, for any new/worsening symptoms or  further concerns.     If you do not have a primary care physician or need assistance finding any other type of physician, you may call [phone] for assistance.     Continue your current home medications, as already prescribed. .    Counseled: Patient, Family, Regarding diagnosis, Regarding diagnostic results, Regarding treatment plan, I had a detailed discussion with the patient and/or guardian regarding the historical points/exam findings supporting the discharge diagnosis and need for outpatient followup. Discussed the need to return to the ER if symptoms persist/worsen, or for any questions/concerns that arise at home.    Signature Line     Electronically Signed on 07/24/2019 12:41 PM EDT   ________________________________________________   Brodrick Curran-MD,  Ellen Henri

## 2019-07-24 NOTE — ED Notes (Signed)
 ED Triage Note       ED Secondary Triage Entered On:  07/24/2019 11:28 EDT    Performed On:  07/24/2019 11:27 EDT by Mavis, RN, Claudia A               General Information   Barriers to Learning :   None evident   ED Home Meds Section :   Document assessment   [facility] ED Fall Risk Section :   Document assessment   ED Advance Directives Section :   Document assessment   ED Palliative Screen :   N/A (prefilled for <34yo)   Mavis, RN, Will A - 07/24/2019 11:27 EDT   (As Of: 07/24/2019 11:28:21 EDT)   Problems(Active)    Alteration in comfort: pain (SNOMED CT  :65759985 )  Name of Problem:   Alteration in comfort: pain ; Recorder:   SYSTEM,  SYSTEM; Confirmation:   Confirmed ; Classification:   Interdisciplinary ; Code:   [id number] ; Last Updated:   03/28/2019 2:23 EDT ; Life Cycle Date:   03/28/2019 ; Life Cycle Status:   Active ; Vocabulary:   SNOMED CT   ; Comments:        03/28/2019 2:23 - SYSTEM,  SYSTEM  Problem added automatically by system based on initiation of Alteration in Comfort Plan of Care      Hypertension (IMO  :13508 )  Name of Problem:   Hypertension ; Recorder:   CHARLIES, RN, JENNETTE; Confirmation:   Confirmed ; Classification:   Medical ; Code:   [phone] ; Contributor System:   PowerChart ; Last Updated:   11/08/2015 5:36 EST ; Life Cycle Date:   11/08/2015 ; Life Cycle Status:   Active ; Vocabulary:   IMO        Shortness of breath (IMO  :72576 )  Name of Problem:   Shortness of breath ; Recorder:   SYSTEM,  SYSTEM; Confirmation:   Confirmed ; Classification:   Patient Stated ; Code:   72576 ; Last Updated:   04/17/2019 15:58 EDT ; Life Cycle Date:   04/17/2019 ; Life Cycle Status:   Active ; Vocabulary:   IMO        Sickle cell (SNOMED CT  :16827988 )  Name of Problem:   Sickle cell ; Recorder:   ALBAYALDE, RN, JENNETTE; Confirmation:   Confirmed ; Classification:   Medical ; Code:   [id number] ; Contributor System:   PowerChart ; Last Updated:   11/08/2015 5:36 EST ; Life Cycle Date:   11/08/2015 ; Life  Cycle Status:   Active ; Vocabulary:   SNOMED CT          Diagnoses(Active)    Sickle cell pain  Date:   07/24/2019 ; Diagnosis Type:   Reason For Visit ; Confirmation:   Complaint of ; Clinical Dx:   Sickle cell pain ; Classification:   Medical ; Clinical Service:   Non-Specified ; Code:   PNED ; Probability:   0 ; Diagnosis Code:   [id number]             -    Procedure History   (As Of: 07/24/2019 11:28:21 EDT)     Anesthesia Minutes:   0 ; Procedure Name:   Port ; Procedure Minutes:   0            Anesthesia Minutes:   0 ; Procedure Name:   thoracotomy ; Procedure Minutes:   0  Anesthesia Minutes:   0 ; Procedure Name:   hernia repair ; Procedure Minutes:   0            Anesthesia Minutes:   0 ; Procedure Name:   splenectomy ; Procedure Minutes:   0            Anesthesia Minutes:   0 ; Procedure Name:   Cholecystectomy; ; Procedure Minutes:   0            [facility] Fall Risk Assessment Tool   Hx of falling last 3 months ED Fall :   No   Patient confused or disoriented ED Fall :   No   Patient intoxicated or sedated ED Fall :   No   Patient impaired gait ED Fall :   No   Use a mobility assistance device ED Fall :   No   Patient altered elimination ED Fall :   No   [facility] ED Fall Score :   0    Mavis, RN, Will A - 07/24/2019 11:27 EDT   ED Advance Directive   Advance Directive :   No   Mavis, RN, Claudia A - 07/24/2019 11:27 EDT   Med Hx   Medication List   (As Of: 07/24/2019 11:28:22 EDT)   Normal Order    diphenhydrAMINE 50 mg/mL Inj Soln 1 mL  :   diphenhydrAMINE 50 mg/mL Inj Soln 1 mL ; Status:   Ordered ; Ordered As Mnemonic:   Benadryl ; Simple Display Line:   12.5 mg, 0.25 mL, IV Push, Once ; Ordering Provider:   COOK-MD,  ALM NED; Catalog Code:   diphenhydrAMINE ; Order Dt/Tm:   07/24/2019 10:51:10 EDT          HYDROmorphone 2 mg/mL Inj Soln 1 mL  :   HYDROmorphone 2 mg/mL Inj Soln 1 mL ; Status:   Ordered ; Ordered As Mnemonic:   Dilaudid ; Simple Display Line:    1 mg, 0.5 mL, IV Push, Once ; Ordering Provider:   COOK-MD,  ALM NED; Catalog Code:   HYDROmorphone ; Order Dt/Tm:   07/24/2019 10:51:10 EDT          ketorolac 15 mg/mL Inj Soln 1 mL  :   ketorolac 15 mg/mL Inj Soln 1 mL ; Status:   Ordered ; Ordered As Mnemonic:   Toradol ; Simple Display Line:   15 mg, 1 mL, IV Push, Once ; Ordering Provider:   COOK-MD,  ALM NED; Catalog Code:   ketorolac ; Order Dt/Tm:   07/24/2019 10:51:10 EDT          promethazine 25 mg/mL Inj Soln 1 mL  :   promethazine 25 mg/mL Inj Soln 1 mL ; Status:   Ordered ; Ordered As Mnemonic:   promethazine IV - High Alert ; Simple Display Line:   6.25 mg, 0.25 mL, IV Push, Once ; Ordering Provider:   COOK-MD,  ALM NED; Catalog Code:   promethazine ; Order Dt/Tm:   07/24/2019 10:51:10 EDT          Sodium Chloride 0.9% intravenous solution Bolus  :   Sodium Chloride 0.9% intravenous solution Bolus ; Status:   Ordered ; Ordered As Mnemonic:   Sodium Chloride 0.9% bolus ; Simple Display Line:   1,000 mL, 2000 mL/hr, IV Piggyback, Once ; Ordering Provider:   COOK-MD,  ALM NED; Catalog Code:   Sodium Chloride 0.9% ; Order Dt/Tm:   07/24/2019 10:51:09 EDT  Home Meds    sodium bicarbonate  :   sodium bicarbonate ; Status:   Documented ; Ordered As Mnemonic:   sodium bicarbonate 650 mg oral tablet ; Simple Display Line:   325 mg, 0.5 tabs, Oral, BID, 0 Refill(s) ; Ordering Provider:   TAMALA HINDERS; Catalog Code:   sodium bicarbonate ; Order Dt/Tm:   04/18/2019 14:27:46 EDT          cyclobenzaprine  :   cyclobenzaprine ; Status:   Documented ; Ordered As Mnemonic:   cyclobenzaprine 5 mg oral tablet ; Simple Display Line:   5 mg, 1 tabs, Oral, TID, for 14 days, 42 tabs, 0 Refill(s) ; Catalog Code:   cyclobenzaprine ; Order Dt/Tm:   04/17/2019 19:33:49 EDT ; Comment:    THIS MEDICATION IS ASSOCIATED   WITH   AN INCREASED RISK OF FALLS.          albuterol  :   albuterol ; Status:   Documented ; Ordered As Mnemonic:   Proventil HFA  90 mcg/inh inhalation aerosol ; Simple Display Line:   2 puffs, Inhale, q4hr, PRN, 6.7 g, 0 Refill(s) ; Catalog Code:   albuterol ; Order Dt/Tm:   03/28/2019 09:31:25 EDT          bisacodyl  :   bisacodyl ; Status:   Documented ; Ordered As Mnemonic:   Dulcolax Laxative 5 mg oral delayed release tablet ; Simple Display Line:   5 mg, 1 tabs, Oral, Daily, PRN, 0 Refill(s) ; Catalog Code:   bisacodyl ; Order Dt/Tm:   03/28/2019 09:31:31 EDT ; Comment:   DO NOT CRUSH          citalopram  :   citalopram ; Status:   Documented ; Ordered As Mnemonic:   CeleXA 40 mg oral tablet ; Simple Display Line:   40 mg, 1 tabs, Oral, Daily, 30 tabs, 0 Refill(s) ; Catalog Code:   citalopram ; Order Dt/Tm:   03/28/2019 09:26:41 EDT ; Comment:   SOUND ALIKE / LOOK ALIKE - VERIFY DRUG          clonazePAM  :   clonazePAM ; Status:   Documented ; Ordered As Mnemonic:   KlonoPIN 0.5 mg oral tablet ; Simple Display Line:   0.5 mg, 1 tabs, Oral, Once a Day (at bedtime), 0 Refill(s) ; Catalog Code:   clonazePAM ; Order Dt/Tm:   03/28/2019 09:26:46 EDT ; Comment:   SOUND ALIKE / LOOK ALIKE - VERIFY DRUG  SOUND ALIKE / LOOK ALIKE - VERIFY DRUG          diphenhydrAMINE  :   diphenhydrAMINE ; Status:   Documented ; Ordered As Mnemonic:   Benadryl 25 mg oral tablet ; Simple Display Line:   50 mg, 2 tabs, Oral, PRN: prn, 0 Refill(s) ; Catalog Code:   diphenhydrAMINE ; Order Dt/Tm:   03/28/2019 09:31:07 EDT          docusate  :   docusate ; Status:   Documented ; Ordered As Mnemonic:   Colace 100 mg oral capsule ; Simple Display Line:   100 mg, 1 caps, Oral, BID, PRN: as needed for constipation, 0 Refill(s) ; Catalog Code:   docusate ; Order Dt/Tm:   03/28/2019 09:31:40 EDT          fentaNYL  :   fentaNYL ; Status:   Documented ; Ordered As Mnemonic:   fentaNYL 50 mcg/hr transdermal film, extended release ; Simple Display Line:  1 patches, TD, q72hr, 0 Refill(s) ; Catalog Code:   fentaNYL ; Order Dt/Tm:   03/28/2019 09:27:02 EDT          folic acid  :    folic acid ; Status:   Documented ; Ordered As Mnemonic:   folic acid 1 mg oral tablet ; Simple Display Line:   1 mg, 1 tabs, Oral, Daily, 0 Refill(s) ; Catalog Code:   folic acid ; Order Dt/Tm:   03/28/2019 90:71:74 EDT          ibuprofen  :   ibuprofen ; Status:   Documented ; Ordered As Mnemonic:   ibuprofen 800 mg oral tablet ; Simple Display Line:   800 mg, 1 tabs, Oral, TID, PRN: for pain, 30 tabs, 0 Refill(s) ; Catalog Code:   ibuprofen ; Order Dt/Tm:   03/28/2019 09:28:46 EDT          mirtazapine  :   mirtazapine ; Status:   Documented ; Ordered As Mnemonic:   Remeron 15 mg oral tablet ; Simple Display Line:   15 mg, 1 tabs, Oral, Once a Day (at bedtime), 30 tabs, 0 Refill(s) ; Catalog Code:   mirtazapine ; Order Dt/Tm:   03/28/2019 90:71:47 EDT          oxyCODONE  :   oxyCODONE ; Status:   Documented ; Ordered As Mnemonic:   oxyCODONE 30 mg oral tablet ; Simple Display Line:   30 mg, 1 tabs, Oral, q4hr, PRN: for pain, 0 Refill(s) ; Catalog Code:   oxyCODONE ; Order Dt/Tm:   03/28/2019 09:29:01 EDT          promethazine  :   promethazine ; Status:   Documented ; Ordered As Mnemonic:   promethazine 25 mg oral tablet ; Simple Display Line:   25 mg, 1 tabs, Oral, q6hr, PRN: as needed for nausea/vomiting, 0 Refill(s) ; Catalog Code:   promethazine ; Order Dt/Tm:   03/28/2019 09:32:47 EDT

## 2019-09-25 NOTE — ED Notes (Signed)
 ED Patient Summary              [facility] Emergency Department  [address] [place] [address]  [phone]  Discharge Instructions (Patient)  _______________________________________     Name: Judith [name], Judith [name]  DOB: 04-14-1986                   MRN: [id number]                   FIN: [id number]  Reason For Visit: Shortness of breath; SICKLE CELL PAIN/SOB  Final Diagnosis: Bronchospasm; Sickle cell crisis; Upper respiratory infection     Visit Date: 09/25/2019 18:28:00  Address: [address] [place] [address]  Phone: [phone]     Emergency Department Providers:         Primary Physician:   VASSIE RICARDO Shelvy Gwenn Lionel would like to thank you for allowing us  to assist you with your healthcare needs. The following includes patient education materials and information regarding your injury/illness.     Follow-up Instructions:  You were seen today on an emergency basis. Please contact your primary care doctor for a follow up appointment. If you received a referral to a specialist doctor, it is important you follow-up as instructed.    It is important that you call your follow-up doctor to schedule and confirm the location of your next appointment. Your doctor may practice at multiple locations. The office location of your follow-up appointment may be different to the one written on your discharge instructions.    If you do not have a primary care doctor, please call [phone] for help in finding a [name]. [facility] Provider. For help in finding a specialist doctor, please call [phone].    The [facility] "Ask a Nurse" line in staffed by Registered Nurses and is a free service to the community. We are available Monday - Friday from 8am to 5pm to answer your questions about your health. Please call [phone].    If your condition gets worse before your follow-up with your primary care doctor or specialist, please  return to the Emergency Department.        Follow Up Appointments:  Primary Care Provider:      Name: PCP,  NONE      Phone:                  With: Address: When:   [name], View Only Providers - No Access [address] [place], [place] [address]  [phone] Business (1)    Comments:   no later than Monday 09/29/19 for further evaluation & management of your symptoms. This is important!     Take Azithromycin (antibiotic) asnd Prednisone (steroid) as prescribed, on a scheduled basis, until you have completed the entire prescriptions.     Use the albuterol inhaler (provided) as follows: 2 puffs every four hours, as needed, for wheezing and/or coughing and/or shortness of breath.     Otherwise, continue any other previously prescribed medicines as directed in the past.     Return to the Emergency Department anytime for intolerable pain, worsening difficulty breathing, fever over 100.4 F, or ANY other concerns.              Printed Prescriptions:    Patient Education Materials:  Discharge Orders          Discharge  Patient 09/25/19 23:02:00 EST         Comment:             Allergy Info: fentaNYL; contrast media (iodine-based); Rocephin; Bactrim; morphine     Medication Information:  [facility] ED Physicians provided you with a complete list of medications post discharge, if you have been instructed to stop taking a medication please ensure you also follow up with this information to your Primary Care Physician.  Unless otherwise noted, patient will continue to take medications as prescribed prior to the Emergency Room visit.  Any specific questions regarding your chronic medications and dosages should be discussed with your physician(s) and pharmacist.          albuterol (Proventil HFA 90 mcg/inh inhalation aerosol) 2 Puffs Inhale (breathe in) every 4 hours as needed.  azithromycin (Zithromax Z-Pak 250 mg oral tablet) as directed on package labeling. Refills: 0.  bisacodyl (Dulcolax Laxative 5 mg  oral delayed release tablet) 1 Tabs Oral (given by mouth) every day as needed., DO NOT CRUSH  citalopram (CeleXA 40 mg oral tablet) 1 Tabs Oral (given by mouth) every day., SOUND ALIKE / LOOK ALIKE - VERIFY DRUG  clonazePAM (KlonoPIN 0.5 mg oral tablet) 1 Tabs Oral (given by mouth) Once a Day (at bedtime)., SOUND ALIKE / LOOK ALIKE - VERIFY DRUG   SOUND ALIKE / LOOK ALIKE - VERIFY DRUG  cyclobenzaprine (cyclobenzaprine 5 mg oral tablet) 1 Tabs Oral (given by mouth) 3 times a day for 14 Days.,  THIS MEDICATION IS ASSOCIATED  WITH  AN INCREASED RISK OF FALLS.  diphenhydrAMINE (Benadryl 25 mg oral tablet) 2 Tabs Oral (given by mouth) as needed prn.  docusate (Colace 100 mg oral capsule) 1 Capsules Oral (given by mouth) 2 times a day as needed as needed for constipation.  fentaNYL (fentaNYL 50 mcg/hr transdermal film, extended release) 1 Patches Transdermal (apply on the skin) every 72 hours.  folic acid (folic acid 1 mg oral tablet) 1 Tabs Oral (given by mouth) every day.  ibuprofen (ibuprofen 800 mg oral tablet) 1 Tabs Oral (given by mouth) 3 times a day as needed for pain.  mirtazapine (Remeron 15 mg oral tablet) 1 Tabs Oral (given by mouth) Once a Day (at bedtime).  oxyCODONE (oxyCODONE 30 mg oral tablet) 1 Tabs Oral (given by mouth) every 4 hours as needed for pain.  predniSONE (predniSONE 20 mg oral tablet) 2 Tabs Oral (given by mouth) every day for 4 Days. Refills: 0.  promethazine (promethazine 25 mg oral tablet) 1 Tabs Oral (given by mouth) every 6 hours as needed as needed for nausea/vomiting.  sodium bicarbonate (sodium bicarbonate 650 mg oral tablet) 0.5 Tabs Oral (given by mouth) 2 times a day.      Medications Administered During Visit:              Medication Dose Route   Sodium Chloride 0.9% 1000 mL IV Piggyback   hydromorphone 1 mg IV Push   ondansetron 4 mg IV Push   albuterol 720 mcg Inhale   methylPREDNISolone 125 mg IV Push   diphenhydrAMINE 37.5 mg IV Push   hydromorphone 1 mg IV Push           Major Tests and Procedures:  The following procedures and tests were performed during your ED visit.  COMMON PROCEDURES%>  COMMON PROCEDURES COMMENTS%>          Laboratory Orders  Name Status Details   .Preg U POC Completed Urine, RT, RT -  Routine, Collected, 09/25/19 21:43:00 EST, Nurse collect, 09/25/19 21:43:00 US Robinette, RAL POC Login   .SARS COV2 Ag FIA Completed Nasal Swab, Stat, ST - Stat, 09/25/19 20:09:00 EST, 09/25/19 20:10:00 EST, Nurse collect, PARASCHOS-MD,  THEODORE, Print label Y/N   Add On Completed Blood, Stat, ST - Stat, Collected, 09/25/19 21:50:00 EST, 09/25/19 21:50:00 EST, PARASCHOS-MD,  THEODORE, Print label Y/N, BNP, Draw Stat   C Rapid Strep Ordered Throat, Stat, ST - Stat, 09/25/19 20:10:00 EST, 09/25/19 20:10:00 EST, Nurse collect, 09/25/19 20:40:00 EST, PARASCHOS-MD,  THEODORE, Print label Y/N, Media label Y/N, sf_micro_1, [id number]   CBC Completed Blood, Stat, ST - Stat, 09/25/19 20:09:00 EST, 09/25/19 20:10:00 EST, Nurse collect, PARASCHOS-MD,  THEODORE, Print label Y/N   CMP Completed Blood, Stat, ST - Stat, 09/25/19 20:09:00 EST, 09/25/19 20:10:00 EST, Nurse collect, PARASCHOS-MD,  THEODORE, Print label Y/N   Lactic Completed Blood, ST, ST - Stat, 09/25/19 18:51:25 EST, 09/25/19 18:51:00 EST, Nurse collect, SYSTEM,  SYSTEM, Print label Y/N   NTproBNP Completed Blood, Stat, ST - Stat, Collected, 09/25/19 20:29:00 EST ROBITO, 09/25/19 20:29:00 EST, Nurse collect, Venous Draw, 09/25/19 21:51:00 EST, SF CP Login, PARASCHOS-MD,  THEODORE, Print label Y/N, sf_accession_2, 4 mL SST PST/*B*/   Rap Strep Completed Throat, Stat, ST - Stat, 09/25/19 20:10:00 EST, 09/25/19 20:10:00 EST, Nurse collect, PARASCHOS-MD,  THEODORE, Print label Y/N   Retic Auto Completed Blood, Stat, ST - Stat, 09/25/19 20:09:00 EST, 09/25/19 20:10:00 EST, Nurse collect, PARASCHOS-MD,  THEODORE, Print label Y/N   Trop T Completed Blood, Stat, ST - Stat, 09/25/19 20:09:00 EST, 09/25/19 20:10:00 EST, Nurse  collect, PARASCHOS-MD,  THEODORE, Print label Y/N   UA w Mic Completed Urine, Clean Catch, Stat, ST - Stat, 09/25/19 20:09:00 EST, 09/25/19 20:10:00 EST, Nurse collect   XTube Blue Completed Blood, Stat, ST - Stat, Collected, 09/25/19 20:29:00 EST ROBITO, 09/25/19 20:29:00 EST, Nurse collect, Venous Draw, 09/25/19 20:32:00 EST, SF CP Login, PARASCHOS-MD,  THEODORE, Print label Y/N, sf_accession_2, 1.8 mL Aolz/*[id number]*/, Complete               Radiology Orders  Name Status Details   XR Chest 2 Views Ordered 09/25/19 22:03:00 EST, STAT 1 hour or less, Reason: Other abnormalities of breathing, Transport Mode: STRETCHER, pp_set_radiology_subspecialty               Patient Care Orders  Name Status Details   COVID-19 Status Ordered 09/25/19 20:10:20 EST, NOT VALID FOR pharmacy, laboratory, radiology., 09/25/19 20:10:20 EST, COVID-19 Not Detected   Cardiac/NIBP/Pulse Ox Monitoring Completed 09/25/19 20:09:00 EST, This message can only be seen by Nursing, it is not visible to Pharmacy, Laboratory, or Radiology., 09/25/19 20:09:00 EST, 09/25/19 20:09:00 EST, Once   Communication to Nursing Completed 09/25/19 22:55:00 EST, Albuterol MDI to go with patient, 09/25/19 22:55:00 EST, 09/25/19 22:55:00 EST   DC ISO Order/Icons Ordered 09/25/19 21:10:48 EST, 09/25/19 21:10:48 EST   Discharge Patient Ordered 09/25/19 23:02:00 EST   Discontinue IV Ordered 09/25/19 23:02:48 EST, 09/25/19 23:02:48 EST   ED Assessment Adult Completed 09/25/19 18:51:25 EST, 09/25/19 18:51:25 EST   ED Secondary Triage Completed 09/25/19 18:51:25 EST, 09/25/19 18:51:25 EST   ED Triage Adult Completed 09/25/19 18:28:23 EST, 09/25/19 18:28:23 EST   May Access Port Completed 09/25/19 20:09:00 EST, This message can only be seen by Nursing, it is not visible to Pharmacy, Laboratory, or Radiology., 09/25/19 20:09:00 EST, 09/25/19 20:09:00 EST, Once   Notify Provider Completed 09/25/19 20:10:20 EST, This message can only be seen by Nursing, it is not visible to  Pharmacy, Laboratory, or Radiology., 09/25/19 20:10:20 EST   POC-Urine Pregnancy Test collect Completed 09/25/19 21:31:00 EST, Once, 09/25/19 21:31:00 EST   Patient Isolation Ordered 09/25/19 20:09:00 EST, Contact and Airborne, Constant Indicator   Saline Lock Insert Ordered 09/25/19 20:09:00 EST, Once, 09/25/19 20:09:00 EST       ---------------------------------------------------------------------------------------------------------------------  [facility]) encourages you to self-enroll in the [facility] Patient Portal.  [facility] Patient Portal will allow you to manage your personal health information securely from your own electronic device now and in the future.  To begin your Patient Portal enrollment process, please visit [email or web address] Click on "Sign up now" under [facility].  If you find that you need additional assistance on the [facility] Patient Portal or need a copy of your medical records, please call the [facility] Medical Records Office at [phone].  Comment:

## 2019-09-25 NOTE — ED Notes (Signed)
 ED Triage Note       ED Secondary Triage Entered On:  09/25/2019 19:17 EST    Performed On:  09/25/2019 19:17 EST by Micheline, RN, Delon LABOR               General Information   Barriers to Learning :   None evident   ED Home Meds Section :   Document assessment   [facility] ED Fall Risk Section :   Document assessment   ED Advance Directives Section :   Document assessment   ED Palliative Screen :   N/A (prefilled for <34yo)   Micheline, RN, Delon A - 09/25/2019 19:17 EST   (As Of: 09/25/2019 19:17:45 EST)   Problems(Active)    Alteration in comfort: pain (SNOMED CT  :65759985 )  Name of Problem:   Alteration in comfort: pain ; Recorder:   SYSTEM,  SYSTEM; Confirmation:   Confirmed ; Classification:   Interdisciplinary ; Code:   [id number] ; Last Updated:   03/28/2019 2:23 EDT ; Life Cycle Date:   03/28/2019 ; Life Cycle Status:   Active ; Vocabulary:   SNOMED CT   ; Comments:        03/28/2019 2:23 - SYSTEM,  SYSTEM  Problem added automatically by system based on initiation of Alteration in Comfort Plan of Care      Hypertension (IMO  :13508 )  Name of Problem:   Hypertension ; Recorder:   CHARLIES, RN, JENNETTE; Confirmation:   Confirmed ; Classification:   Medical ; Code:   [id number] ; Contributor System:   PowerChart ; Last Updated:   11/08/2015 5:36 EST ; Life Cycle Date:   11/08/2015 ; Life Cycle Status:   Active ; Vocabulary:   IMO        Shortness of breath (IMO  :72576 )  Name of Problem:   Shortness of breath ; Recorder:   SYSTEM,  SYSTEM; Confirmation:   Confirmed ; Classification:   Patient Stated ; Code:   72576 ; Last Updated:   04/17/2019 15:58 EDT ; Life Cycle Date:   04/17/2019 ; Life Cycle Status:   Active ; Vocabulary:   IMO        Sickle cell (SNOMED CT  :16827988 )  Name of Problem:   Sickle cell ; Recorder:   ALBAYALDE, RN, JENNETTE; Confirmation:   Confirmed ; Classification:   Medical ; Code:   [id number] ; Contributor System:   PowerChart ; Last Updated:   11/08/2015 5:36 EST ; Life Cycle Date:   11/08/2015 ; Life  Cycle Status:   Active ; Vocabulary:   SNOMED CT          Diagnoses(Active)    Shortness of breath  Date:   09/25/2019 ; Diagnosis Type:   Reason For Visit ; Confirmation:   Complaint of ; Clinical Dx:   Shortness of breath ; Classification:   Medical ; Clinical Service:   Emergency medicine ; Code:   PNED ; Probability:   0 ; Diagnosis Code:   [id number]             -    Procedure History   (As Of: 09/25/2019 19:17:45 EST)     Anesthesia Minutes:   0 ; Procedure Name:   Port ; Procedure Minutes:   0 ; Last Reviewed Dt/Tm:   09/25/2019 19:17:35 EST            Anesthesia Minutes:   0 ; Procedure Name:   thoracotomy ; Procedure Minutes:  0 ; Last Reviewed Dt/Tm:   09/25/2019 19:17:35 EST            Anesthesia Minutes:   0 ; Procedure Name:   hernia repair ; Procedure Minutes:   0 ; Last Reviewed Dt/Tm:   09/25/2019 19:17:35 EST            Anesthesia Minutes:   0 ; Procedure Name:   splenectomy ; Procedure Minutes:   0 ; Last Reviewed Dt/Tm:   09/25/2019 19:17:35 EST            Anesthesia Minutes:   0 ; Procedure Name:   Cholecystectomy; ; Procedure Minutes:   0 ; Last Reviewed Dt/Tm:   09/25/2019 19:17:35 EST            [facility] Fall Risk Assessment Tool   Hx of falling last 3 months ED Fall :   No   Patient confused or disoriented ED Fall :   No   Patient intoxicated or sedated ED Fall :   No   Patient impaired gait ED Fall :   No   Use a mobility assistance device ED Fall :   No   Patient altered elimination ED Fall :   No   [facility] ED Fall Score :   0    Cross, RN, Delon LABOR - 09/25/2019 19:17 EST   ED Advance Directive   Advance Directive :   No   Cross, RN, Delon A - 09/25/2019 19:17 EST   Med Hx   Medication List   (As Of: 09/25/2019 19:17:45 EST)   Home Meds    sodium bicarbonate  :   sodium bicarbonate ; Status:   Documented ; Ordered As Mnemonic:   sodium bicarbonate 650 mg oral tablet ; Simple Display Line:   325 mg, 0.5 tabs, Oral, BID, 0 Refill(s) ; Ordering Provider:    TAMALA HINDERS; Catalog Code:   sodium bicarbonate ; Order Dt/Tm:   04/18/2019 14:27:46 EDT          cyclobenzaprine  :   cyclobenzaprine ; Status:   Documented ; Ordered As Mnemonic:   cyclobenzaprine 5 mg oral tablet ; Simple Display Line:   5 mg, 1 tabs, Oral, TID, for 14 days, 42 tabs, 0 Refill(s) ; Catalog Code:   cyclobenzaprine ; Order Dt/Tm:   04/17/2019 19:33:49 EDT ; Comment:    THIS MEDICATION IS ASSOCIATED   WITH   AN INCREASED RISK OF FALLS.          albuterol  :   albuterol ; Status:   Documented ; Ordered As Mnemonic:   Proventil HFA 90 mcg/inh inhalation aerosol ; Simple Display Line:   2 puffs, Inhale, q4hr, PRN, 6.7 g, 0 Refill(s) ; Catalog Code:   albuterol ; Order Dt/Tm:   03/28/2019 09:31:25 EDT          bisacodyl  :   bisacodyl ; Status:   Documented ; Ordered As Mnemonic:   Dulcolax Laxative 5 mg oral delayed release tablet ; Simple Display Line:   5 mg, 1 tabs, Oral, Daily, PRN, 0 Refill(s) ; Catalog Code:   bisacodyl ; Order Dt/Tm:   03/28/2019 09:31:31 EDT ; Comment:   DO NOT CRUSH          citalopram  :   citalopram ; Status:   Documented ; Ordered As Mnemonic:   CeleXA 40 mg oral tablet ; Simple Display Line:   40 mg, 1 tabs, Oral, Daily, 30 tabs, 0 Refill(s) ;  Catalog Code:   citalopram ; Order Dt/Tm:   03/28/2019 09:26:41 EDT ; Comment:   SOUND ALIKE / LOOK ALIKE - VERIFY DRUG          clonazePAM  :   clonazePAM ; Status:   Documented ; Ordered As Mnemonic:   KlonoPIN 0.5 mg oral tablet ; Simple Display Line:   0.5 mg, 1 tabs, Oral, Once a Day (at bedtime), 0 Refill(s) ; Catalog Code:   clonazePAM ; Order Dt/Tm:   03/28/2019 09:26:46 EDT ; Comment:   SOUND ALIKE / LOOK ALIKE - VERIFY DRUG  SOUND ALIKE / LOOK ALIKE - VERIFY DRUG          diphenhydrAMINE  :   diphenhydrAMINE ; Status:   Documented ; Ordered As Mnemonic:   Benadryl 25 mg oral tablet ; Simple Display Line:   50 mg, 2 tabs, Oral, PRN: prn, 0 Refill(s) ; Catalog Code:   diphenhydrAMINE ; Order Dt/Tm:   03/28/2019 09:31:07  EDT          docusate  :   docusate ; Status:   Documented ; Ordered As Mnemonic:   Colace 100 mg oral capsule ; Simple Display Line:   100 mg, 1 caps, Oral, BID, PRN: as needed for constipation, 0 Refill(s) ; Catalog Code:   docusate ; Order Dt/Tm:   03/28/2019 09:31:40 EDT          fentaNYL  :   fentaNYL ; Status:   Documented ; Ordered As Mnemonic:   fentaNYL 50 mcg/hr transdermal film, extended release ; Simple Display Line:   1 patches, TD, q72hr, 0 Refill(s) ; Catalog Code:   fentaNYL ; Order Dt/Tm:   03/28/2019 09:27:02 EDT          folic acid  :   folic acid ; Status:   Documented ; Ordered As Mnemonic:   folic acid 1 mg oral tablet ; Simple Display Line:   1 mg, 1 tabs, Oral, Daily, 0 Refill(s) ; Catalog Code:   folic acid ; Order Dt/Tm:   03/28/2019 90:71:74 EDT          ibuprofen  :   ibuprofen ; Status:   Documented ; Ordered As Mnemonic:   ibuprofen 800 mg oral tablet ; Simple Display Line:   800 mg, 1 tabs, Oral, TID, PRN: for pain, 30 tabs, 0 Refill(s) ; Catalog Code:   ibuprofen ; Order Dt/Tm:   03/28/2019 09:28:46 EDT          mirtazapine  :   mirtazapine ; Status:   Documented ; Ordered As Mnemonic:   Remeron 15 mg oral tablet ; Simple Display Line:   15 mg, 1 tabs, Oral, Once a Day (at bedtime), 30 tabs, 0 Refill(s) ; Catalog Code:   mirtazapine ; Order Dt/Tm:   03/28/2019 90:71:47 EDT          oxyCODONE  :   oxyCODONE ; Status:   Documented ; Ordered As Mnemonic:   oxyCODONE 30 mg oral tablet ; Simple Display Line:   30 mg, 1 tabs, Oral, q4hr, PRN: for pain, 0 Refill(s) ; Catalog Code:   oxyCODONE ; Order Dt/Tm:   03/28/2019 09:29:01 EDT          promethazine  :   promethazine ; Status:   Documented ; Ordered As Mnemonic:   promethazine 25 mg oral tablet ; Simple Display Line:   25 mg, 1 tabs, Oral, q6hr, PRN: as needed for nausea/vomiting, 0 Refill(s) ; Catalog Code:  promethazine ; Order Dt/Tm:   03/28/2019 09:32:47 EDT

## 2019-09-25 NOTE — ED Notes (Signed)
ED Triage Note       ED Triage Adult Entered On:  09/25/2019 18:51 EST    Performed On:  09/25/2019 18:49 EST by Shirlean Kelly H               Triage   Chief Complaint :   pt c/o cough and congestion, she has a hx of sickle cell and is also c/o chest pain and SOB   Numeric Rating Pain Scale :   8   [place] Mode of Arrival :   Private vehicle   Infectious Disease Documentation :   Document assessment   Temperature Oral :   37.4 degC(Converted to: 99.3 degF)  (HI)    Heart Rate Monitored :   107 bpm (HI)    Respiratory Rate :   24 br/min (HI)    Systolic Blood Pressure :   139 mmHg   Diastolic Blood Pressure :   96 mmHg (HI)    SpO2 :   100 %   Oxygen Therapy :   Room air   Patient presentation :   HR > 100, RR > 22   Chief Complaint or Presentation suggest infection :   Yes   Weight Dosing :   58.2 kg(Converted to: 128 lb 5 oz)    Height :   160 cm(Converted to: 5 ft 3 in)    Body Mass Index Dosing :   23 kg/m2   Patrick North - 09/25/2019 18:49 EST   DCP GENERIC CODE   Tracking Acuity :   3   Tracking Group :   ED [id number] Tracking Group   Patrick North - 09/25/2019 18:49 EST   ED General Section :   Document assessment   Pregnancy Status :   Patient denies   Approximate Last Menstrual Period :   depo   ED Allergies Section :   Document assessment   ED Reason for Visit Section :   Document assessment   Patrick North - 09/25/2019 18:49 EST   ID Risk Screen Symptoms   Recent Travel History :   No recent travel   Close Contact with COVID-19 ID :   No   Last 14 days COVID-19 ID :   No   TB Symptom Screen :   No symptoms   C. diff Symptom/History ID :   Neither of the above   Patrick North - 09/25/2019 18:49 EST   Allergies   (As Of: 09/25/2019 18:51:24 EST)   Allergies (Active)   Bactrim  Estimated Onset Date:   Unspecified ; Reactions:   Itching ; Created By:   Malena Peer; Reaction Status:   Active ; Category:   Drug ; Substance:   Bactrim ; Type:   Allergy ; Severity:   Moderate ; Updated By:   Malena Peer; Reviewed Date:   07/24/2019 10:36 EDT      contrast media (iodine-based)  Estimated Onset Date:   Unspecified ; Created ByRock Nephew, RN, JENNETTE; Reaction Status:   Active ; Category:   Drug ; Substance:   contrast media (iodine-based) ; Type:   Allergy ; Severity:   Unknown ; Updated By:   Irene Limbo; Reviewed Date:   07/24/2019 10:36 EDT      fentaNYL  Estimated Onset Date:   Unspecified ; Created ByRock Nephew, RN, JENNETTE; Reaction Status:   Active ; Category:  Drug ; Substance:   fentaNYL ; Type:   Allergy ; Severity:   Unknown ; Updated By:   Recardo Evangelist; Reviewed Date:   07/24/2019 10:36 EDT      morphine  Estimated Onset Date:   Unspecified ; Created ByOkey Dupre, RN, JENNETTE; Reaction Status:   Active ; Category:   Drug ; Substance:   morphine ; Type:   Allergy ; Severity:   Unknown ; Updated By:   Recardo Evangelist; Reviewed Date:   07/24/2019 10:36 EDT      Rocephin  Estimated Onset Date:   Unspecified ; Created By:   Okey Dupre RN, JENNETTE; Reaction Status:   Active ; Category:   Drug ; Substance:   Rocephin ; Type:   Allergy ; Severity:   Unknown ; Updated By:   Recardo Evangelist; Reviewed Date:   07/24/2019 10:36 EDT        Psycho-Social   Last 3 mo, thoughts killing self/others :   Patient denies   Elly Modena - 09/25/2019 18:49 EST   ED Reason for Visit   (As Of: 09/25/2019 18:51:24 EST)   Problems(Active)    Alteration in comfort: pain (SNOMED CT  :89381017 )  Name of Problem:   Alteration in comfort: pain ; Recorder:   SYSTEM,  SYSTEM; Confirmation:   Confirmed ; Classification:   Interdisciplinary ; Code:   [id number] ; Last Updated:   03/28/2019 2:23 EDT ; Life Cycle Date:   03/28/2019 ; Life Cycle Status:   Active ; Vocabulary:   SNOMED CT   ; Comments:        03/28/2019 2:23 - SYSTEM,  SYSTEM  Problem added automatically by system based on initiation of Alteration in Comfort Plan of Care      Hypertension (IMO  :77824 )  Name of  Problem:   Hypertension ; Recorder:   Okey Dupre, RN, JENNETTE; Confirmation:   Confirmed ; Classification:   Medical ; Code:   [phone] ; Contributor System:   Conservation officer, nature ; Last Updated:   11/08/2015 5:36 EST ; Life Cycle Date:   11/08/2015 ; Life Cycle Status:   Active ; Vocabulary:   IMO        Shortness of breath (IMO  :14431 )  Name of Problem:   Shortness of breath ; Recorder:   SYSTEM,  SYSTEM; Confirmation:   Confirmed ; Classification:   Patient Stated ; Code:   54008 ; Last Updated:   04/17/2019 15:58 EDT ; Life Cycle Date:   04/17/2019 ; Life Cycle Status:   Active ; Vocabulary:   IMO        Sickle cell (SNOMED CT  :67619509 )  Name of Problem:   Sickle cell ; Recorder:   ALBAYALDE, RN, JENNETTE; Confirmation:   Confirmed ; Classification:   Medical ; Code:   [phone] ; Contributor System:   Conservation officer, nature ; Last Updated:   11/08/2015 5:36 EST ; Life Cycle Date:   11/08/2015 ; Life Cycle Status:   Active ; Vocabulary:   SNOMED CT          Diagnoses(Active)    Shortness of breath  Date:   09/25/2019 ; Diagnosis Type:   Reason For Visit ; Confirmation:   Complaint of ; Clinical Dx:   Shortness of breath ; Classification:   Medical ; Clinical Service:   Emergency medicine ; Code:   PNED ; Probability:   0 ; Diagnosis Code:   [id number]

## 2019-09-25 NOTE — Discharge Summary (Signed)
 ED Clinical Summary                     [facility]  [address]  [place], [place] [address]  [phone]          PERSON INFORMATION  Name: Judith [name], Judith [name] Age:  33 Years DOB: [birth year]   Sex: Female Language: English PCP: PCP,  NONE   Marital Status: Single Phone: [phone] Med Service: MED-Medicine   MRN: [id number] Acct# [id number] Arrival: 09/25/2019 18:28:00   Visit Reason: Shortness of breath; SICKLE CELL PAIN/SOB Acuity: 3 LOS: 000 04:54   Address:    [address] [place] [address]   Diagnosis:    Bronchospasm; Sickle cell crisis; Upper respiratory infection  Medications:          New Medications  Printed Prescriptions  azithromycin (Zithromax Z-Pak 250 mg oral tablet) as directed on package labeling. Refills: 0.  Last Dose:____________________  predniSONE (predniSONE 20 mg oral tablet) 2 Tabs Oral (given by mouth) every day for 4 Days. Refills: 0.  Last Dose:____________________  Medications that have not changed  Other Medications  albuterol (Proventil HFA 90 mcg/inh inhalation aerosol) 2 Puffs Inhale (breathe in) every 4 hours as needed.  Last Dose:____________________  bisacodyl (Dulcolax Laxative 5 mg oral delayed release tablet) 1 Tabs Oral (given by mouth) every day as needed., DO NOT CRUSH  Last Dose:____________________  citalopram (CeleXA 40 mg oral tablet) 1 Tabs Oral (given by mouth) every day., SOUND ALIKE / LOOK ALIKE - VERIFY DRUG  Last Dose:____________________  clonazePAM (KlonoPIN 0.5 mg oral tablet) 1 Tabs Oral (given by mouth) Once a Day (at bedtime)., SOUND ALIKE / LOOK ALIKE - VERIFY DRUG   SOUND ALIKE / LOOK ALIKE - VERIFY DRUG  Last Dose:____________________  cyclobenzaprine (cyclobenzaprine 5 mg oral tablet) 1 Tabs Oral (given by mouth) 3 times a day for 14 Days.,  THIS MEDICATION IS ASSOCIATED  WITH  AN INCREASED RISK OF FALLS.  Last Dose:____________________  diphenhydrAMINE (Benadryl 25 mg oral tablet) 2 Tabs Oral (given by  mouth) as needed prn.  Last Dose:____________________  docusate (Colace 100 mg oral capsule) 1 Capsules Oral (given by mouth) 2 times a day as needed as needed for constipation.  Last Dose:____________________  fentaNYL (fentaNYL 50 mcg/hr transdermal film, extended release) 1 Patches Transdermal (apply on the skin) every 72 hours.  Last Dose:____________________  folic acid (folic acid 1 mg oral tablet) 1 Tabs Oral (given by mouth) every day.  Last Dose:____________________  ibuprofen (ibuprofen 800 mg oral tablet) 1 Tabs Oral (given by mouth) 3 times a day as needed for pain.  Last Dose:____________________  mirtazapine (Remeron 15 mg oral tablet) 1 Tabs Oral (given by mouth) Once a Day (at bedtime).  Last Dose:____________________  oxyCODONE (oxyCODONE 30 mg oral tablet) 1 Tabs Oral (given by mouth) every 4 hours as needed for pain.  Last Dose:____________________  promethazine (promethazine 25 mg oral tablet) 1 Tabs Oral (given by mouth) every 6 hours as needed as needed for nausea/vomiting.  Last Dose:____________________  sodium bicarbonate (sodium bicarbonate 650 mg oral tablet) 0.5 Tabs Oral (given by mouth) 2 times a day.  Last Dose:____________________      Medications Administered During Visit:                Medication Dose Route   Sodium Chloride 0.9% 1000 mL IV Piggyback   hydromorphone 1 mg IV Push   ondansetron 4 mg IV Push  albuterol 720 mcg Inhale   methylPREDNISolone 125 mg IV Push   diphenhydrAMINE 37.5 mg IV Push   hydromorphone 1 mg IV Push               Allergies      Rocephin      fentaNYL      morphine      contrast media (iodine-based)      Bactrim (Itching)      Major Tests and Procedures:  The following procedures and tests were performed during your ED visit.  COMMON PROCEDURES%>  COMMON PROCEDURES COMMENTS%>                PROVIDER INFORMATION               Provider Role Assigned Sampson Epps, RN, Delon LABOR ED Nurse 09/25/2019 19:03:12    VASSIE HUMMER ED Provider  09/25/2019 19:32:31        Attending Physician:  PARASCHOS-MD,  THEODORE      Admit Doc  PARASCHOS-MD,  THEODORE     Consulting Doc       VITALS INFORMATION  Vital Sign Triage Latest   Temp Oral ORAL_1%> ORAL%>   Temp Temporal TEMPORAL_1%> TEMPORAL%>   Temp Intravascular INTRAVASCULAR_1%> INTRAVASCULAR%>   Temp Axillary AXILLARY_1%> AXILLARY%>   Temp Rectal RECTAL_1%> RECTAL%>   02 Sat 100 % 98 %   Respiratory Rate RATE_1%> RATE%>   Peripheral Pulse Rate PULSE RATE_1%> PULSE RATE%>   Apical Heart Rate HEART RATE_1%> HEART RATE%>   Blood Pressure BLOOD PRESSURE_1%>/ BLOOD PRESSURE_1%>96 mmHg BLOOD PRESSURE%> / BLOOD PRESSURE%>60 mmHg                 Immunizations      No Immunizations Documented This Visit          DISCHARGE INFORMATION   Discharge Disposition: H Outpt-Sent Home   Discharge Location:  Home   Discharge Date and Time:  09/25/2019 23:22:03   ED Checkout Date and Time:  09/25/2019 23:22:03     DEPART REASON INCOMPLETE INFORMATION               Depart Action Incomplete Reason   Interactive View/I&O Recently assessed   Patient Education Recently assessed               Problems      Active           Hypertension          Sickle cell              Smoking Status      Former smoker         PATIENT EDUCATION INFORMATION  Instructions:          Follow up:                   With: Address: When:   [name], View Only Providers - No Access [address] [place], [place] [address]  [phone] Business (1)    Comments:   no later than Monday 09/29/19 for further evaluation & management of your symptoms. This is important!     Take Azithromycin (antibiotic) asnd Prednisone (steroid) as prescribed, on a scheduled basis, until you have completed the entire prescriptions.     Use the albuterol inhaler (provided) as follows: 2 puffs every four hours, as needed, for wheezing and/or coughing and/or shortness of breath.     Otherwise, continue any other previously prescribed medicines as directed  in the past.      Return to the Emergency Department anytime for intolerable pain, worsening difficulty breathing, fever over 100.4 F, or ANY other concerns.              ED PROVIDER DOCUMENTATION

## 2019-09-25 NOTE — ED Notes (Signed)
ED Patient Education Note     Patient Education Materials Follows:

## 2019-09-25 NOTE — ED Provider Notes (Signed)
ED General Note        Patient:   Judith [name], Judith [name]            MRN: [id number]            FIN: [id number]               Age:   33 years     Sex:  Female     DOB:  1986/06/16   Associated Diagnoses:   Upper respiratory infection; Sickle cell crisis; Bronchospasm   Author:   Greig Castilla      Basic Information   Time seen: Provider Seen (ST)   ED Provider/Time:    Miya Luviano-MD,  Enez Monahan / 09/25/2019 19:32  .   Additional information: Chief Complaint from Nursing Triage Note   Chief Complaint  Chief Complaint: pt c/o cough and congestion, she has a hx of sickle cell and is also c/o chest pain and SOB (09/25/19 18:49:00).      History of Present Illness   Patient has a history of sickle cell disease.   She complains of sore throat, upper respiratory congestion, and productive cough (yellow sputum), chest pain (laterally, both sides), dyspnea on exertion, N/V, diffuse back pain & bilateral hip pain.  These symptoms reportedly all started Tuesday (09/23/19) AM.  Patient notes that her chest pain has been constant since onset, now approaching 60 hours.  She estimates that she has now had approximately 4 episodes of vomiting over the past 24 hours, noting that all emesis has been nonbloody.  Patient states that her vomiting has been both posttussive and due to nausea.  Patient states that the pain throughout her back & hips remind her of the pain she typically experiences with her sickle cell crises.  Patient denies any recent/associated fever, diarrhea, dysuria, urinary frequency, leg pain, or leg swelling.   Patient denies any known exposure to the novel coronavirus responsible for the ongoing COVID-19 pandemic.Marland Kitchen        Review of Systems   Constitutional symptoms:  Sweats, No fever.    ENMT symptoms:  Sore throat, nasal congestion.    Respiratory symptoms:  Cough, Dyspnea on exertion, No shortness of breath at rest, no orthopnea.    Cardiovascular symptoms:  Chest pain.   Gastrointestinal symptoms:  Nausea, vomiting,  No hematemesis, no diarrhea, no abdominal pain.    Genitourinary symptoms:  No dysuria, no urinary frequency.   Musculoskeletal symptoms:  Back pain, Joint pain.    Neurologic symptoms:  Headache.             Additional review of systems information: All other systems reviewed and otherwise negative.      Health Status   Allergies:    Allergic Reactions (All)  Moderate  Bactrim- Itching.  Unknown  Contrast media (iodine-based)- No reactions were documented.  FentaNYL- No reactions were documented.  Morphine- No reactions were documented.  Rocephin- No reactions were documented..      Past Medical/ Family/ Social History   Medical history:    No active or resolved past medical history items have been selected or recorded., Reviewed as documented in chart.   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  Cholecystectomy; (30865).  hernia repair.  thoracotomy., Reviewed as documented in chart.   Family history: Reviewed as documented in chart.   Social history: Reviewed as documented in chart.   Problem list:    Active Problems (4)  Alteration in comfort: pain   Hypertension  Shortness of breath   Sickle cell   .      Physical Examination               Vital Signs   Vital Signs   86/57/8469 62:95 EST Systolic Blood Pressure 284 mmHg    Diastolic Blood Pressure 70 mmHg    Heart Rate Monitored 126 bpm  HI    Respiratory Rate 29 br/min  HI    SpO2 100 %   13/24/4010 27:25 EST Systolic Blood Pressure 366 mmHg    Diastolic Blood Pressure 50 mmHg  LOW    Heart Rate Monitored 96 bpm    Respiratory Rate 10 br/min  LOW    SpO2 99 %   09/25/2019 19:25 EST Heart Rate Monitored 99 bpm    Respiratory Rate 21 br/min  HI    SpO2 99 %   09/25/2019 19:17 EST SpO2 100 %   44/01/4741 59:56 EST Systolic Blood Pressure 387 mmHg    Diastolic Blood Pressure 96 mmHg  HI    Temperature Oral 37.4 degC  HI    Heart Rate Monitored 107 bpm  HI    Respiratory Rate 24 br/min  HI    SpO2 100 %   .   Measurements   09/25/2019 18:51 EST Body  Mass Index est meas 22.73 kg/m2    Body Mass Index Measured 22.73 kg/m2   09/25/2019 18:49 EST Height/Length Measured 160 cm    Weight Dosing 58.2 kg   .   Basic Oxygen Information   09/25/2019 19:51 EST SpO2 100 %    Oxygen Therapy Room air   09/25/2019 19:30 EST SpO2 99 %    Oxygen Therapy Room air   09/25/2019 19:25 EST SpO2 99 %    Oxygen Therapy Room air   09/25/2019 19:17 EST SpO2 100 %    Oxygen Therapy Room air   09/25/2019 18:49 EST SpO2 100 %    Oxygen Therapy Room air   .   General:  Awake, alert.  Wearing a mask over nose & mouth.  No apparent distress at rest..   Skin:  Warm, dry.  A large, concave, well-healed soft tissue defect is present over the base of the left lung posteriorly.  Visualized skin otherwise unremarkable; no rash..   Head:  Normocephalic, atraumatic.Marland Kitchen   Neck:  Supple; no meningismus.  No swelling or lymphadenopathy noted..   Eye:  PERRL, EOMI OU.  Conjunctivae pale OU; sclerae normal OU..   Ears, nose, mouth and throat:  Oral mucosa moist.  Pharynx diffusely minimally erythematous, without swelling or exudate..   Cardiovascular:  Regular rhythm; tachycardic @ 110/min.  Normal S1/S2, without murmur, rub, or gallop.Marland Kitchen   Respiratory:  Good air movement.  Mild expiratory wheezes scattered throughout bilateral lung fields.  Mild right upper/mid rhonchi noted..   Chest wall:  Chest wall tender along midaxillary lines bilaterally; palpation exactly reproduces patient's pain, per patient..   Back:  CVA tenderness is present bilaterally..   Musculoskeletal:  No long-bone deformities or focal bony tenderness is present. Normal AROM. No peripheral edema present.  Calves symmetric & nontender bilaterally. Distal pulses normal & equal bilaterally..   Gastrointestinal:  Soft, nondistended, nontender throughout.  No guarding, rebound tenderness, or unusual masses noted.  Normoactive bowel sounds..   Neurological:  Awake, alert, and oriented in all spheres. CN II-XII intact bilaterally. Extremity  strength normal & equal bilaterally.Marland Kitchen   Psychiatric:  Cooperative & appropriate..      Medical Decision Making  Documents reviewed:  [facility] records, for comparison laboratory values.   Electrocardiogram:  Emergency Provider interpretation performed by me, I personally interpreted ECG.  Please see saved EKG report..    Notes:  2159 hours:   Patient had told me that although she has an allergy to contrast dye, she has tolerated it well in the past when premedicated with Benadryl.  Ordered patient's CTA accordingly.   At 2153 hours, CT tech French Ana(Tracy) called and stated that because radiology records suggest patient has developed hives in the past despite premedication with Benadryl & steroids, she called the radiologist Julian Reil([place]), who told her not to administer CT dye to patient.  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  She states that she is altogether compliant with her prescribed Eliquis therapy and has not missed any doses.  Patient also states that her chest pain & shortness of breath have now resolved following treatment rendered in the ED.  Upon repeat examination, patient's breath sounds are now clear bilaterally, without wheezing, rales, or rhonchi..      Reexamination/ Reevaluation   Vital signs   Basic Oxygen Information   09/25/2019 19:51 EST SpO2 100 %    Oxygen Therapy Room air   09/25/2019 19:30 EST SpO2 99 %    Oxygen Therapy Room air   09/25/2019 19:25 EST SpO2 99 %    Oxygen Therapy Room air   09/25/2019 19:17 EST SpO2 100 %    Oxygen Therapy Room air   09/25/2019 18:49 EST SpO2 100 %    Oxygen Therapy Room air      Notes: 2240 hours:  As patient arrived back from radiology, she stated that she now feels "much better" and would like to be discharged home..      Impression and Plan   Diagnosis   Upper respiratory infection (ICD10-CM J06.9, Discharge, Medical)    Bronchospasm (ICD10-CM J98.01, Discharge, Medical)   Sickle cell crisis (ICD10-CM D57.00, Discharge, Medical)   Plan   Condition: Improved, Stable.    Disposition: Discharged: Time  09/25/2019 22:47:00, to home.    Prescriptions: Launch prescriptions   Pharmacy:  Zithromax Z-Pak 250 mg oral tablet (Prescribe): See Instructions, as directed on package labeling, 1 packets, 0 Refill(s)  predniSONE 20 mg oral tablet (Prescribe): 40 mg, 2 tabs, Oral, Daily, for 4 days, 8 tabs, 0 Refill(s).    Follow up with: Jackquline DenmarkEMEIA MARTIN, View Only Providers - No Access no later than Monday 09/29/19 for further evaluation & management of your symptoms.  This is important!    Take Azithromycin (antibiotic) asnd Prednisone (steroid) as prescribed, on a scheduled basis, until you have completed the entire prescriptions.    Use the albuterol inhaler (provided) as follows:  2 puffs every four hours, as needed, for wheezing and/or coughing and/or shortness of breath.    Otherwise, continue any other previously prescribed medicines as directed in the past.    Return to the Emergency Department anytime for intolerable pain, worsening difficulty breathing, fever over 100.4 F, or ANY other concerns..    Counseled: Patient, Regarding diagnosis, Regarding diagnostic results, Regarding treatment plan, Patient indicated understanding of instructions.    Signature Line     Electronically Signed on 09/25/2019 11:39 PM EST   ________________________________________________   Sopheap Boehle-MD,  Normand SloopHEODORE  Modified by: Greig Castilla on 09/25/2019 08:21 PM EST      Modified by: Bintou Lafata-MD,  Aryeh Butterfield on 09/25/2019 08:25 PM EST      Modified by: Leeanna Slaby-MD,  Rashika Bettes on 09/25/2019 08:27 PM EST      Modified by: Arneta Mahmood-MD,  Rich Paprocki on 09/25/2019 09:58 PM EST      Modified by: Emilliano Dilworth-MD,  Ancel Easler on 09/25/2019 10:12 PM EST      Modified by: Hudsen Fei-MD,  Rudi Bunyard on 09/25/2019 11:39 PM EST

## 2019-09-26 LAB — CBC
Hematocrit: 20.6 % — CL (ref 34.0–47.0)
Hemoglobin: 7.2 g/dL — ABNORMAL LOW (ref 11.5–15.7)
MCH: 28.3 pg (ref 27.0–34.5)
MCHC: 35 g/dL (ref 32.0–36.0)
MCV: 81.1 fL (ref 81.0–99.0)
MPV: 9.7 fL (ref 7.2–13.2)
NRBC Absolute: 0.1 10*3/uL — ABNORMAL HIGH (ref 0.000–0.012)
NRBC Automated: 0.5 % — ABNORMAL HIGH (ref 0.0–0.2)
Platelets: 362 10*3/uL (ref 140–440)
RBC: 2.54 x10e6/mcL — ABNORMAL LOW (ref 3.60–5.20)
RDW: 16.9 % — ABNORMAL HIGH (ref 11.0–16.0)
WBC: 19.4 10*3/uL — ABNORMAL HIGH (ref 3.8–10.6)

## 2019-09-26 LAB — URINALYSIS WITH MICROSCOPIC
Bilirubin Urine: NEGATIVE
Blood, Urine: NEGATIVE
Glucose, UA: NEGATIVE mg/dL
Ketones, Urine: NEGATIVE mg/dL
MUCUS, URINE: NONE SEEN /LPF
Nitrite, Urine: NEGATIVE
Specific Gravity, UA: 1.025 (ref 1.003–1.035)
Urobilinogen, Urine: 0.2 EU/dL
pH, UA: 6 (ref 4.5–8.0)

## 2019-09-26 LAB — ADD ON LAB TEST

## 2019-09-26 LAB — RETICULOCYTES
RBC: 2.54 x10e6/mcL — ABNORMAL LOW (ref 3.60–5.20)
Retic Ct Abs: 0.2518 /mcL — ABNORMAL HIGH (ref 0.0210–0.1080)
Retic Ct Pct: 9.9 % — ABNORMAL HIGH (ref 0.5–2.0)

## 2019-09-26 LAB — COVID-19, SURVEILLANCE (ASYMPTOMATIC/NO EXPOSURE, OR TEST OF CURE)
Lot/Kit Number: 706157
SARS Cov2 Ag FIA: NEGATIVE

## 2019-09-26 LAB — COMPREHENSIVE METABOLIC PANEL
ALT: 17 U/L (ref 0–33)
AST: 23 U/L (ref 0–32)
Albumin/Globulin Ratio: 1.32 mmol/L (ref 1.00–2.00)
Albumin: 4.1 g/dL (ref 3.5–5.2)
Alk Phosphatase: 85 U/L (ref 35–117)
Anion Gap: 12 mmol/L (ref 2–17)
BUN: 11 mg/dL (ref 6–20)
CO2: 16 mmol/L — ABNORMAL LOW (ref 22–29)
Calcium: 8.3 mg/dL — ABNORMAL LOW (ref 8.6–10.0)
Chloride: 106 mmol/L (ref 98–107)
Creatinine: 0.5 mg/dL (ref 0.5–0.9)
GFR African American: 146 mL/min/{1.73_m2} (ref >=90)
GFR Non-African American: 126 mL/min/{1.73_m2} (ref >=90)
Globulin: 3.1 g/dL (ref 1.9–4.4)
Glucose: 112 mg/dL — ABNORMAL HIGH (ref 70–99)
OSMOLALITY CALCULATED: 268 mOsm/kg — ABNORMAL LOW (ref 270–287)
Potassium: 3.5 mmol/L (ref 3.5–5.3)
Sodium: 134 mmol/L — ABNORMAL LOW (ref 135–145)
Total Bilirubin: 0.8 mg/dL (ref 0.00–1.20)
Total Protein: 7.2 g/dL (ref 6.4–8.3)

## 2019-09-26 LAB — TROPONIN T: Troponin T: <0.01 ng/mL (ref 0.000–0.010)

## 2019-09-26 LAB — RAPID STREP SCREEN
RAPID STREP RESULT: NEGATIVE
Rapid Strep A Screen: NEGATIVE

## 2019-09-26 LAB — LACTIC ACID: Lactic Acid: 0.6 mmol/L (ref 0.5–2.0)

## 2019-09-26 LAB — POC PREGNANCY UR-QUAL: Preg Test, Ur: NEGATIVE

## 2019-09-26 LAB — N TERMINAL PROBNP (AKA NTPROBNP): NT Pro-BNP: 84 pg/mL (ref 0–125)

## 2019-09-27 LAB — CULTURE, BETA STREP CONFIRM PLATES

## 2019-11-09 LAB — CBC
Hematocrit: 24.8 % — ABNORMAL LOW (ref 34.0–47.0)
Hemoglobin: 8.3 g/dL — ABNORMAL LOW (ref 11.5–15.7)
MCH: 27.3 pg (ref 27.0–34.5)
MCHC: 33.5 g/dL (ref 32.0–36.0)
MCV: 81.6 fL (ref 81.0–99.0)
MPV: 9.1 fL (ref 7.2–13.2)
NRBC Absolute: 0.02 10*3/uL — ABNORMAL HIGH (ref 0.000–0.012)
NRBC Automated: 0.1 % (ref 0.0–0.2)
Platelets: 530 10*3/uL — ABNORMAL HIGH (ref 140–440)
RBC: 3.04 x10e6/mcL — ABNORMAL LOW (ref 3.60–5.20)
RDW: 18.2 % — ABNORMAL HIGH (ref 11.0–16.0)
WBC: 13.9 10*3/uL — ABNORMAL HIGH (ref 3.8–10.6)

## 2019-11-09 LAB — BASIC METABOLIC PANEL
Anion Gap: 12 mmol/L (ref 2–17)
BUN: 10 mg/dL (ref 6–20)
CO2: 14 mmol/L — ABNORMAL LOW (ref 22–29)
Calcium: 8.9 mg/dL (ref 8.6–10.0)
Chloride: 106 mmol/L (ref 98–107)
Creatinine: 0.6 mg/dL (ref 0.5–0.9)
GFR African American: 138 mL/min/{1.73_m2} (ref >=90)
GFR Non-African American: 119 mL/min/{1.73_m2} (ref >=90)
Glucose: 132 mg/dL — ABNORMAL HIGH (ref 70–99)
OSMOLALITY CALCULATED: 265 mOsm/kg — ABNORMAL LOW (ref 270–287)
Potassium: 4.2 mmol/L (ref 3.5–5.3)
Sodium: 132 mmol/L — ABNORMAL LOW (ref 135–145)

## 2019-11-09 LAB — RETICULOCYTES
RBC: 3.04 x10e6/mcL — ABNORMAL LOW (ref 3.60–5.20)
Retic Ct Abs: 0.1742 /mcL — ABNORMAL HIGH (ref 0.0210–0.1080)
Retic Ct Pct: 5.7 % — ABNORMAL HIGH (ref 0.5–2.0)

## 2019-11-09 LAB — IMMATURE CELLS
IMMATURE PLT ABSOLUTE: 9 10*3/uL
IMMATURE PLT PERCENT: 1.7 % (ref 1.2–8.6)

## 2019-11-09 NOTE — ED Notes (Signed)
ED Patient Education Note     Patient Education Materials Follows:  Home Health Care     Sickle Cell Anemia, Adult    Sickle cell anemia is a condition in which red blood cells have an abnormal "sickle" shape. This abnormal shape shortens the cells' life span, which results in a lower than normal concentration of red blood cells in the blood. The sickle shape also causes the cells to clump together and block free blood flow through the blood vessels. As a result, the tissues and organs of the body do not receive enough oxygen. Sickle cell anemia causes organ damage and pain and increases the risk of infection.      CAUSES    Sickle cell anemia is a genetic disorder. Those who receive two copies of the gene have the condition, and those who receive one copy have the trait.    RISK FACTORS    The sickle cell gene is most common in people whose families originated in [place]. Other areas of the globe where sickle cell trait occurs include the [place], [place], the [place], and the [place].     SIGNS AND SYMPTOMS     Pain, especially in the extremities, back, chest, or abdomen (common). The pain may start suddenly or may develop following an illness, especially if there is dehydration. Pain can also occur due to overexertion or exposure to extreme temperature changes.     Frequent severe bacterial infections, especially certain types of pneumonia and meningitis.     Pain and swelling in the hands and feet.     Decreased activity. ?     Loss of appetite. ?     Change in behavior.      Headaches.     Seizures.     Shortness of breath or difficulty breathing.     Vision changes.     Skin ulcers.    Those with the trait may not have symptoms or they may have mild symptoms.     DIAGNOSIS    Sickle cell anemia is diagnosed with blood tests that demonstrate the genetic trait. It is often diagnosed during the newborn period, due to mandatory testing nationwide. A variety of blood tests, X-rays, CT  scans, MRI scans, ultrasounds, and lung function tests may also be done to monitor the condition.    TREATMENT    Sickle cell anemia may be treated with:     Medicines. You may be given pain medicines, antibiotic medicines (to treat and prevent infections) or medicines to increase the production of certain types of hemoglobin.     Fluids.     Oxygen.     Blood transfusions.    HOME CARE INSTRUCTIONS     Drink enough fluid to keep your urine clear or pale yellow. Increase your fluid intake in hot weather and during exercise.      Do not smoke. Smoking lowers oxygen levels in the blood. ?     Only take over-the-counter or prescription medicines for pain, fever, or discomfort as directed by your health care provider.     Take antibiotics as directed by your health care provider. Make sure you finish them it even if you start to feel better. ?     Take supplements as directed by your health care provider. ?     Consider wearing a medical alert bracelet. This tells anyone caring for you in an emergency of your condition. ?     When traveling,  keep your medical information, health care provider's names, and the medicines you take with you at all times. ?     If you develop a fever, do not take medicines to reduce the fever right away. This could cover up a problem that is developing. Notify your health care provider.      Keep all follow-up appointments with your health care provider. Sickle cell anemia requires regular medical care.     SEEK MEDICAL CARE IF:    ?You have a fever.    SEEK IMMEDIATE MEDICAL CARE IF:     You feel dizzy or faint. ?     You have new abdominal pain, especially on the left side near the stomach area. ?     You develop a persistent, often uncomfortable and painful penile erection (priapism). If this is not treated immediately it will lead to impotence. ?     You have numbness your arms or legs or you have a hard time moving them. ?     You have a hard time with speech. ?     You have a fever or  persistent symptoms for more than 2?3 days. ?     You have a fever and your symptoms suddenly get worse. ?     You have signs or symptoms of infection. These include: ?    ? Chills. ?    ? Abnormal tiredness (lethargy). ?    ? Irritability. ?    ? Poor eating. ?    ? Vomiting. ?     You develop pain that is not helped with medicine. ?     You develop shortness of breath.     You have pain in your chest. ?     You are coughing up pus-like or bloody sputum. ?     You develop a stiff neck.     Your feet or hands swell or have pain.     Your abdomen appears bloated.     You develop joint pain.    MAKE SURE YOU:     Understand these instructions.    This information is not intended to replace advice given to you by your health care provider. Make sure you discuss any questions you have with your health care provider.    Document Released: 01/31/2006 Document Revised: 11/13/2014 Document Reviewed: 06/04/2013  Elsevier Interactive Patient Education ?2016 [employer]

## 2019-11-09 NOTE — Discharge Summary (Signed)
 ED Clinical Summary                     [facility]  [address]  [place], [place] [address]  [phone]          PERSON INFORMATION  Name: Judith [name], Judith [name] Age:  34 Years DOB: 06-25-1986   Sex: Female Language: English PCP: PCP,  NONE   Marital Status: Single Phone: [phone] Med Service: MED-Medicine   MRN: [id number] Acct# [id number] Arrival: 11/09/2019 07:24:00   Visit Reason: Sickle cell pain; N/V/D Acuity: 3 LOS: 000 03:36   Address:    [address] [place] [facility] [address]   Diagnosis:    Sickle cell pain crisis  Medications:          Medications That Were Updated - Follow Current Instructions  Printed Prescriptions  Current promethazine (Phenergan 25 mg rectal suppository) 1 Tabs Per rectum (in the rectum) every 6 hours as needed nausea for 5 Days. Refills: 1.  Last Dose:____________________  Current promethazine (promethazine 25 mg oral tablet) 1 Tabs Oral (given by mouth) every 6 hours as needed nausea for 7 Days. Refills: 0.  Last Dose:____________________    Other Medications  Current promethazine (promethazine 25 mg oral tablet) 1 Tabs Oral (given by mouth) every 6 hours as needed as needed for nausea/vomiting.  Last Dose:____________________    Medications that have not changed  Other Medications  albuterol (Proventil HFA 90 mcg/inh inhalation aerosol) 2 Puffs Inhale (breathe in) every 4 hours as needed.  Last Dose:____________________  bisacodyl (Dulcolax Laxative 5 mg oral delayed release tablet) 1 Tabs Oral (given by mouth) every day as needed., DO NOT CRUSH  Last Dose:____________________  citalopram (CeleXA 40 mg oral tablet) 1 Tabs Oral (given by mouth) every day., SOUND ALIKE / LOOK ALIKE - VERIFY DRUG  Last Dose:____________________  clonazePAM (KlonoPIN 0.5 mg oral tablet) 1 Tabs Oral (given by mouth) Once a Day (at bedtime)., SOUND ALIKE / LOOK ALIKE - VERIFY DRUG   SOUND ALIKE / LOOK ALIKE - VERIFY DRUG  Last Dose:____________________  cyclobenzaprine  (cyclobenzaprine 5 mg oral tablet) 1 Tabs Oral (given by mouth) 3 times a day for 14 Days.,  THIS MEDICATION IS ASSOCIATED  WITH  AN INCREASED RISK OF FALLS.  Last Dose:____________________  diphenhydrAMINE (Benadryl 25 mg oral tablet) 2 Tabs Oral (given by mouth) as needed prn.  Last Dose:____________________  docusate (Colace 100 mg oral capsule) 1 Capsules Oral (given by mouth) 2 times a day as needed as needed for constipation.  Last Dose:____________________  folic acid (folic acid 1 mg oral tablet) 1 Tabs Oral (given by mouth) every day.  Last Dose:____________________  HYDROmorphone (HYDROmorphone 16 mg oral tablet, extended release) 1 Tabs Oral (given by mouth) every day.,  THIS MEDICATION IS ASSOCIATED  WITH  AN INCREASED RISK OF FALLS.  Last Dose:____________________  ibuprofen (ibuprofen 800 mg oral tablet) 1 Tabs Oral (given by mouth) 3 times a day as needed for pain.  Last Dose:____________________  mirtazapine (Remeron 15 mg oral tablet) 1 Tabs Oral (given by mouth) Once a Day (at bedtime).  Last Dose:____________________  oxyCODONE (oxyCODONE 30 mg oral tablet) 1 Tabs Oral (given by mouth) every 4 hours as needed for pain.  Last Dose:____________________  sodium bicarbonate (sodium bicarbonate 650 mg oral tablet) 0.5 Tabs Oral (given by mouth) 2 times a day.  Last Dose:____________________      Medications Administered During Visit:  Medication Dose Route   Sodium Chloride 0.9% 1000 mL IV Piggyback   hydromorphone 1 mg IV Push   promethazine 6.25 mg IV Push   ketorolac 15 mg IV Push   diphenhydrAMINE 12.5 mg IV Push   hydromorphone 1 mg IV Push   diphenhydrAMINE 12.5 mg IV Push   promethazine 12.5 mg IV Push   heparin flush 500 units Intracath               Allergies      Rocephin      fentaNYL      morphine      contrast media (iodine-based)      Bactrim (Itching)      Major Tests and Procedures:  The following procedures and tests were performed during your ED visit.  COMMON  PROCEDURES%>  COMMON PROCEDURES COMMENTS%>                PROVIDER INFORMATION               Provider Role Assigned Sampson CANDIDA LENIS [facility] ED Provider 11/09/2019 07:34:05    Clent, RN, Clotilda T ED Nurse 11/09/2019 07:53:16    Trudy, RN, [facility] ED Nurse 11/09/2019 10:06:16        Attending Physician:  COOK-MD,  DAVID THOMAS      Admit Doc  COOK-MD,  DAVID THOMAS     Consulting Doc       VITALS INFORMATION  Vital Sign Triage Latest   Temp Oral ORAL_1%> ORAL%>   Temp Temporal TEMPORAL_1%> TEMPORAL%>   Temp Intravascular INTRAVASCULAR_1%> INTRAVASCULAR%>   Temp Axillary AXILLARY_1%> AXILLARY%>   Temp Rectal RECTAL_1%> RECTAL%>   02 Sat 98 % 100 %   Respiratory Rate RATE_1%> RATE%>   Peripheral Pulse Rate PULSE RATE_1%> PULSE RATE%>   Apical Heart Rate HEART RATE_1%> HEART RATE%>   Blood Pressure BLOOD PRESSURE_1%>/ BLOOD PRESSURE_1%>88 mmHg BLOOD PRESSURE%> / BLOOD PRESSURE%>80 mmHg                 Immunizations      No Immunizations Documented This Visit          DISCHARGE INFORMATION   Discharge Disposition: H Outpt-Sent Home   Discharge Location:  Home   Discharge Date and Time:  11/09/2019 11:00:15   ED Checkout Date and Time:  11/09/2019 11:00:15     DEPART REASON INCOMPLETE INFORMATION               Depart Action Incomplete Reason   Interactive View/I&O Recently assessed               Problems      Active           Hypertension          Sickle cell              Smoking Status      Former smoker         PATIENT EDUCATION INFORMATION  Instructions:     Sickle Cell Anemia, Adult     Follow up:                   With: Address: When:   TEMEIA MARTIN, View Only Providers - No Access [address] [place], [place] [address]  [phone] Business (1) Within 1 week   Comments:   Please follow-up, as instructed. Return to the Emergency Department, as needed, for any new/worsening symptoms or further concerns.    If you  do not have a primary care physician or need assistance finding any other type of physician,  you may call [phone] for assistance.    Continue your current home medications, as already prescribed.              ED PROVIDER DOCUMENTATION     Patient:   Judith [name], Judith [name]            MRN: [id number]            FIN: [id number]               Age:   20 years     Sex:  Female     DOB:  [birth year]   Associated Diagnoses:   Sickle cell pain crisis   Author:   CANDIDA ALM NED      Basic Information   Time seen: Provider Seen (ST)   ED Provider/Time:    COOK-MD,  DAVID NED / 11/09/2019 07:34  .   Patient information:: Chief Complaint from Nursing Triage Note : Chief Complaint   11/09/2019 7:28 EST        Chief Complaint           nausea, vomiting, diarrhea and headaches. she is having a sickle cell crisis per patient. this has been going on for 4 days.  SABRA   History source: Patient.      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  She notes that the body aches are principally in the ribs, low back, and hips.  The patient takes oxycodone (30 mg every 4 to 6 hours) and hydromorphone (16 mg sustained release once daily) as well as NSAIDs for her typical pain management at home.  She denies any fever, cough, dyspnea, or any other associated symptoms.  She reports that this episode of symptoms began about 4 days ago.      Review of Systems   Constitutional symptoms:  No fever, no chills.    Skin symptoms:  No rash,    Respiratory symptoms:  No shortness of breath, no cough.    Cardiovascular symptoms:  Chest pain, bilateral chest, lateral, No palpitations,    Gastrointestinal symptoms:  Nausea, vomiting, diarrhea, No abdominal pain,    Genitourinary symptoms:  No dysuria,    Musculoskeletal symptoms:  Back pain, hip pain.    Neurologic symptoms:  Headache, no dizziness, no numbness, no tingling, no focal weakness.              Additional review of systems  information: All other systems reviewed and otherwise negative.      Health Status   Allergies:    Allergic Reactions (Selected)  Moderate  Bactrim- Itching.  Unknown  Contrast media (iodine-based)- No reactions were documented.  FentaNYL- No reactions were documented.  Morphine- No reactions were documented.  Rocephin- No reactions were documented..   Medications:  (Selected)   Inpatient Medications  Ordered  Dilaudid: 1 mg, IV Push, Once  Sodium Chloride 0.9% bolus: 1,000 mL, 2000 mL/hr, IV Piggyback, Once  Toradol: 15 mg, 1 mL, IV Push, Once  promethazine IV - High Alert: 6.25 mg, 0.25 mL, IV Push, Once  Documented Medications  Documented  Benadryl 25 mg oral tablet: 50 mg, 2 tabs, Oral, PRN: prn, 0 Refill(s)  CeleXA 40 mg oral tablet: 40 mg, 1 tabs, Oral,  Daily, 30 tabs, 0 Refill(s)  Colace 100 mg oral capsule: 100 mg, 1 caps, Oral, BID, PRN: as needed for constipation, 0 Refill(s)  Dulcolax Laxative 5 mg oral delayed release tablet: 5 mg, 1 tabs, Oral, Daily, PRN, 0 Refill(s)  HYDROmorphone 16 mg oral tablet, extended release: 16 mg, 1 tabs, Oral, Daily, 0 Refill(s)  KlonoPIN 0.5 mg oral tablet: 0.5 mg, 1 tabs, Oral, Once a Day (at bedtime), 0 Refill(s)  Proventil HFA 90 mcg/inh inhalation aerosol: 2 puffs, Inhale, q4hr, PRN, 6.7 g, 0 Refill(s)  Remeron 15 mg oral tablet: 15 mg, 1 tabs, Oral, Once a Day (at bedtime), 30 tabs, 0 Refill(s)  cyclobenzaprine 5 mg oral tablet: 5 mg, 1 tabs, Oral, TID, for 14 days, 42 tabs, 0 Refill(s)  folic acid 1 mg oral tablet: 1 mg, 1 tabs, Oral, Daily, 0 Refill(s)  ibuprofen 800 mg oral tablet: 800 mg, 1 tabs, Oral, TID, PRN: for pain, 30 tabs, 0 Refill(s)  oxyCODONE 30 mg oral tablet: 30 mg, 1 tabs, Oral, q4hr, PRN: for pain, 0 Refill(s)  promethazine 25 mg oral tablet: 25 mg, 1 tabs, Oral, q6hr, PRN: as needed for nausea/vomiting, 0 Refill(s)  sodium bicarbonate 650 mg oral tablet: 325 mg, 0.5 tabs, Oral, BID, 0 Refill(s).      Past Medical/ Family/ Social History   Medical  history: Reviewed as documented in chart.   Surgical history: Reviewed as documented in chart.   Family history: Reviewed as documented in chart.   Social history: Reviewed as documented in chart.      Physical Examination               Vital Signs   Vital Signs   11/09/2019 7:50 EST Heart Rate Monitored 107 bpm  HI    Respiratory Rate 18 br/min    SpO2 99 %   11/09/2019 7:28 EST Systolic Blood Pressure 123 mmHg    Diastolic Blood Pressure 88 mmHg    Temperature Oral 37.4 degC  HI    Heart Rate Monitored 110 bpm  HI    Respiratory Rate 20 br/min    SpO2 98 %   .               Per nurse's notes.   Measurements   11/09/2019 7:31 EST Body Mass Index est meas 22.73 kg/m2    Body Mass Index Measured 22.73 kg/m2   11/09/2019 7:28 EST Height/Length Measured 160 cm    Weight Dosing 58.2 kg   .   Basic Oxygen Information   11/09/2019 7:50 EST SpO2 99 %   11/09/2019 7:28 EST SpO2 98 %   .   General:  Alert, no acute distress, cooperative.    Skin:  Warm, dry, pink, no rash.    Head:  Normocephalic, atraumatic.    Neck:  Supple, no tenderness.    Eye:  Pupils are equal, round and reactive to light.   Ears, nose, mouth and throat:  Oral mucosa moist, airway patent.    Cardiovascular:  Regular rate and rhythm, No murmur, Normal peripheral perfusion, No edema.    Respiratory:  Respirations are non-labored, breath sounds are equal, Breath sounds: no rales present, no rhonchi present, no wheezes present.    Gastrointestinal:  Soft, Nontender.    Back:  Nontender, Normal range of motion.    Musculoskeletal:  No swelling, no deformity, Neurovascular Intact.    Neurological:  Alert and oriented to person, place, time, and situation, Neuro exam grossly normal and patient is  moving all extremities.SABRA    Psychiatric:  Cooperative, appropriate mood & affect.       Medical Decision Making   Rationale:  11/09/2019 10:48:19:   The patient is feeling better after medications and IV fluids in the ED.  Findings are consistent with a sickle cell anemia  exacerbation with associated vaso-occlusive pain crisis.  The patient is tolerating oral intake in the ED.  Plan for continued oral hydration as well as resumption of her usual outpatient analgesic regimen.  I advised her to follow-up with her hematologist (Dr. Gladis at [facility]) within the next several days for reassessment.  I have advised her to return to the ED, as needed, for any new or worsening symptoms.  .   Documents reviewed:  vital signs, Triage Nurse Note.    Results review:  Lab results : Lab View   11/09/2019 9:11 EST Estimated Creatinine Clearance 109.25 mL/min   11/09/2019 8:15 EST WBC 13.9 x10e3/mcL  HI    RBC 3.04 x10e6/mcL  LOW    RBC 3.04 x10e6/mcL  LOW    Hgb 8.3 g/dL  LOW    HCT 75.1 %  LOW    MCV 81.6 fL    MCH 27.3 pg    MCHC 33.5 g/dL    RDW 81.7 %  HI    Platelet 530 x10e3/mcL  HI    Immature Plt Absolute 9.0 x10e3/mcL  NA    Immature Plt Percent 1.7 %    MPV 9.1 fL    NRBC Absolute Auto 0.020 x10e3/mcL  HI    NRBC Percent Auto 0.1 %    Retic Percent 5.7 %  HI    Absolute Retic 0.1742 /mcL  HI    Sodium Lvl 132 mmol/L  LOW    Potassium Lvl 4.2 mmol/L    Chloride 106 mmol/L    CO2 14 mmol/L  LOW    Glucose Random 132 mg/dL  HI    BUN 10 mg/dL    Creatinine Lvl 0.6 mg/dL    AGAP 12 mmol/L    Osmolality Calc 265 mOsm/kg  LOW    Calcium Lvl 8.9 mg/dL    eGFR AA 861 fO/fpw/8.26f    eGFR Non-AA 119 mL/min/1.45m   11/09/2019 7:31 EST Estimated Creatinine Clearance 131.10 mL/min   .      Reexamination/ Reevaluation   Re-examination/Re-evaluation:   Impression and Plan   Diagnosis   Sickle cell pain crisis (ICD10-CM D57.00, Discharge, Medical)   Plan   Condition: Improved.    Disposition: Discharged: time  11/09/2019 10:51:00.    Prescriptions: Launch prescriptions   Pharmacy:  promethazine 25 mg oral tablet (Prescribe): 25 mg, 1 tabs, Oral, q6hr, for 7 days, PRN: nausea, 20 tabs, 0 Refill(s)  Phenergan 25 mg rectal suppository (Prescribe): 1 tabs, PR, q6hr, for 5 days, PRN: nausea, 6 supp, 1 Refill(s).     Patient was given the following educational materials: Sickle Cell Anemia, Adult.    Follow up with: WILHEMENA GLADIS, View Only Providers - No Access Within 1 week Please follow-up, as instructed.  Return to the Emergency Department, as needed, for any new/worsening symptoms or further concerns.     If you do not have a primary care physician or need assistance finding any other type of physician, you may call [phone] for assistance.     Continue your current home medications, as already prescribed. .    Counseled: Patient, Regarding diagnosis, Regarding diagnostic results, Regarding treatment plan, Regarding prescription, I had a detailed discussion  with the patient and/or guardian regarding the historical points/exam findings supporting the discharge diagnosis and need for outpatient followup. Discussed the need to return to the ER if symptoms persist/worsen, or for any questions/concerns that arise at home.

## 2019-11-09 NOTE — ED Notes (Signed)
ED Triage Note       ED Triage Adult Entered On:  11/09/2019 7:31 EST    Performed On:  11/09/2019 7:28 EST by Melina Copa, RN, Benjamine Mola D               Triage   Chief Complaint :   nausea, vomiting, diarrhea and headaches. she is having a sickle cell crisis per patient. this has been going on for 4 days.    Numeric Rating Pain Scale :   8   [place] Mode of Arrival :   Walking   Infectious Disease Documentation :   Document assessment   Patient received chemo or biotherapy last 48 hrs? :   No   Temperature Oral :   37.4 degC(Converted to: 99.3 degF)  (HI)    Heart Rate Monitored :   110 bpm (HI)    Respiratory Rate :   20 br/min   Systolic Blood Pressure :   413 mmHg   Diastolic Blood Pressure :   88 mmHg   SpO2 :   98 %   Patient presentation :   None of the above   Chief Complaint or Presentation suggest infection :   No   Dosing Weight Obtained By :   Patient stated   Weight Dosing :   58.2 kg(Converted to: 128 lb 5 oz)    Height :   160 cm(Converted to: 5 ft 3 in)    Body Mass Index Dosing :   23 kg/m2   BUTLER, RN, [place] D - 11/09/2019 7:28 EST   DCP GENERIC CODE   Tracking Acuity :   3   Tracking Group :   ED [place], RN, [facility] D - 11/09/2019 7:28 EST   ED General Section :   Document assessment   Pregnancy Status :   Patient denies   Approximate Last Menstrual Period :   depo   ED Allergies Section :   Document assessment   ED Reason for Visit Section :   Document assessment   ED Quick Assessment :   Patient appears awake, alert, oriented to baseline. Skin warm and dry. Moves all extremities. Respiration even and unlabored. Appears in no apparent distress.   BUTLER, RN, ELIZABETH D - 11/09/2019 7:28 EST   ID Risk Screen Symptoms   Recent Travel History :   No recent travel   Close Contact with COVID-19 ID :   No   Last 14 days COVID-19 ID :   No   BUTLER, RN, ELIZABETH D - 11/09/2019 7:28 EST   Allergies   (As Of: 11/09/2019 07:31:26 EST)   Allergies (Active)   Bactrim  Estimated Onset Date:    Unspecified ; Reactions:   Itching ; Created By:   Teressa Senter RN, Arsenio Katz; Reaction Status:   Active ; Category:   Drug ; Substance:   Bactrim ; Type:   Allergy ; Severity:   Moderate ; Updated By:   Thedora Hinders; Reviewed Date:   07/24/2019 10:36 EDT      contrast media (iodine-based)  Estimated Onset Date:   Unspecified ; Created ByOkey Dupre, RN, JENNETTE; Reaction Status:   Active ; Category:   Drug ; Substance:   contrast media (iodine-based) ; Type:   Allergy ; Severity:   Unknown ; Updated By:   Recardo Evangelist; Reviewed Date:   07/24/2019 10:36 EDT      fentaNYL  Estimated Onset Date:  Unspecified ; Created By:   Rock Nephew RN, JENNETTE; Reaction Status:   Active ; Category:   Drug ; Substance:   fentaNYL ; Type:   Allergy ; Severity:   Unknown ; Updated By:   Irene Limbo; Reviewed Date:   07/24/2019 10:36 EDT      morphine  Estimated Onset Date:   Unspecified ; Created ByRock Nephew, RN, JENNETTE; Reaction Status:   Active ; Category:   Drug ; Substance:   morphine ; Type:   Allergy ; Severity:   Unknown ; Updated By:   Irene Limbo; Reviewed Date:   07/24/2019 10:36 EDT      Rocephin  Estimated Onset Date:   Unspecified ; Created By:   Rock Nephew RN, JENNETTE; Reaction Status:   Active ; Category:   Drug ; Substance:   Rocephin ; Type:   Allergy ; Severity:   Unknown ; Updated By:   Irene Limbo; Reviewed Date:   07/24/2019 10:36 EDT        Psycho-Social   Last 3 mo, thoughts killing self/others :   Patient denies   Injuries/Abuse/Neglect in Household :   Denies   Feels Unsafe at Home :   No   Agency(s)/Others notified :   No   ED Behavioral Activity Rating Scale :   4 - Quiet and awake (normal level of activity)   BUTLER, RN, Lanora Manis D - 11/09/2019 7:28 EST   ED Reason for Visit   (As Of: 11/09/2019 07:31:26 EST)   Problems(Active)    Alteration in comfort: pain (SNOMED CT  :41324401 )  Name of Problem:   Alteration in comfort: pain ; Recorder:   SYSTEM,   SYSTEM; Confirmation:   Confirmed ; Classification:   Interdisciplinary ; Code:   [id number] ; Last Updated:   03/28/2019 2:23 EDT ; Life Cycle Date:   03/28/2019 ; Life Cycle Status:   Active ; Vocabulary:   SNOMED CT   ; Comments:        03/28/2019 2:23 - SYSTEM,  SYSTEM  Problem added automatically by system based on initiation of Alteration in Comfort Plan of Care      Hypertension (IMO  :44034 )  Name of Problem:   Hypertension ; Recorder:   Rock Nephew, RN, JENNETTE; Confirmation:   Confirmed ; Classification:   Medical ; Code:   [phone] ; Contributor System:   Dietitian ; Last Updated:   11/08/2015 5:36 EST ; Life Cycle Date:   11/08/2015 ; Life Cycle Status:   Active ; Vocabulary:   IMO        Shortness of breath (IMO  :56387 )  Name of Problem:   Shortness of breath ; Recorder:   SYSTEM,  SYSTEM; Confirmation:   Confirmed ; Classification:   Patient Stated ; Code:   56433 ; Last Updated:   04/17/2019 15:58 EDT ; Life Cycle Date:   04/17/2019 ; Life Cycle Status:   Active ; Vocabulary:   IMO        Sickle cell (SNOMED CT  :29518841 )  Name of Problem:   Sickle cell ; Recorder:   ALBAYALDE, RN, JENNETTE; Confirmation:   Confirmed ; Classification:   Medical ; Code:   [id number] ; Contributor System:   Dietitian ; Last Updated:   11/08/2015 5:36 EST ; Life Cycle Date:   11/08/2015 ; Life Cycle Status:   Active ; Vocabulary:   SNOMED CT          Diagnoses(Active)  Sickle cell pain  Date:   11/09/2019 ; Diagnosis Type:   Reason For Visit ; Confirmation:   Complaint of ; Clinical Dx:   Sickle cell pain ; Classification:   Medical ; Clinical Service:   Emergency medicine ; Code:   PNED ; Probability:   0 ; Diagnosis Code:   [id number]

## 2019-11-09 NOTE — ED Provider Notes (Signed)
General ED Note        Patient:   Judith [name], Judith [name]            MRN: [id number]            FIN: [id number]               Age:   34 years     Sex:  Female     DOB:  [birth year]   Associated Diagnoses:   Sickle cell pain crisis   Author:   Malike Foglio-MD,  [place]   Time seen: Provider Seen (ST)   ED Provider/Time:    Dev Dhondt-MD,  [place] Marcello Moores / 11/09/2019 07:34  .   Patient information:: Chief Complaint from Nursing Triage Note : Chief Complaint   11/09/2019 7:28 EST        Chief Complaint           nausea, vomiting, diarrhea and headaches. she is having a sickle cell crisis per patient. this has been going on for 4 days.  Marland Kitchen   History source: Patient.      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  She notes that the body aches are principally in the ribs, low back, and hips.  The patient takes oxycodone (30 mg every 4 to 6 hours) and hydromorphone (16 mg sustained release once daily) as well as NSAIDs for her typical pain management at home.  She denies any fever, cough, dyspnea, or any other associated symptoms.  She reports that this episode of symptoms began about 4 days ago.      Review of Systems   Constitutional symptoms:  No fever, no chills.    Skin symptoms:  No rash,    Respiratory symptoms:  No shortness of breath, no cough.    Cardiovascular symptoms:  Chest pain, bilateral chest, lateral, No palpitations,    Gastrointestinal symptoms:  Nausea, vomiting, diarrhea, No abdominal pain,    Genitourinary symptoms:  No dysuria,    Musculoskeletal symptoms:  Back pain, hip pain.    Neurologic symptoms:  Headache, no dizziness, no numbness, no tingling, no focal weakness.              Additional review of systems information: All other systems reviewed and otherwise negative.      Health Status   Allergies:    Allergic Reactions  (Selected)  Moderate  Bactrim- Itching.  Unknown  Contrast media (iodine-based)- No reactions were documented.  FentaNYL- No reactions were documented.  Morphine- No reactions were documented.  Rocephin- No reactions were documented..   Medications:  (Selected)   Inpatient Medications  Ordered  Dilaudid: 1 mg, IV Push, Once  Sodium Chloride 0.9% bolus: 1,000 mL, 2000 mL/hr, IV Piggyback, Once  Toradol: 15 mg, 1 mL, IV Push, Once  promethazine IV - High Alert: 6.25 mg, 0.25 mL, IV Push, Once  Documented Medications  Documented  Benadryl 25 mg oral tablet: 50 mg, 2 tabs, Oral, PRN: prn, 0 Refill(s)  CeleXA 40 mg oral tablet: 40 mg, 1 tabs, Oral, Daily, 30 tabs, 0 Refill(s)  Colace 100 mg oral capsule: 100 mg, 1 caps, Oral, BID, PRN: as needed for constipation, 0 Refill(s)  Dulcolax Laxative 5 mg oral delayed release tablet: 5 mg, 1 tabs, Oral, Daily, PRN, 0 Refill(s)  HYDROmorphone  16 mg oral tablet, extended release: 16 mg, 1 tabs, Oral, Daily, 0 Refill(s)  KlonoPIN 0.5 mg oral tablet: 0.5 mg, 1 tabs, Oral, Once a Day (at bedtime), 0 Refill(s)  Proventil HFA 90 mcg/inh inhalation aerosol: 2 puffs, Inhale, q4hr, PRN, 6.7 g, 0 Refill(s)  Remeron 15 mg oral tablet: 15 mg, 1 tabs, Oral, Once a Day (at bedtime), 30 tabs, 0 Refill(s)  cyclobenzaprine 5 mg oral tablet: 5 mg, 1 tabs, Oral, TID, for 14 days, 42 tabs, 0 Refill(s)  folic acid 1 mg oral tablet: 1 mg, 1 tabs, Oral, Daily, 0 Refill(s)  ibuprofen 800 mg oral tablet: 800 mg, 1 tabs, Oral, TID, PRN: for pain, 30 tabs, 0 Refill(s)  oxyCODONE 30 mg oral tablet: 30 mg, 1 tabs, Oral, q4hr, PRN: for pain, 0 Refill(s)  promethazine 25 mg oral tablet: 25 mg, 1 tabs, Oral, q6hr, PRN: as needed for nausea/vomiting, 0 Refill(s)  sodium bicarbonate 650 mg oral tablet: 325 mg, 0.5 tabs, Oral, BID, 0 Refill(s).      Past Medical/ Family/ Social History   Medical history: Reviewed as documented in chart.   Surgical history: Reviewed as documented in chart.   Family history:  Reviewed as documented in chart.   Social history: Reviewed as documented in chart.      Physical Examination               Vital Signs   Vital Signs   11/09/2019 7:50 EST Heart Rate Monitored 107 bpm  HI    Respiratory Rate 18 br/min    SpO2 99 %   11/09/863 7:84 EST Systolic Blood Pressure 696 mmHg    Diastolic Blood Pressure 88 mmHg    Temperature Oral 37.4 degC  HI    Heart Rate Monitored 110 bpm  HI    Respiratory Rate 20 br/min    SpO2 98 %   .               Per nurse's notes.   Measurements   11/09/2019 7:31 EST Body Mass Index est meas 22.73 kg/m2    Body Mass Index Measured 22.73 kg/m2   11/09/2019 7:28 EST Height/Length Measured 160 cm    Weight Dosing 58.2 kg   .   Basic Oxygen Information   11/09/2019 7:50 EST SpO2 99 %   11/09/2019 7:28 EST SpO2 98 %   .   General:  Alert, no acute distress, cooperative.    Skin:  Warm, dry, pink, no rash.    Head:  Normocephalic, atraumatic.    Neck:  Supple, no tenderness.    Eye:  Pupils are equal, round and reactive to light.   Ears, nose, mouth and throat:  Oral mucosa moist, airway patent.    Cardiovascular:  Regular rate and rhythm, No murmur, Normal peripheral perfusion, No edema.    Respiratory:  Respirations are non-labored, breath sounds are equal, Breath sounds: no rales present, no rhonchi present, no wheezes present.    Gastrointestinal:  Soft, Nontender.    Back:  Nontender, Normal range of motion.    Musculoskeletal:  No swelling, no deformity, Neurovascular Intact.    Neurological:  Alert and oriented to person, place, time, and situation, Neuro exam grossly normal and patient is moving all extremities.Marland Kitchen    Psychiatric:  Cooperative, appropriate mood & affect.       Medical Decision Making   Rationale:  11/09/2019 10:48:19:   The patient is feeling better after medications and IV fluids in the ED.  Findings are consistent with a sickle cell anemia exacerbation with associated vaso-occlusive pain crisis.  The patient is tolerating oral intake in the ED.  Plan for  continued oral hydration as well as resumption of her usual outpatient analgesic regimen.  I advised her to follow-up with her hematologist (Dr. Hassell Done at [facility]) within the next several days for reassessment.  I have advised her to return to the ED, as needed, for any new or worsening symptoms.  .   Documents reviewed:  vital signs, Triage Nurse Note.    Results review:  Lab results : Lab View   11/09/2019 9:11 EST Estimated Creatinine Clearance 109.25 mL/min   11/09/2019 8:15 EST WBC 13.9 x10e3/mcL  HI    RBC 3.04 x10e6/mcL  LOW    RBC 3.04 x10e6/mcL  LOW    Hgb 8.3 g/dL  LOW    HCT 24.8 %  LOW    MCV 81.6 fL    MCH 27.3 pg    MCHC 33.5 g/dL    RDW 18.2 %  HI    Platelet 530 x10e3/mcL  HI    Immature Plt Absolute 9.0 x10e3/mcL  NA    Immature Plt Percent 1.7 %    MPV 9.1 fL    NRBC Absolute Auto 0.020 x10e3/mcL  HI    NRBC Percent Auto 0.1 %    Retic Percent 5.7 %  HI    Absolute Retic 0.1742 /mcL  HI    Sodium Lvl 132 mmol/L  LOW    Potassium Lvl 4.2 mmol/L    Chloride 106 mmol/L    CO2 14 mmol/L  LOW    Glucose Random 132 mg/dL  HI    BUN 10 mg/dL    Creatinine Lvl 0.6 mg/dL    AGAP 12 mmol/L    Osmolality Calc 265 mOsm/kg  LOW    Calcium Lvl 8.9 mg/dL    eGFR AA 138 mL/min/1.55m???    eGFR Non-AA 119 mL/min/1.759m??   11/09/2019 7:31 EST Estimated Creatinine Clearance 131.10 mL/min   .      Reexamination/ Reevaluation   Re-examination/Re-evaluation:   Impression and Plan   Diagnosis   Sickle cell pain crisis (ICD10-CM D57.00, Discharge, Medical)   Plan   Condition: Improved.    Disposition: Discharged: time  11/09/2019 10:51:00.    Prescriptions: Launch prescriptions   Pharmacy:  promethazine 25 mg oral tablet (Prescribe): 25 mg, 1 tabs, Oral, q6hr, for 7 days, PRN: nausea, 20 tabs, 0 Refill(s)  Phenergan 25 mg rectal suppository (Prescribe): 1 tabs, PR, q6hr, for 5 days, PRN: nausea, 6 supp, 1 Refill(s).    Patient was given the following educational materials: Sickle Cell Anemia, Adult.    Follow up with: TERonnald Ramp View Only Providers - No Access Within 1 week Please follow-up, as instructed.  Return to the Emergency Department, as needed, for any new/worsening symptoms or further concerns.     If you do not have a primary care physician or need assistance finding any other type of physician, you may call [phone] for assistance.     Continue your current home medications, as already prescribed. .    Counseled: Patient, Regarding diagnosis, Regarding diagnostic results, Regarding treatment plan, Regarding prescription, I had a detailed discussion with the patient and/or guardian regarding the historical points/exam findings supporting the discharge diagnosis and need for outpatient followup. Discussed the need to return to the ER if symptoms persist/worsen, or for any questions/concerns that arise at home.    Signature [employer]  Electronically Signed on 11/09/2019 10:53 AM EST   ________________________________________________   Jacobi Nile-MD,  Bray Vickerman THOMAS               Modified by: Bobette Mo on 11/09/2019 10:53 AM EST

## 2019-11-09 NOTE — ED Notes (Signed)
 ED Patient Summary       ;       [facility] Emergency Department  [address], [place] [address]  [phone]  Discharge Instructions (Patient)  _______________________________________     Name: Judith [name], Judith [name]  DOB: Mar 10, 1986                   MRN: [id number]                   FIN: [phone]  Reason For Visit: Sickle cell pain; N/V/D  Final Diagnosis: Sickle cell pain crisis     Visit Date: 11/09/2019 07:24:00  Address: [address] [place] [address]  Phone: [phone]     Emergency Department Providers:         Primary Physician:   CANDIDA ALM NED         [facility] would like to thank you for allowing us  to assist you with your healthcare needs. The following includes patient education materials and information regarding your injury/illness.     Follow-up Instructions:  You were seen today on an emergency basis. Please contact your primary care doctor for a follow up appointment. If you received a referral to a specialist doctor, it is important you follow-up as instructed.    It is important that you call your follow-up doctor to schedule and confirm the location of your next appointment. Your doctor may practice at multiple locations. The office location of your follow-up appointment may be different to the one written on your discharge instructions.    If you do not have a primary care doctor, please call [phone] for help in finding a [name]. [facility] Provider. For help in finding a specialist doctor, please call [phone].    The [facility] "Ask a Nurse" line in staffed by Registered Nurses and is a free service to the community. We are available Monday - Friday from 8am to 5pm to answer your questions about your health. Please call [phone].    If your condition gets worse before your follow-up with your primary care doctor or specialist, please return to the Emergency Department.        Follow Up  Appointments:  Primary Care Provider:      Name: PCP,  NONE      Phone:                  With: Address: When:   [name], View Only Providers - No Access [address] [place], [place] [address]  [phone] Business (1) Within 1 week   Comments:   Please follow-up, as instructed. Return to the Emergency Department, as needed, for any new/worsening symptoms or further concerns.    If you do not have a primary care physician or need assistance finding any other type of physician, you may call [phone] for assistance.    Continue your current home medications, as already prescribed.              Printed Prescriptions:    Patient Education Materials:  Discharge Orders          Discharge Patient 11/09/19 10:53:00 EST         Comment:      Sickle Cell Anemia, Adult     Sickle Cell Anemia, Adult    Sickle cell anemia is a condition in which red blood cells have an abnormal sickle shape. This abnormal  shape shortens the cells' life span, which results in a lower than normal concentration of red blood cells in the blood. The sickle shape also causes the cells to clump together and block free blood flow through the blood vessels. As a result, the tissues and organs of the body do not receive enough oxygen. Sickle cell anemia causes organ damage and pain and increases the risk of infection.      CAUSES    Sickle cell anemia is a genetic disorder. Those who receive two copies of the gene have the condition, and those who receive one copy have the trait.    RISK FACTORS    The sickle cell gene is most common in people whose families originated in [place]. Other areas of the globe where sickle cell trait occurs include the [place], [place], the [place], and the [place].     SIGNS AND SYMPTOMS     Pain, especially in the extremities, back, chest, or abdomen (common). The pain may start suddenly or may develop following an illness, especially if there is dehydration. Pain can also occur due  to overexertion or exposure to extreme temperature changes.     Frequent severe bacterial infections, especially certain types of pneumonia and meningitis.     Pain and swelling in the hands and feet.     Decreased activity. ?     Loss of appetite. ?     Change in behavior.      Headaches.     Seizures.     Shortness of breath or difficulty breathing.     Vision changes.     Skin ulcers.    Those with the trait may not have symptoms or they may have mild symptoms.     DIAGNOSIS    Sickle cell anemia is diagnosed with blood tests that demonstrate the genetic trait. It is often diagnosed during the newborn period, due to mandatory testing nationwide. A variety of blood tests, X-rays, CT scans, MRI scans, ultrasounds, and lung function tests may also be done to monitor the condition.    TREATMENT    Sickle cell anemia may be treated with:     Medicines. You may be given pain medicines, antibiotic medicines (to treat and prevent infections) or medicines to increase the production of certain types of hemoglobin.     Fluids.     Oxygen.     Blood transfusions.    HOME CARE INSTRUCTIONS     Drink enough fluid to keep your urine clear or pale yellow. Increase your fluid intake in hot weather and during exercise.      Do not smoke. Smoking lowers oxygen levels in the blood. ?     Only take over-the-counter or prescription medicines for pain, fever, or discomfort as directed by your health care provider.     Take antibiotics as directed by your health care provider. Make sure you finish them it even if you start to feel better. ?     Take supplements as directed by your health care provider. ?     Consider wearing a medical alert bracelet. This tells anyone caring for you in an emergency of your condition. ?     When traveling, keep your medical information, health care provider's names, and the medicines you take with you at all times. ?     If you develop a fever, do not take medicines to reduce the fever right away. This  could cover up a problem  that is developing. Notify your health care provider.      Keep all follow-up appointments with your health care provider. Sickle cell anemia requires regular medical care.     SEEK MEDICAL CARE IF:    ?You have a fever.    SEEK IMMEDIATE MEDICAL CARE IF:     You feel dizzy or faint. ?     You have new abdominal pain, especially on the left side near the stomach area. ?     You develop a persistent, often uncomfortable and painful penile erection (priapism). If this is not treated immediately it will lead to impotence. ?     You have numbness your arms or legs or you have a hard time moving them. ?     You have a hard time with speech. ?     You have a fever or persistent symptoms for more than 2?3 days. ?     You have a fever and your symptoms suddenly get worse. ?     You have signs or symptoms of infection. These include: ?    ? Chills. ?    ? Abnormal tiredness (lethargy). ?    ? Irritability. ?    ? Poor eating. ?    ? Vomiting. ?     You develop pain that is not helped with medicine. ?     You develop shortness of breath.     You have pain in your chest. ?     You are coughing up pus-like or bloody sputum. ?     You develop a stiff neck.     Your feet or hands swell or have pain.     Your abdomen appears bloated.     You develop joint pain.    MAKE SURE YOU:     Understand these instructions.    This information is not intended to replace advice given to you by your health care provider. Make sure you discuss any questions you have with your health care provider.    Document Released: 01/31/2006 Document Revised: 11/13/2014 Document Reviewed: 06/04/2013  Elsevier Interactive Patient Education ?2016 Elsevier Inc.         Allergy Info: fentaNYL; contrast media (iodine-based); Rocephin; Bactrim; morphine     Medication Information:  [facility] ED Physicians provided you with a complete list of medications post discharge, if you have been instructed to stop taking a medication  please ensure you also follow up with this information to your Primary Care Physician.  Unless otherwise noted, patient will continue to take medications as prescribed prior to the Emergency Room visit.  Any specific questions regarding your chronic medications and dosages should be discussed with your physician(s) and pharmacist.          albuterol (Proventil HFA 90 mcg/inh inhalation aerosol) 2 Puffs Inhale (breathe in) every 4 hours as needed.  bisacodyl (Dulcolax Laxative 5 mg oral delayed release tablet) 1 Tabs Oral (given by mouth) every day as needed., DO NOT CRUSH  citalopram (CeleXA 40 mg oral tablet) 1 Tabs Oral (given by mouth) every day., SOUND ALIKE / LOOK ALIKE - VERIFY DRUG  clonazePAM (KlonoPIN 0.5 mg oral tablet) 1 Tabs Oral (given by mouth) Once a Day (at bedtime)., SOUND ALIKE / LOOK ALIKE - VERIFY DRUG   SOUND ALIKE / LOOK ALIKE - VERIFY DRUG  cyclobenzaprine (cyclobenzaprine 5 mg oral tablet) 1 Tabs Oral (given by mouth) 3 times a day for 14 Days.,  THIS MEDICATION IS  ASSOCIATED  WITH  AN INCREASED RISK OF FALLS.  diphenhydrAMINE (Benadryl 25 mg oral tablet) 2 Tabs Oral (given by mouth) as needed prn.  docusate (Colace 100 mg oral capsule) 1 Capsules Oral (given by mouth) 2 times a day as needed as needed for constipation.  folic acid (folic acid 1 mg oral tablet) 1 Tabs Oral (given by mouth) every day.  HYDROmorphone (HYDROmorphone 16 mg oral tablet, extended release) 1 Tabs Oral (given by mouth) every day.,  THIS MEDICATION IS ASSOCIATED  WITH  AN INCREASED RISK OF FALLS.  ibuprofen (ibuprofen 800 mg oral tablet) 1 Tabs Oral (given by mouth) 3 times a day as needed for pain.  mirtazapine (Remeron 15 mg oral tablet) 1 Tabs Oral (given by mouth) Once a Day (at bedtime).  oxyCODONE (oxyCODONE 30 mg oral tablet) 1 Tabs Oral (given by mouth) every 4 hours as needed for pain.  promethazine (Phenergan 25 mg rectal suppository) 1 Tabs Per rectum (in the rectum) every 6 hours as needed nausea for 5  Days. Refills: 1.  promethazine (promethazine 25 mg oral tablet) 1 Tabs Oral (given by mouth) every 6 hours as needed as needed for nausea/vomiting.  promethazine (promethazine 25 mg oral tablet) 1 Tabs Oral (given by mouth) every 6 hours as needed nausea for 7 Days. Refills: 0.  sodium bicarbonate (sodium bicarbonate 650 mg oral tablet) 0.5 Tabs Oral (given by mouth) 2 times a day.      Medications Administered During Visit:              Medication Dose Route   Sodium Chloride 0.9% 1000 mL IV Piggyback   hydromorphone 1 mg IV Push   promethazine 6.25 mg IV Push   ketorolac 15 mg IV Push   diphenhydrAMINE 12.5 mg IV Push   hydromorphone 1 mg IV Push   diphenhydrAMINE 12.5 mg IV Push   promethazine 12.5 mg IV Push   heparin flush 500 units Intracath          Major Tests and Procedures:  The following procedures and tests were performed during your ED visit.  COMMON PROCEDURES%>  COMMON PROCEDURES COMMENTS%>          Laboratory Orders  Name Status Details   .IPF Completed Blood, Stat, ST - Stat, 11/09/19 8:10:00 EST, 11/09/19 8:10:00 EST, Nurse collect, 11/09/19 8:15:00 US Robinette EMERY,  DAVID [place], [phone]   BMP Completed Blood, Stat, ST - Stat, 11/09/19 8:10:00 EST, 11/09/19 8:10:00 EST, Nurse collect, COOK-MD,  DAVID THOMAS, Print label Y/N   CBC Completed Blood, Stat, ST - Stat, 11/09/19 8:10:00 EST, 11/09/19 8:10:00 EST, Nurse collect, COOK-MD,  DAVID THOMAS, Print label Y/N   Retic Auto Completed Blood, Stat, ST - Stat, 11/09/19 8:10:00 EST, 11/09/19 8:10:00 EST, Nurse collect, COOK-MD,  DAVID THOMAS, Print label Y/N   Irwin Carbon Completed Blood, Stat, ST - Stat, Collected, 11/09/19 8:15:00 EST [id number], 11/09/19 8:15:00 EST, Nurse collect, Venous Draw, 11/09/19 8:41:00 EST, SF Phleb Login, COOK-MD,  DAVID THOMAS, Print label Y/N, sf_lab_accession_3, sf_lab_accession_3, 1.8 mL Aolz/*[id number]...   XTube SST Completed Blood, Stat, ST - Stat, Collected, 11/09/19 8:15:00 EST [id number], 11/09/19 8:15:00 EST,  Nurse collect, Venous Draw, 11/09/19 8:42:00 EST, SF Phleb Login, COOK-MD,  DAVID THOMAS, Print label Y/N, sf_lab_accession_3, sf_lab_accession_3, 4 mL DDU/*[id number]...               Radiology Orders  No radiology orders were placed.              Patient Care Orders  Name Status Details   Discharge Patient Ordered 11/09/19 10:53:00 EST   ED Assessment Adult Completed 11/09/19 7:31:26 EST, 11/09/19 7:31:26 EST   ED Secondary Triage Completed 11/09/19 7:31:26 EST, 11/09/19 7:31:26 EST   ED Triage Adult Completed 11/09/19 7:25:03 EST, 11/09/19 7:25:03 EST       ---------------------------------------------------------------------------------------------------------------------  [facility]) encourages you to self-enroll in the [facility] Patient Portal.  [facility] Patient Portal will allow you to manage your personal health information securely from your own electronic device now and in the future.  To begin your Patient Portal enrollment process, please visit [email or web address] Click on "Sign up now" under [facility].  If you find that you need additional assistance on the [facility] Patient Portal or need a copy of your medical records, please call the [facility] Medical Records Office at [phone].  Comment:

## 2019-11-09 NOTE — ED Notes (Signed)
 ED Triage Note       ED Secondary Triage Entered On:  11/09/2019 7:49 EST    Performed On:  11/09/2019 7:47 EST by Clent, RN, Clotilda DASEN               General Information   Barriers to Learning :   None evident   ED Home Meds Section :   Document assessment   [facility] ED Fall Risk Section :   Document assessment   ED Advance Directives Section :   Document assessment   ED Palliative Screen :   N/A (prefilled for <65yo)   Clent RN, Clotilda T - 11/09/2019 7:47 EST   (As Of: 11/09/2019 07:49:57 EST)   Problems(Active)    Alteration in comfort: pain (SNOMED CT  :65759985 )  Name of Problem:   Alteration in comfort: pain ; Recorder:   SYSTEM,  SYSTEM; Confirmation:   Confirmed ; Classification:   Interdisciplinary ; Code:   [id number] ; Last Updated:   03/28/2019 2:23 EDT ; Life Cycle Date:   03/28/2019 ; Life Cycle Status:   Active ; Vocabulary:   SNOMED CT   ; Comments:        03/28/2019 2:23 - SYSTEM,  SYSTEM  Problem added automatically by system based on initiation of Alteration in Comfort Plan of Care      Hypertension (IMO  :13508 )  Name of Problem:   Hypertension ; Recorder:   CHARLIES, RN, JENNETTE; Confirmation:   Confirmed ; Classification:   Medical ; Code:   [phone] ; Contributor System:   PowerChart ; Last Updated:   11/08/2015 5:36 EST ; Life Cycle Date:   11/08/2015 ; Life Cycle Status:   Active ; Vocabulary:   IMO        Shortness of breath (IMO  :72576 )  Name of Problem:   Shortness of breath ; Recorder:   SYSTEM,  SYSTEM; Confirmation:   Confirmed ; Classification:   Patient Stated ; Code:   72576 ; Last Updated:   04/17/2019 15:58 EDT ; Life Cycle Date:   04/17/2019 ; Life Cycle Status:   Active ; Vocabulary:   IMO        Sickle cell (SNOMED CT  :16827988 )  Name of Problem:   Sickle cell ; Recorder:   ALBAYALDE, RN, JENNETTE; Confirmation:   Confirmed ; Classification:   Medical ; Code:   [id number] ; Contributor System:   PowerChart ; Last Updated:   11/08/2015 5:36 EST ; Life Cycle Date:   11/08/2015 ; Life Cycle  Status:   Active ; Vocabulary:   SNOMED CT          Diagnoses(Active)    Sickle cell pain  Date:   11/09/2019 ; Diagnosis Type:   Reason For Visit ; Confirmation:   Complaint of ; Clinical Dx:   Sickle cell pain ; Classification:   Medical ; Clinical Service:   Emergency medicine ; Code:   PNED ; Probability:   0 ; Diagnosis Code:   [id number]             -    Procedure History   (As Of: 11/09/2019 07:49:57 EST)     Anesthesia Minutes:   0 ; Procedure Name:   Port ; Procedure Minutes:   0            Anesthesia Minutes:   0 ; Procedure Name:   thoracotomy ; Procedure Minutes:   0  Anesthesia Minutes:   0 ; Procedure Name:   hernia repair ; Procedure Minutes:   0            Anesthesia Minutes:   0 ; Procedure Name:   splenectomy ; Procedure Minutes:   0            Anesthesia Minutes:   0 ; Procedure Name:   Cholecystectomy; ; Procedure Minutes:   0            [facility] Fall Risk Assessment Tool   Hx of falling last 3 months ED Fall :   No   Patient intoxicated or sedated ED Fall :   No   Patient impaired gait ED Fall :   No   Use a mobility assistance device ED Fall :   No   Patient altered elimination ED Fall :   No   Clent RN, Clotilda T - 11/09/2019 7:47 EST   ED Advance Directive   Advance Directive :   No   Cramer, RN, Clotilda T - 11/09/2019 7:47 EST   Med Hx   Medication List   (As Of: 11/09/2019 07:49:57 EST)   Prescription/Discharge Order    azithromycin  :   azithromycin ; Status:   Completed ; Ordered As Mnemonic:   Zithromax Z-Pak 250 mg oral tablet ; Simple Display Line:   See Instructions, as directed on package labeling, 1 packets, 0 Refill(s) ; Ordering Provider:   VASSIE HUMMER; Catalog Code:   azithromycin ; Order Dt/Tm:   09/25/2019 22:56:40 EST            Home Meds    HYDROmorphone  :   HYDROmorphone ; Status:   Documented ; Ordered As Mnemonic:   HYDROmorphone 16 mg oral tablet, extended release ; Simple Display Line:   16 mg, 1 tabs, Oral, Daily, 0 Refill(s) ;  Catalog Code:   HYDROmorphone ; Order Dt/Tm:   11/09/2019 07:49:35 EST ; Comment:    THIS MEDICATION IS ASSOCIATED   WITH   AN INCREASED RISK OF FALLS.          sodium bicarbonate  :   sodium bicarbonate ; Status:   Documented ; Ordered As Mnemonic:   sodium bicarbonate 650 mg oral tablet ; Simple Display Line:   325 mg, 0.5 tabs, Oral, BID, 0 Refill(s) ; Ordering Provider:   TAMALA HINDERS; Catalog Code:   sodium bicarbonate ; Order Dt/Tm:   04/18/2019 14:27:46 EDT          cyclobenzaprine  :   cyclobenzaprine ; Status:   Documented ; Ordered As Mnemonic:   cyclobenzaprine 5 mg oral tablet ; Simple Display Line:   5 mg, 1 tabs, Oral, TID, for 14 days, 42 tabs, 0 Refill(s) ; Catalog Code:   cyclobenzaprine ; Order Dt/Tm:   04/17/2019 19:33:49 EDT ; Comment:    THIS MEDICATION IS ASSOCIATED   WITH   AN INCREASED RISK OF FALLS.          albuterol  :   albuterol ; Status:   Documented ; Ordered As Mnemonic:   Proventil HFA 90 mcg/inh inhalation aerosol ; Simple Display Line:   2 puffs, Inhale, q4hr, PRN, 6.7 g, 0 Refill(s) ; Catalog Code:   albuterol ; Order Dt/Tm:   03/28/2019 09:31:25 EDT          bisacodyl  :   bisacodyl ; Status:   Documented ; Ordered As Mnemonic:   Dulcolax Laxative 5 mg oral  delayed release tablet ; Simple Display Line:   5 mg, 1 tabs, Oral, Daily, PRN, 0 Refill(s) ; Catalog Code:   bisacodyl ; Order Dt/Tm:   03/28/2019 09:31:31 EDT ; Comment:   DO NOT CRUSH          citalopram  :   citalopram ; Status:   Documented ; Ordered As Mnemonic:   CeleXA 40 mg oral tablet ; Simple Display Line:   40 mg, 1 tabs, Oral, Daily, 30 tabs, 0 Refill(s) ; Catalog Code:   citalopram ; Order Dt/Tm:   03/28/2019 09:26:41 EDT ; Comment:   SOUND ALIKE / LOOK ALIKE - VERIFY DRUG          clonazePAM  :   clonazePAM ; Status:   Documented ; Ordered As Mnemonic:   KlonoPIN 0.5 mg oral tablet ; Simple Display Line:   0.5 mg, 1 tabs, Oral, Once a Day (at bedtime), 0 Refill(s) ; Catalog Code:   clonazePAM ; Order Dt/Tm:    03/28/2019 09:26:46 EDT ; Comment:   SOUND ALIKE / LOOK ALIKE - VERIFY DRUG  SOUND ALIKE / LOOK ALIKE - VERIFY DRUG          diphenhydrAMINE  :   diphenhydrAMINE ; Status:   Documented ; Ordered As Mnemonic:   Benadryl 25 mg oral tablet ; Simple Display Line:   50 mg, 2 tabs, Oral, PRN: prn, 0 Refill(s) ; Catalog Code:   diphenhydrAMINE ; Order Dt/Tm:   03/28/2019 09:31:07 EDT          docusate  :   docusate ; Status:   Documented ; Ordered As Mnemonic:   Colace 100 mg oral capsule ; Simple Display Line:   100 mg, 1 caps, Oral, BID, PRN: as needed for constipation, 0 Refill(s) ; Catalog Code:   docusate ; Order Dt/Tm:   03/28/2019 09:31:40 EDT          fentaNYL  :   fentaNYL ; Status:   Completed ; Ordered As Mnemonic:   fentaNYL 50 mcg/hr transdermal film, extended release ; Simple Display Line:   1 patches, TD, q72hr, 0 Refill(s) ; Catalog Code:   fentaNYL ; Order Dt/Tm:   03/28/2019 09:27:02 EDT          folic acid  :   folic acid ; Status:   Documented ; Ordered As Mnemonic:   folic acid 1 mg oral tablet ; Simple Display Line:   1 mg, 1 tabs, Oral, Daily, 0 Refill(s) ; Catalog Code:   folic acid ; Order Dt/Tm:   03/28/2019 90:71:74 EDT          ibuprofen  :   ibuprofen ; Status:   Documented ; Ordered As Mnemonic:   ibuprofen 800 mg oral tablet ; Simple Display Line:   800 mg, 1 tabs, Oral, TID, PRN: for pain, 30 tabs, 0 Refill(s) ; Catalog Code:   ibuprofen ; Order Dt/Tm:   03/28/2019 09:28:46 EDT          mirtazapine  :   mirtazapine ; Status:   Documented ; Ordered As Mnemonic:   Remeron 15 mg oral tablet ; Simple Display Line:   15 mg, 1 tabs, Oral, Once a Day (at bedtime), 30 tabs, 0 Refill(s) ; Catalog Code:   mirtazapine ; Order Dt/Tm:   03/28/2019 90:71:47 EDT          oxyCODONE  :   oxyCODONE ; Status:   Documented ; Ordered As Mnemonic:   oxyCODONE 30  mg oral tablet ; Simple Display Line:   30 mg, 1 tabs, Oral, q4hr, PRN: for pain, 0 Refill(s) ; Catalog Code:   oxyCODONE ; Order Dt/Tm:   03/28/2019 09:29:01  EDT          promethazine  :   promethazine ; Status:   Documented ; Ordered As Mnemonic:   promethazine 25 mg oral tablet ; Simple Display Line:   25 mg, 1 tabs, Oral, q6hr, PRN: as needed for nausea/vomiting, 0 Refill(s) ; Catalog Code:   promethazine ; Order Dt/Tm:   03/28/2019 09:32:47 EDT

## 2019-12-26 NOTE — ED Notes (Signed)
ED Triage Note       ED Secondary Triage Entered On:  12/26/2019 20:54 EST    Performed On:  12/26/2019 20:53 EST by Donnal Debar, RN, SCOTT A               General Information   Barriers to Learning :   None evident   ED Home Meds Section :   Document assessment   [facility] ED Fall Risk Section :   Document assessment   ED Advance Directives Section :   Document assessment   ED Palliative Screen :   N/A (prefilled for <35yo)   BAYS, RN, SCOTT A - 12/26/2019 20:53 EST   (As Of: 12/26/2019 20:54:04 EST)   Problems(Active)    Alteration in comfort: pain (SNOMED CT  :22979892 )  Name of Problem:   Alteration in comfort: pain ; Recorder:   SYSTEM,  SYSTEM; Confirmation:   Confirmed ; Classification:   Interdisciplinary ; Code:   [id number] ; Last Updated:   03/28/2019 2:23 EDT ; Life Cycle Date:   03/28/2019 ; Life Cycle Status:   Active ; Vocabulary:   SNOMED CT   ; Comments:        03/28/2019 2:23 - SYSTEM,  SYSTEM  Problem added automatically by system based on initiation of Alteration in Comfort Plan of Care      Hypertension (IMO  :81448 )  Name of Problem:   Hypertension ; Recorder:   Rock Nephew, RN, JENNETTE; Confirmation:   Confirmed ; Classification:   Medical ; Code:   [phone] ; Contributor System:   Dietitian ; Last Updated:   11/08/2015 5:36 EST ; Life Cycle Date:   11/08/2015 ; Life Cycle Status:   Active ; Vocabulary:   IMO        Shortness of breath (IMO  :14970 )  Name of Problem:   Shortness of breath ; Recorder:   SYSTEM,  SYSTEM; Confirmation:   Confirmed ; Classification:   Patient Stated ; Code:   26378 ; Last Updated:   04/17/2019 15:58 EDT ; Life Cycle Date:   04/17/2019 ; Life Cycle Status:   Active ; Vocabulary:   IMO        Sickle cell (SNOMED CT  :58850277 )  Name of Problem:   Sickle cell ; Recorder:   ALBAYALDE, RN, JENNETTE; Confirmation:   Confirmed ; Classification:   Medical ; Code:   [id number] ; Contributor System:   Dietitian ; Last Updated:   11/08/2015 5:36 EST ; Life Cycle Date:   11/08/2015 ; Life Cycle Status:    Active ; Vocabulary:   SNOMED CT          Diagnoses(Active)    Sickle cell pain  Date:   12/26/2019 ; Diagnosis Type:   Reason For Visit ; Confirmation:   Complaint of ; Clinical Dx:   Sickle cell pain ; Classification:   Medical ; Clinical Service:   Emergency medicine ; Code:   PNED ; Probability:   0 ; Diagnosis Code:   [id number]             -    Procedure History   (As Of: 12/26/2019 20:54:04 EST)     Anesthesia Minutes:   0 ; Procedure Name:   Port ; Procedure Minutes:   0            Anesthesia Minutes:   0 ; Procedure Name:   thoracotomy ; Procedure Minutes:   0  Anesthesia Minutes:   0 ; Procedure Name:   hernia repair ; Procedure Minutes:   0            Anesthesia Minutes:   0 ; Procedure Name:   splenectomy ; Procedure Minutes:   0            Anesthesia Minutes:   0 ; Procedure Name:   Cholecystectomy; ; Procedure Minutes:   0            [facility] Fall Risk Assessment Tool   Hx of falling last 3 months ED Fall :   No   Patient confused or disoriented ED Fall :   No   Patient intoxicated or sedated ED Fall :   No   Patient impaired gait ED Fall :   No   Use a mobility assistance device ED Fall :   No   Patient altered elimination ED Fall :   No   [facility] ED Fall Score :   0    BAYS, RN, SCOTT A - 12/26/2019 20:53 EST   ED Advance Directive   Advance Directive :   No   BAYS, RN, SCOTT A - 12/26/2019 20:53 EST

## 2019-12-26 NOTE — ED Notes (Signed)
 ED Triage Note       ED Triage Adult Entered On:  12/26/2019 20:53 EST    Performed On:  12/26/2019 20:50 EST by BAYS, RN, SCOTT A               Triage   Chief Complaint :   Pt states sickle cell pain and n/v since yesterday.  Diarrhea as well.   Numeric Rating Pain Scale :   10 = Worst possible pain   [place] Mode of Arrival :   Walking   Infectious Disease Documentation :   Document assessment   Temperature Oral :   37.1 degC(Converted to: 98.8 degF)    Heart Rate Monitored :   110 bpm (HI)    Respiratory Rate :   22 br/min (HI)    Systolic Blood Pressure :   145 mmHg (HI)    Diastolic Blood Pressure :   76 mmHg   SpO2 :   99 %   Oxygen Therapy :   Room air   Patient presentation :   None of the above   Chief Complaint or Presentation suggest infection :   No   Dosing Weight Obtained By :   Patient stated   Weight Dosing :   54 kg(Converted to: 119 lb 1 oz)    Height :   160 cm(Converted to: 5 ft 3 in)    Body Mass Index Dosing :   21 kg/m2   BAYS, RN, SCOTT A - 12/26/2019 20:50 EST   DCP GENERIC CODE   Tracking Acuity :   3   Tracking Group :   [id number] [employer]acking [employer]   BAYS, RN, SCOTT A - 12/26/2019 20:50 EST   ED General Section :   Document assessment   Pregnancy Status :   Patient denies   ED Allergies Section :   Document assessment   ED Reason for Visit Section :   Document assessment   ED Home Meds Section :   Document assessment   BAYS, RN, SCOTT A - 12/26/2019 20:50 EST   ID Risk Screen Symptoms   Recent Travel History :   No recent travel   Close Contact with COVID-19 ID :   No   Last 14 days COVID-19 ID :   No   TB Symptom Screen :   No symptoms   C. diff Symptom/History ID :   Neither of the above   BAYS, RN, SCOTT A - 12/26/2019 20:50 EST   Allergies   (As Of: 12/26/2019 20:53:40 EST)   Allergies (Active)   Bactrim  Estimated Onset Date:   Unspecified ; Reactions:   Itching ; Created By:   SCOT RN, TINNIE B; Reaction Status:   Active ; Category:   Drug ; Substance:   Bactrim ; Type:   Allergy ;  Severity:   Moderate ; Updated By:   SCOT OBIE TINNIE KATHEE; Reviewed Date:   07/24/2019 10:36 EDT      contrast media (iodine-based)  Estimated Onset Date:   Unspecified ; Created ByBETHA EASTER, RN, JENNETTE; Reaction Status:   Active ; Category:   Drug ; Substance:   contrast media (iodine-based) ; Type:   Allergy ; Severity:   Unknown ; Updated By:   EASTER OBIE COUNTS; Reviewed Date:   07/24/2019 10:36 EDT      fentaNYL  Estimated Onset Date:   Unspecified ; Created ByBETHA EASTER, RN, JENNETTE; Reaction Status:   Active ;  Category:   Drug ; Substance:   fentaNYL ; Type:   Allergy ; Severity:   Unknown ; Updated By:   CHARLIES OBIE COUNTS; Reviewed Date:   07/24/2019 10:36 EDT      morphine  Estimated Onset Date:   Unspecified ; Created ByBETHA CHARLIES, RN, JENNETTE; Reaction Status:   Active ; Category:   Drug ; Substance:   morphine ; Type:   Allergy ; Severity:   Unknown ; Updated By:   CHARLIES OBIE COUNTS; Reviewed Date:   07/24/2019 10:36 EDT      Rocephin  Estimated Onset Date:   Unspecified ; Created By:   CHARLIES RN, JENNETTE; Reaction Status:   Active ; Category:   Drug ; Substance:   Rocephin ; Type:   Allergy ; Severity:   Unknown ; Updated By:   CHARLIES OBIE COUNTS; Reviewed Date:   07/24/2019 10:36 EDT        Psycho-Social   Last 3 mo, thoughts killing self/others :   Patient denies   ED Behavioral Activity Rating Scale :   4 - Quiet and awake (normal level of activity)   BAYS, RN, SCOTT A - 12/26/2019 20:50 EST   ED Home Med List   Medication List   (As Of: 12/26/2019 20:53:40 EST)   Prescription/Discharge Order    promethazine  :   promethazine ; Status:   Prescribed ; Ordered As Mnemonic:   Phenergan 25 mg rectal suppository ; Simple Display Line:   1 tabs, PR, q6hr, for 5 days, PRN: nausea, 6 supp, 1 Refill(s) ; Ordering Provider:   CANDIDA ALM NED; Catalog Code:   promethazine ; Order Dt/Tm:   11/09/2019 10:52:11 EST          promethazine  :   promethazine ; Status:    Prescribed ; Ordered As Mnemonic:   promethazine 25 mg oral tablet ; Simple Display Line:   25 mg, 1 tabs, Oral, q6hr, for 7 days, PRN: nausea, 20 tabs, 0 Refill(s) ; Ordering Provider:   CANDIDA ALM NED; Catalog Code:   promethazine ; Order Dt/Tm:   11/09/2019 10:52:15 EST            Home Meds    clonazePAM  :   clonazePAM ; Status:   Documented ; Ordered As Mnemonic:   KlonoPIN 0.5 mg oral tablet ; Simple Display Line:   0.5 mg, 1 tabs, Oral, Once a Day (at bedtime), 0 Refill(s) ; Catalog Code:   clonazePAM ; Order Dt/Tm:   03/28/2019 09:26:46 EDT ; Comment:   SOUND ALIKE / LOOK ALIKE - VERIFY DRUG  SOUND ALIKE / LOOK ALIKE - VERIFY DRUG          folic acid  :   folic acid ; Status:   Documented ; Ordered As Mnemonic:   folic acid 1 mg oral tablet ; Simple Display Line:   1 mg, 1 tabs, Oral, Daily, 0 Refill(s) ; Catalog Code:   folic acid ; Order Dt/Tm:   03/28/2019 90:71:74 EDT          docusate  :   docusate ; Status:   Documented ; Ordered As Mnemonic:   Colace 100 mg oral capsule ; Simple Display Line:   100 mg, 1 caps, Oral, BID, PRN: as needed for constipation, 0 Refill(s) ; Catalog Code:   docusate ; Order Dt/Tm:   03/28/2019 09:31:40 EDT          mirtazapine  :  mirtazapine ; Status:   Documented ; Ordered As Mnemonic:   Remeron 15 mg oral tablet ; Simple Display Line:   15 mg, 1 tabs, Oral, Once a Day (at bedtime), 30 tabs, 0 Refill(s) ; Catalog Code:   mirtazapine ; Order Dt/Tm:   03/28/2019 90:71:47 EDT          HYDROmorphone  :   HYDROmorphone ; Status:   Documented ; Ordered As Mnemonic:   HYDROmorphone 16 mg oral tablet, extended release ; Simple Display Line:   16 mg, 1 tabs, Oral, Daily, 0 Refill(s) ; Catalog Code:   HYDROmorphone ; Order Dt/Tm:   11/09/2019 07:49:35 EST ; Comment:    THIS MEDICATION IS ASSOCIATED   WITH   AN INCREASED RISK OF FALLS.          albuterol  :   albuterol ; Status:   Documented ; Ordered As Mnemonic:   Proventil HFA 90 mcg/inh inhalation aerosol ; Simple Display Line:    2 puffs, Inhale, q4hr, PRN, 6.7 g, 0 Refill(s) ; Catalog Code:   albuterol ; Order Dt/Tm:   03/28/2019 09:31:25 EDT          promethazine  :   promethazine ; Status:   Documented ; Ordered As Mnemonic:   promethazine 25 mg oral tablet ; Simple Display Line:   25 mg, 1 tabs, Oral, q6hr, PRN: as needed for nausea/vomiting, 0 Refill(s) ; Catalog Code:   promethazine ; Order Dt/Tm:   03/28/2019 09:32:47 EDT          oxyCODONE  :   oxyCODONE ; Status:   Documented ; Ordered As Mnemonic:   oxyCODONE 30 mg oral tablet ; Simple Display Line:   30 mg, 1 tabs, Oral, q4hr, PRN: for pain, 0 Refill(s) ; Catalog Code:   oxyCODONE ; Order Dt/Tm:   03/28/2019 09:29:01 EDT          sodium bicarbonate  :   sodium bicarbonate ; Status:   Documented ; Ordered As Mnemonic:   sodium bicarbonate 650 mg oral tablet ; Simple Display Line:   325 mg, 0.5 tabs, Oral, BID, 0 Refill(s) ; Ordering Provider:   TAMALA HINDERS; Catalog Code:   sodium bicarbonate ; Order Dt/Tm:   04/18/2019 14:27:46 EDT          citalopram  :   citalopram ; Status:   Documented ; Ordered As Mnemonic:   CeleXA 40 mg oral tablet ; Simple Display Line:   40 mg, 1 tabs, Oral, Daily, 30 tabs, 0 Refill(s) ; Catalog Code:   citalopram ; Order Dt/Tm:   03/28/2019 09:26:41 EDT ; Comment:   SOUND ALIKE / LOOK ALIKE - VERIFY DRUG          bisacodyl  :   bisacodyl ; Status:   Documented ; Ordered As Mnemonic:   Dulcolax Laxative 5 mg oral delayed release tablet ; Simple Display Line:   5 mg, 1 tabs, Oral, Daily, PRN, 0 Refill(s) ; Catalog Code:   bisacodyl ; Order Dt/Tm:   03/28/2019 09:31:31 EDT ; Comment:   DO NOT CRUSH          cyclobenzaprine  :   cyclobenzaprine ; Status:   Documented ; Ordered As Mnemonic:   cyclobenzaprine 5 mg oral tablet ; Simple Display Line:   5 mg, 1 tabs, Oral, TID, for 14 days, 42 tabs, 0 Refill(s) ; Catalog Code:   cyclobenzaprine ; Order Dt/Tm:   04/17/2019 19:33:49 EDT ; Comment:  THIS MEDICATION IS ASSOCIATED   WITH   AN INCREASED RISK OF  FALLS.          diphenhydrAMINE  :   diphenhydrAMINE ; Status:   Documented ; Ordered As Mnemonic:   Benadryl 25 mg oral tablet ; Simple Display Line:   50 mg, 2 tabs, Oral, PRN: prn, 0 Refill(s) ; Catalog Code:   diphenhydrAMINE ; Order Dt/Tm:   03/28/2019 09:31:07 EDT          ibuprofen  :   ibuprofen ; Status:   Documented ; Ordered As Mnemonic:   ibuprofen 800 mg oral tablet ; Simple Display Line:   800 mg, 1 tabs, Oral, TID, PRN: for pain, 30 tabs, 0 Refill(s) ; Catalog Code:   ibuprofen ; Order Dt/Tm:   03/28/2019 90:71:53 EDT            ED Reason for Visit   (As Of: 12/26/2019 20:53:40 EST)   Problems(Active)    Alteration in comfort: pain (SNOMED CT  :65759985 )  Name of Problem:   Alteration in comfort: pain ; Recorder:   SYSTEM,  SYSTEM; Confirmation:   Confirmed ; Classification:   Interdisciplinary ; Code:   [id number] ; Last Updated:   03/28/2019 2:23 EDT ; Life Cycle Date:   03/28/2019 ; Life Cycle Status:   Active ; Vocabulary:   SNOMED CT   ; Comments:        03/28/2019 2:23 - SYSTEM,  SYSTEM  Problem added automatically by system based on initiation of Alteration in Comfort Plan of Care      Hypertension (IMO  :13508 )  Name of Problem:   Hypertension ; Recorder:   CHARLIES, RN, JENNETTE; Confirmation:   Confirmed ; Classification:   Medical ; Code:   [phone] ; Contributor System:   PowerChart ; Last Updated:   11/08/2015 5:36 EST ; Life Cycle Date:   11/08/2015 ; Life Cycle Status:   Active ; Vocabulary:   IMO        Shortness of breath (IMO  :72576 )  Name of Problem:   Shortness of breath ; Recorder:   SYSTEM,  SYSTEM; Confirmation:   Confirmed ; Classification:   Patient Stated ; Code:   72576 ; Last Updated:   04/17/2019 15:58 EDT ; Life Cycle Date:   04/17/2019 ; Life Cycle Status:   Active ; Vocabulary:   IMO        Sickle cell (SNOMED CT  :16827988 )  Name of Problem:   Sickle cell ; Recorder:   ALBAYALDE, RN, JENNETTE; Confirmation:   Confirmed ; Classification:   Medical ; Code:   [id number] ; Contributor  System:   PowerChart ; Last Updated:   11/08/2015 5:36 EST ; Life Cycle Date:   11/08/2015 ; Life Cycle Status:   Active ; Vocabulary:   SNOMED CT          Diagnoses(Active)    Sickle cell pain  Date:   12/26/2019 ; Diagnosis Type:   Reason For Visit ; Confirmation:   Complaint of ; Clinical Dx:   Sickle cell pain ; Classification:   Medical ; Clinical Service:   Emergency medicine ; Code:   PNED ; Probability:   0 ; Diagnosis Code:   [id number]

## 2019-12-27 LAB — DIFFERENTIAL, MANUAL
Absolute Baso #: 1.1 10*3/uL — ABNORMAL HIGH (ref 0.0–0.2)
Absolute Lymph #: 10.5 10*3/uL — ABNORMAL HIGH (ref 1.0–3.2)
Absolute Mono #: 1.8 10*3/uL — ABNORMAL HIGH (ref 0.3–1.0)
Basophils %: 5 % — ABNORMAL HIGH (ref 0–2)
Lymphocytes: 48 % — ABNORMAL HIGH (ref 15–45)
Monocytes: 8 % (ref 4–12)
Neutrophils %. Manual count: 39 % — ABNORMAL LOW (ref 42–74)
Neutrophils Absolute: 8.5 10*3/uL — ABNORMAL HIGH (ref 1.6–7.3)
Nucleated RBCs: 1 100WBC — ABNORMAL HIGH (ref 0–0)
Platelet Estimate: ADEQUATE
RBC Morphology: ABNORMAL — AB

## 2019-12-27 LAB — COMPREHENSIVE METABOLIC PANEL
ALT: 11 U/L (ref 0–33)
AST: 15 U/L (ref 0–32)
Albumin/Globulin Ratio: 1.72 mmol/L (ref 1.00–2.00)
Albumin: 4.3 g/dL (ref 3.5–5.2)
Alk Phosphatase: 71 U/L (ref 35–117)
Anion Gap: 11 mmol/L (ref 2–17)
BUN: 7 mg/dL (ref 6–20)
CO2: 13 mmol/L — ABNORMAL LOW (ref 22–29)
Calcium: 8.5 mg/dL — ABNORMAL LOW (ref 8.6–10.0)
Chloride: 112 mmol/L — ABNORMAL HIGH (ref 98–107)
Creatinine: 0.5 mg/dL (ref 0.5–1.0)
GFR African American: 146 mL/min/{1.73_m2} (ref >=90)
GFR Non-African American: 126 mL/min/{1.73_m2} (ref >=90)
Globulin: 2.5 g/dL (ref 1.9–4.4)
Glucose: 106 mg/dL — ABNORMAL HIGH (ref 70–99)
OSMOLALITY CALCULATED: 270 mOsm/kg (ref 270–287)
Potassium: 2.8 mmol/L — CL (ref 3.5–5.3)
Sodium: 136 mmol/L (ref 135–145)
Total Bilirubin: 2.4 mg/dL — ABNORMAL HIGH (ref 0.00–1.20)
Total Protein: 6.8 g/dL (ref 6.4–8.3)

## 2019-12-27 LAB — CBC WITH AUTO DIFFERENTIAL
Hematocrit: 18.6 % — CL (ref 34.0–47.0)
Hemoglobin: 6.3 g/dL — CL (ref 11.5–15.7)
MCH: 26.5 pg — ABNORMAL LOW (ref 27.0–34.5)
MCHC: 33.9 g/dL (ref 32.0–36.0)
MCV: 78.2 fL — ABNORMAL LOW (ref 81.0–99.0)
MPV: 9.8 fL (ref 7.2–13.2)
NRBC Absolute: 0.14 10*3/uL — ABNORMAL HIGH (ref 0.000–0.012)
NRBC Automated: 0.6 % — ABNORMAL HIGH (ref 0.0–0.2)
Platelets: 359 10*3/uL (ref 140–440)
RBC: 2.38 x10e6/mcL — ABNORMAL LOW (ref 3.60–5.20)
RDW: 18.6 % — ABNORMAL HIGH (ref 11.0–16.0)
WBC: 21.9 10*3/uL — ABNORMAL HIGH (ref 3.8–10.6)

## 2019-12-27 LAB — MAGNESIUM: Magnesium: 1.9 mg/dL (ref 1.6–2.6)

## 2019-12-27 LAB — LIPASE: Lipase: 37 U/L (ref 13–60)

## 2019-12-27 LAB — POTASSIUM: Potassium: 3.2 mmol/L — ABNORMAL LOW (ref 3.5–5.3)

## 2019-12-27 LAB — RETICULOCYTES
RBC: 2.38 x10e6/mcL — ABNORMAL LOW (ref 3.60–5.20)
Retic Ct Abs: 0.1856 /mcL — ABNORMAL HIGH (ref 0.0210–0.1080)
Retic Ct Pct: 7.8 % — ABNORMAL HIGH (ref 0.5–2.0)

## 2019-12-27 NOTE — Discharge Summary (Signed)
 ED Clinical Summary                     [facility]  [address]  [place], [place] [address]  [phone]          PERSON INFORMATION  Name: Judith [name], Judith [name] Age:  34 Years DOB: 10/14/1986   Sex: Female Language: English PCP: PCP,  NONE   Marital Status: Single Phone: [phone] Med Service: MED-Medicine   MRN: [id number] Acct# [id number] Arrival: 12/26/2019 20:46:00   Visit Reason: Vomiting; Sickle cell pain; SICKLE CELL PAIN Acuity: 3 LOS: 000 07:19   Address:    [address] [place] [address]   Diagnosis:    Hypokalemia; Sickle cell crisis; Vomiting  Medications:          New Medications  Printed Prescriptions  ondansetron (Zofran ODT 4 mg oral tablet, disintegrating) 1 Tabs Oral (given by mouth) every 6 hours as needed nausea/vomiting for 3 Days. Refills: 0.  Last Dose:____________________  potassium chloride (potassium chloride 20 mEq oral tablet, extended release) 1 Tabs Oral (given by mouth) every day for 10 Days. Refills: 0.  Last Dose:____________________  Medications that have not changed  Other Medications  albuterol (Proventil HFA 90 mcg/inh inhalation aerosol) 2 Puffs Inhale (breathe in) every 4 hours as needed.  Last Dose:____________________  bisacodyl (Dulcolax Laxative 5 mg oral delayed release tablet) 1 Tabs Oral (given by mouth) every day as needed., DO NOT CRUSH  Last Dose:____________________  citalopram (CeleXA 40 mg oral tablet) 1 Tabs Oral (given by mouth) every day., SOUND ALIKE / LOOK ALIKE - VERIFY DRUG  Last Dose:____________________  clonazePAM (KlonoPIN 0.5 mg oral tablet) 1 Tabs Oral (given by mouth) Once a Day (at bedtime)., SOUND ALIKE / LOOK ALIKE - VERIFY DRUG   SOUND ALIKE / LOOK ALIKE - VERIFY DRUG  Last Dose:____________________  cyclobenzaprine (cyclobenzaprine 5 mg oral tablet) 1 Tabs Oral (given by mouth) 3 times a day for 14 Days.,  THIS MEDICATION IS ASSOCIATED  WITH  AN INCREASED RISK OF FALLS.  Last  Dose:____________________  diphenhydrAMINE (Benadryl 25 mg oral tablet) 2 Tabs Oral (given by mouth) as needed prn.  Last Dose:____________________  docusate (Colace 100 mg oral capsule) 1 Capsules Oral (given by mouth) 2 times a day as needed as needed for constipation.  Last Dose:____________________  folic acid (folic acid 1 mg oral tablet) 1 Tabs Oral (given by mouth) every day.  Last Dose:____________________  HYDROmorphone (HYDROmorphone 16 mg oral tablet, extended release) 1 Tabs Oral (given by mouth) every day.,  THIS MEDICATION IS ASSOCIATED  WITH  AN INCREASED RISK OF FALLS.  Last Dose:____________________  ibuprofen (ibuprofen 800 mg oral tablet) 1 Tabs Oral (given by mouth) 3 times a day as needed for pain.  Last Dose:____________________  mirtazapine (Remeron 15 mg oral tablet) 1 Tabs Oral (given by mouth) Once a Day (at bedtime).  Last Dose:____________________  oxyCODONE (oxyCODONE 30 mg oral tablet) 1 Tabs Oral (given by mouth) every 4 hours as needed for pain.  Last Dose:____________________  promethazine (Phenergan 25 mg rectal suppository) 1 Tabs Per rectum (in the rectum) every 6 hours as needed nausea for 5 Days. Refills: 1.  Last Dose:____________________  promethazine (promethazine 25 mg oral tablet) 1 Tabs Oral (given by mouth) every 6 hours as needed nausea for 7 Days. Refills: 0.  Last Dose:____________________  promethazine (promethazine 25 mg oral tablet) 1 Tabs Oral (given by mouth) every 6 hours as needed  as needed for nausea/vomiting.  Last Dose:____________________  sodium bicarbonate (sodium bicarbonate 650 mg oral tablet) 0.5 Tabs Oral (given by mouth) 2 times a day.  Last Dose:____________________      Medications Administered During Visit:                Medication Dose Route   Sodium Chloride 0.9% 1000 mL IV Piggyback   ondansetron 4 mg IV Push   promethazine 12.5 mg IV Push   hydromorphone 1 mg IV Push   diphenhydrAMINE 25 mg Oral   potassium chloride 10 mEq IV Piggyback    potassium chloride 10 mEq IV Piggyback   hydromorphone 1 mg IV Push   potassium chloride 40 mEq Oral   hydromorphone 1 mg IV Push   Sodium Chloride 0.9% 1000 mL IV Piggyback   heparin flush 500 units Intracath               Allergies      Rocephin      fentaNYL      morphine      contrast media (iodine-based)      Bactrim (Itching)      Major Tests and Procedures:  The following procedures and tests were performed during your ED visit.  COMMON PROCEDURES%>  COMMON PROCEDURES COMMENTS%>                PROVIDER INFORMATION               Provider Role Assigned Sampson CHALET, RN, EDSEL K ED Nurse 12/26/2019 22:54:48    LAURETHA NIEMANN ED MidLevel 12/26/2019 22:56:38        Attending Physician:  LAURETHA NIEMANN      Admit Doc  MALONE-PA,  THERESA     Consulting Doc       VITALS INFORMATION  Vital Sign Triage Latest   Temp Oral ORAL_1%> ORAL%>   Temp Temporal TEMPORAL_1%> TEMPORAL%>   Temp Intravascular INTRAVASCULAR_1%> INTRAVASCULAR%>   Temp Axillary AXILLARY_1%> AXILLARY%>   Temp Rectal RECTAL_1%> RECTAL%>   02 Sat 99 % 100 %   Respiratory Rate RATE_1%> RATE%>   Peripheral Pulse Rate PULSE RATE_1%> PULSE RATE%>   Apical Heart Rate HEART RATE_1%> HEART RATE%>   Blood Pressure BLOOD PRESSURE_1%>/ BLOOD PRESSURE_1%>76 mmHg BLOOD PRESSURE%> / BLOOD PRESSURE%>102 mmHg                 Immunizations      No Immunizations Documented This Visit          DISCHARGE INFORMATION   Discharge Disposition: H Outpt-Sent Home   Discharge Location:  Home   Discharge Date and Time:  12/27/2019 04:05:03   ED Checkout Date and Time:  12/27/2019 04:05:03     DEPART REASON INCOMPLETE INFORMATION               Depart Action Incomplete Reason   Interactive View/I&O Recently assessed               Problems      Active           Hypertension          Sickle cell              Smoking Status      Former smoker         PATIENT EDUCATION INFORMATION  Instructions:     Hypokalemia; Sickle Cell Anemia, Adult, Easy-to-Read     Follow  up:  With: Address: When:   [name], View Only Providers - No Access [address] [place], [place] [address]  [phone] Business (1) In 2 days   Comments:   Return to ED if symptoms worsen              ED PROVIDER DOCUMENTATION

## 2019-12-27 NOTE — ED Notes (Signed)
 ED Patient Summary       ;       [facility] Emergency Department  [address] [place] [address]  [phone]  Discharge Instructions (Patient)  _______________________________________     Name: Judith [name], Judith [name]  DOB: [birth year]                   MRN: [id number]                   FIN: [phone]>[phone]  Reason For Visit: Vomiting; Sickle cell pain; SICKLE CELL PAIN  Final Diagnosis: Hypokalemia; Sickle cell crisis; Vomiting     Visit Date: 12/26/2019 20:46:00  Address: [address] [place] [address]  Phone: [phone]     Emergency Department Providers:         Primary Physician:            [facility] would like to thank you for allowing us  to assist you with your healthcare needs. The following includes patient education materials and information regarding your injury/illness.     Follow-up Instructions:  You were seen today on an emergency basis. Please contact your primary care doctor for a follow up appointment. If you received a referral to a specialist doctor, it is important you follow-up as instructed.    It is important that you call your follow-up doctor to schedule and confirm the location of your next appointment. Your doctor may practice at multiple locations. The office location of your follow-up appointment may be different to the one written on your discharge instructions.    If you do not have a primary care doctor, please call [phone] for help in finding a [name] Provider. For help in finding a specialist doctor, please call [phone].    The [facility] "Ask a Nurse" line in staffed by Registered Nurses and is a free service to the community. We are available Monday - Friday from 8am to 5pm to answer your questions about your health. Please call [phone].    If your condition gets worse before your follow-up with your primary care doctor or specialist, please return to the Emergency  Department.        Coronavirus 2019 (COVID-19) Reminders:     Patients 65 and older can contact their [name] doctors' offices to schedule appointments to receive the COVID-19 vaccine at the [facility] or send us  an email at [email or web address].            Scan this code with your phone camera to send an email to the address above.      Patients who are 29 and older who do not have a [facility] physician can call [phone] to schedule vaccination appointments.         Follow Up Appointments:  Primary Care Provider:      Name: PCP,  NONE      Phone:                  With: Address: When:   [name], View Only Providers - No Access [address] [place], [place] [address]  [phone] Business (1) In 2 days   Comments:   Return to ED if symptoms worsen              Printed Prescriptions:    Patient Education Materials:  Discharge Orders  Discharge Patient 12/27/19 3:52:00 EST         Comment:      Hypokalemia; Sickle Cell Anemia, Adult, Easy-to-Read     Hypokalemia    Hypokalemia means that the amount of potassium in the blood is lower than normal.?Potassium is a chemical, called an electrolyte, that helps regulate the amount of fluid in the body. It also stimulates muscle contraction and helps nerves function properly.?Most of the body's potassium is inside of cells, and only a very small amount is in the blood. Because the amount in the blood is so small, minor changes can be life-threatening.    CAUSES     Antibiotics.     Diarrhea or vomiting.     Using laxatives too much, which can cause diarrhea.     Chronic kidney disease.     Water pills (diuretics).     Eating disorders (bulimia).     Low magnesium level.     Sweating a lot.    SIGNS AND SYMPTOMS     Weakness.     Constipation.     Fatigue.      Muscle cramps.     Mental confusion.     Skipped heartbeats or irregular heartbeat (palpitations).      Tingling or numbness.     DIAGNOSIS     Your health care provider can diagnose hypokalemia with blood tests. In addition to checking your potassium level, your health care provider may also check other lab tests.    TREATMENT    Hypokalemia can be treated with potassium supplements taken by mouth or adjustments in your current medicines. If your potassium level is very low, you may need to get potassium through a vein (IV) and be monitored in the hospital. A diet high in potassium is also helpful. Foods high in potassium are:     Nuts, such as peanuts and pistachios.      Seeds, such as sunflower seeds and pumpkin seeds.     Peas, lentils, and lima beans.      Whole grain and bran cereals and breads.     Fresh fruit and vegetables, such as apricots, avocado, bananas, cantaloupe, kiwi, oranges, tomatoes, asparagus, and potatoes.     Orange and tomato juices.     Red meats.     Fruit yogurt.    HOME CARE INSTRUCTIONS     Take all medicines as prescribed by your health care provider.     Maintain a healthy diet by including nutritious food, such as fruits, vegetables, nuts, whole grains, and lean meats.     If you are taking a laxative, be sure to follow the directions on the label.    SEEK MEDICAL CARE IF:     Your weakness gets worse.     You feel your heart pounding or racing.     You are vomiting or having diarrhea.     You are diabetic and having trouble keeping your blood glucose in the normal range.    SEEK IMMEDIATE MEDICAL CARE IF:     You have chest pain, shortness of breath, or dizziness.     You are vomiting or having diarrhea for more than 2 days.     You faint.    MAKE SURE YOU:     Understand these instructions.     Will watch your condition.     Will get help right away if you are not doing well or get worse.    This information  is not intended to replace advice given to you by your health care provider. Make sure you discuss any questions you have with your health care provider.    Document Released: 10/23/2005 Document Revised: 11/13/2014  Document Reviewed: 04/25/2013  Elsevier Interactive Patient Education ?2016 Elsevier Inc.       Sickle Cell Anemia, Adult    Sickle cell anemia is a condition where your red blood cells are shaped like sickles. Red blood cells carry oxygen through the body. Sickle-shaped red blood cells do not live as long as normal red blood cells. They also clump together and block blood from flowing through the blood vessels. These things prevent the body from getting enough oxygen. Sickle cell anemia causes organ damage and pain. It also increases the risk of infection.    HOME CARE     Drink enough fluid to keep your pee (urine) clear or pale yellow. Drink more in hot weather and during exercise.      Do not smoke. Smoking lowers oxygen levels in the blood.      Only take over-the-counter or prescription medicines as told by your doctor.     Take antibiotic medicines as told by your doctor. Make sure you finish them even if you start to feel better.      Take supplements as told by your doctor.      Consider wearing a medical alert bracelet. This tells anyone caring for you in an emergency of your condition.      When traveling, keep your medical information, doctors' names, and the medicines you take with you at all times.      If you have a fever, do not take fever medicines right away. This could cover up a problem. Tell your doctor.      Keep all follow-up visits with your doctor. Sickle cell anemia requires regular medical care.      GET HELP IF:    You have a fever.    GET HELP RIGHT AWAY IF:     You feel dizzy or faint.      You have new belly (abdominal) pain, especially on the left side near the stomach area.      You have a lasting, often uncomfortable and painful erection of the penis (priapism). If it is not treated right away, you will become unable to have sex (impotence).      You have numbness in your arms or legs or you have a hard time moving them.      You have a hard time talking.     You have a fever or  lasting symptoms for more than 2?3 days.      You have a fever and your symptoms suddenly get worse.      You have signs or symptoms of infection. These include:     ? Chills.     ? Being more tired than normal (lethargy).     ? Irritability.     ? Poor eating.     ? Throwing up (vomiting).      You have pain that is not helped with medicine.      You have shortness of breath.     You have pain in your chest.      You are coughing up pus-like or bloody mucus.      You have a stiff neck.     Your feet or hands swell or have pain.     Your belly looks bloated.  Your joints hurt.    MAKE SURE YOU:     Understand these instructions.      Will watch your condition.     Will get help right away if you are not doing well or get worse.    This information is not intended to replace advice given to you by your health care provider. Make sure you discuss any questions you have with your health care provider.    Document Released: 08/13/2013 Document Revised: 03/09/2015 Document Reviewed: 08/13/2013  Elsevier Interactive Patient Education ?2016 Elsevier Inc.         Allergy Info: fentaNYL; contrast media (iodine-based); Rocephin; Bactrim; morphine     Medication Information:  [facility] ED Physicians provided you with a complete list of medications post discharge, if you have been instructed to stop taking a medication please ensure you also follow up with this information to your Primary Care Physician.  Unless otherwise noted, patient will continue to take medications as prescribed prior to the Emergency Room visit.  Any specific questions regarding your chronic medications and dosages should be discussed with your physician(s) and pharmacist.          albuterol (Proventil HFA 90 mcg/inh inhalation aerosol) 2 Puffs Inhale (breathe in) every 4 hours as needed.  bisacodyl (Dulcolax Laxative 5 mg oral delayed release tablet) 1 Tabs Oral (given by mouth) every day as needed., DO NOT CRUSH  citalopram (CeleXA 40 mg  oral tablet) 1 Tabs Oral (given by mouth) every day., SOUND ALIKE / LOOK ALIKE - VERIFY DRUG  clonazePAM (KlonoPIN 0.5 mg oral tablet) 1 Tabs Oral (given by mouth) Once a Day (at bedtime)., SOUND ALIKE / LOOK ALIKE - VERIFY DRUG   SOUND ALIKE / LOOK ALIKE - VERIFY DRUG  cyclobenzaprine (cyclobenzaprine 5 mg oral tablet) 1 Tabs Oral (given by mouth) 3 times a day for 14 Days.,  THIS MEDICATION IS ASSOCIATED  WITH  AN INCREASED RISK OF FALLS.  diphenhydrAMINE (Benadryl 25 mg oral tablet) 2 Tabs Oral (given by mouth) as needed prn.  docusate (Colace 100 mg oral capsule) 1 Capsules Oral (given by mouth) 2 times a day as needed as needed for constipation.  folic acid (folic acid 1 mg oral tablet) 1 Tabs Oral (given by mouth) every day.  HYDROmorphone (HYDROmorphone 16 mg oral tablet, extended release) 1 Tabs Oral (given by mouth) every day.,  THIS MEDICATION IS ASSOCIATED  WITH  AN INCREASED RISK OF FALLS.  ibuprofen (ibuprofen 800 mg oral tablet) 1 Tabs Oral (given by mouth) 3 times a day as needed for pain.  mirtazapine (Remeron 15 mg oral tablet) 1 Tabs Oral (given by mouth) Once a Day (at bedtime).  ondansetron (Zofran ODT 4 mg oral tablet, disintegrating) 1 Tabs Oral (given by mouth) every 6 hours as needed nausea/vomiting for 3 Days. Refills: 0.  oxyCODONE (oxyCODONE 30 mg oral tablet) 1 Tabs Oral (given by mouth) every 4 hours as needed for pain.  potassium chloride (potassium chloride 20 mEq oral tablet, extended release) 1 Tabs Oral (given by mouth) every day for 10 Days. Refills: 0.  promethazine (Phenergan 25 mg rectal suppository) 1 Tabs Per rectum (in the rectum) every 6 hours as needed nausea for 5 Days. Refills: 1.  promethazine (promethazine 25 mg oral tablet) 1 Tabs Oral (given by mouth) every 6 hours as needed nausea for 7 Days. Refills: 0.  promethazine (promethazine 25 mg oral tablet) 1 Tabs Oral (given by mouth) every 6 hours as needed as needed  for nausea/vomiting.  sodium bicarbonate (sodium  bicarbonate 650 mg oral tablet) 0.5 Tabs Oral (given by mouth) 2 times a day.      Medications Administered During Visit:              Medication Dose Route   Sodium Chloride 0.9% 1000 mL IV Piggyback   ondansetron 4 mg IV Push   promethazine 12.5 mg IV Push   hydromorphone 1 mg IV Push   diphenhydrAMINE 25 mg Oral   potassium chloride 10 mEq IV Piggyback   potassium chloride 10 mEq IV Piggyback   hydromorphone 1 mg IV Push   potassium chloride 40 mEq Oral   hydromorphone 1 mg IV Push   Sodium Chloride 0.9% 1000 mL IV Piggyback   heparin flush 500 units Intracath          Major Tests and Procedures:  The following procedures and tests were performed during your ED visit.  COMMON PROCEDURES%>  COMMON PROCEDURES COMMENTS%>          Laboratory Orders  Name Status Details   CBCDIFF Completed Blood, Stat, ST - Stat, 12/26/19 23:35:00 EST, 12/26/19 23:36:00 EST, Nurse collect, MALONE-PA,  THERESA, Print label Y/N   CMP Completed Blood, Stat, ST - Stat, 12/26/19 23:35:00 EST, 12/26/19 23:36:00 EST, Nurse collect, MALONE-PA,  THERESA, Print label Y/N   Diff Man Completed Blood, Stat, ST - Stat, 12/26/19 23:35:00 EST, 12/26/19 23:35:00 EST, Nurse collect, 12/26/19 23:56:00 US Robinette PITCH,  ZEBEDEE, [phone]   K Completed Blood, Stat, ST - Stat, 12/27/19 2:40:00 EST, 12/27/19 2:41:00 EST, Nurse collect, PLOTKIN, RN, DANIELLE K, Print label Y/N   Lipase Lvl Completed Blood, Stat, ST - Stat, 12/26/19 23:35:00 EST, 12/26/19 23:36:00 EST, Nurse collect, MALONE-PA,  THERESA, Print label Y/N   Mg Completed Blood, Stat, ST - Stat, 12/26/19 23:35:00 EST, 12/26/19 23:36:00 EST, Nurse collect, MALONE-PA,  THERESA, Print label Y/N   Retic Auto Completed Blood, Stat, ST - Stat, 12/26/19 23:35:00 EST, 12/26/19 23:36:00 EST, Nurse collect, MALONE-PA,  THERESA, Print label Y/N               Radiology Orders  No radiology orders were placed.              Patient Care Orders  Name Status Details   Discharge Patient Ordered  12/27/19 3:52:00 EST   ED Assessment Adult Completed 12/26/19 20:53:42 EST, 12/26/19 20:53:42 EST   ED Secondary Triage Completed 12/26/19 20:53:42 EST, 12/26/19 20:53:42 EST   ED Triage Adult Completed 12/26/19 20:46:46 EST, 12/26/19 20:46:46 EST   May Access Port Completed 12/26/19 23:35:00 EST, This message can only be seen by Nursing, it is not visible to Pharmacy, Laboratory, or Radiology., 12/26/19 23:35:00 EST, 12/26/19 23:35:00 EST, Once   Sepsis Quality Measures Ordered 12/27/19 0:33:17 EST, 12/27/19 0:33:17 EST       ---------------------------------------------------------------------------------------------------------------------  [facility]) encourages you to self-enroll in the [facility] Patient Portal.  [facility] Patient Portal will allow you to manage your personal health information securely from your own electronic device now and in the future.  To begin your Patient Portal enrollment process, please visit [email or web address] Click on "Sign up now" under [facility].  If you find that you need additional assistance on the [facility] Patient Portal or need a copy of your medical records, please call the [facility] Medical Records Office at [phone].  Comment:

## 2019-12-27 NOTE — ED Provider Notes (Signed)
Vomiting        Patient:   Judith [name], Judith [name]            MRN: [id number]            FIN: [id number]               Age:   34 years     Sex:  Female     DOB:  08-Sep-1986   Associated Diagnoses:   Vomiting; Hypokalemia; Sickle cell crisis   Author:   Willaim Sheng      Basic Information   Time seen: Provider Seen (ST)   ED Provider/Time:    Ritesh Opara-PA,  Bruchy Mikel / 12/26/2019 22:56  .   History source: Patient.   Arrival mode: Private vehicle.   History limitation: None.   Additional information: Chief Complaint from Nursing Triage Note   Chief Complaint  Chief Complaint: Pt states sickle cell pain and n/v since yesterday.  Diarrhea as well. (12/26/19 20:50:00).      History of Present Illness   The patient presents with nausea and vomiting.  The course/duration of symptoms is worsening.  The character of symptoms is unknown.  The degree at present is severe.  There are exacerbating factors including eating and drinking.  Patient with arthralgias related to her sickle cell crisis along with n/v/d.  She was seen by her specialist at [facility] 3 days ago, had labs performed, and "they told me I just had to hydrate myself."  She has been taking Phenergan at home with continued n/v. She denies CP, SOB, or fever, but feels her generalized pain has worsened.  .        Review of Systems   Constitutional symptoms:  Negative except as documented in HPI.   Skin symptoms:  Negative except as documented in HPI.   Respiratory symptoms:  Negative except as documented in HPI.   Gastrointestinal symptoms:  Nausea, vomiting, diarrhea.    Musculoskeletal symptoms:  Joint pain.   Neurologic symptoms:  Negative except as documented in HPI.             Additional review of systems information: All other systems reviewed and otherwise negative.      Health Status   Allergies:    Allergic Reactions (Selected)  Moderate  Bactrim- Itching.  Unknown  Contrast media (iodine-based)- No reactions were documented.  FentaNYL- No reactions were  documented.  Morphine- No reactions were documented.  Rocephin- No reactions were documented..   Medications:  (Selected)   Inpatient Medications  Ordered  Benadryl: 25 mg, 1 caps, Oral, Once  Prescriptions  Prescribed  Phenergan 25 mg rectal suppository: 1 tabs, PR, q6hr, for 5 days, PRN: nausea, 6 supp, 1 Refill(s)  promethazine 25 mg oral tablet: 25 mg, 1 tabs, Oral, q6hr, for 7 days, PRN: nausea, 20 tabs, 0 Refill(s)  Documented Medications  Documented  Benadryl 25 mg oral tablet: 50 mg, 2 tabs, Oral, PRN: prn, 0 Refill(s)  CeleXA 40 mg oral tablet: 40 mg, 1 tabs, Oral, Daily, 30 tabs, 0 Refill(s)  Colace 100 mg oral capsule: 100 mg, 1 caps, Oral, BID, PRN: as needed for constipation, 0 Refill(s)  Dulcolax Laxative 5 mg oral delayed release tablet: 5 mg, 1 tabs, Oral, Daily, PRN, 0 Refill(s)  HYDROmorphone 16 mg oral tablet, extended release: 16 mg, 1 tabs, Oral, Daily, 0 Refill(s)  KlonoPIN 0.5 mg oral tablet: 0.5 mg, 1 tabs, Oral, Once a Day (at bedtime), 0 Refill(s)  Proventil HFA 90 mcg/inh  inhalation aerosol: 2 puffs, Inhale, q4hr, PRN, 6.7 g, 0 Refill(s)  Remeron 15 mg oral tablet: 15 mg, 1 tabs, Oral, Once a Day (at bedtime), 30 tabs, 0 Refill(s)  cyclobenzaprine 5 mg oral tablet: 5 mg, 1 tabs, Oral, TID, for 14 days, 42 tabs, 0 Refill(s)  folic acid 1 mg oral tablet: 1 mg, 1 tabs, Oral, Daily, 0 Refill(s)  ibuprofen 800 mg oral tablet: 800 mg, 1 tabs, Oral, TID, PRN: for pain, 30 tabs, 0 Refill(s)  oxyCODONE 30 mg oral tablet: 30 mg, 1 tabs, Oral, q4hr, PRN: for pain, 0 Refill(s)  promethazine 25 mg oral tablet: 25 mg, 1 tabs, Oral, q6hr, PRN: as needed for nausea/vomiting, 0 Refill(s)  sodium bicarbonate 650 mg oral tablet: 325 mg, 0.5 tabs, Oral, BID, 0 Refill(s).      Past Medical/ Family/ Social History   Medical history: Reviewed as documented in chart.   Surgical history: Reviewed as documented in chart.   Family history: Not significant.   Social history: Reviewed as documented in chart.   Problem  list:    Active Problems (4)  Alteration in comfort: pain   Hypertension   Shortness of breath   Sickle cell   .      Physical Examination               Vital Signs   Vital Signs   8/65/7846 96:29 EST Systolic Blood Pressure 528 mmHg  HI    Diastolic Blood Pressure 76 mmHg    Temperature Oral 37.1 degC    Heart Rate Monitored 110 bpm  HI    Respiratory Rate 22 br/min  HI    SpO2 99 %   .   Measurements   12/26/2019 20:53 EST Body Mass Index est meas 21.09 kg/m2   12/26/2019 20:53 EST Body Mass Index Measured 21.09 kg/m2   12/26/2019 20:50 EST Height/Length Measured 160 cm    Weight Dosing 54 kg   .   Basic Oxygen Information   12/26/2019 20:50 EST Oxygen Therapy Room air    SpO2 99 %   .   General:  Alert, no acute distress.    Skin:  Warm, intact.    Head:  Normocephalic, atraumatic.    Cardiovascular:  Regular rate and rhythm.   Respiratory:  Lungs are clear to auscultation, respirations are non-labored.    Gastrointestinal:  Soft.   Musculoskeletal:  Normal ROM, normal strength.    Neurological:  Normal motor observed, normal speech observed.    Psychiatric:  Cooperative, appropriate mood & affect.       Medical Decision Making   Differential Diagnosis:  Nausea, vomiting, abdominal pain, gastroenteritis, diarrhea, food toxicity, pancreatitis, electrolyte abnormality, biliary Colic, viral syndrome.    Documents reviewed:  Emergency department nurses' notes.   Electrocardiogram:  Emergency Provider interpretation performed by me, sinus rhythm with PVC's.    Results review:  Lab results : Lab View   12/27/2019 3:23 EST Potassium Lvl 3.2 mmol/L  LOW   12/27/2019 0:30 EST Estimated Creatinine Clearance 131.10 mL/min   12/26/2019 23:56 EST WBC 21.9 x10e3/mcL  HI    RBC 2.38 x10e6/mcL  LOW    RBC 2.38 x10e6/mcL  LOW    Hgb 6.3 g/dL  CRIT    HCT 18.6 %  CRIT    MCV 78.2 fL  LOW    MCH 26.5 pg  LOW    MCHC 33.9 g/dL    RDW 18.6 %  HI    Platelet 359 x10e3/mcL  MPV 9.8 fL    NRBC Absolute Auto 0.140 x10e3/mcL  HI    NRBC  Percent Auto 0.6 %  HI    Neutrophil M Pct 39 %  LOW    Lymphcyte M Pct 48 %  HI    Monocyte M Pct 8 %    Basophil M Pct 5 %  HI    Neutrophil M # 8.5 x10e3/mcL  HI    Lymphocyte M # 10.5 x10e3/mcL  HI    Monocyte M # 1.8 x10e3/mcL  HI    Basophil M # 1.1 x10e3/mcL  HI    Hypochromia Few    Microcytes Few    Nucleated RBC 1 100WBC  HI    PLT estimate Adequate    Poikilocytosis Few    Polychromasia Few    RBC morphology Abnormal    Sickle cells Few    Retic Percent 7.8 %  HI    Absolute Retic 0.1856 /mcL  HI    Sodium Lvl 136 mmol/L    Potassium Lvl 2.8 mmol/L  CRIT    Chloride 112 mmol/L  HI    CO2 13 mmol/L  LOW    Glucose Random 106 mg/dL  HI    BUN 7 mg/dL    Creatinine Lvl 0.5 mg/dL    AGAP 11 mmol/L    Osmolality Calc 270 mOsm/kg    Calcium Lvl 8.5 mg/dL  LOW    Protein Total 6.8 g/dL    Albumin Lvl 4.3 g/dL    Globulin Calc 2.5 g/dL    AG Ratio Calc 1.72 mmol/L    Alk Phos 71 unit/L    AST 15 unit/L    ALT 11 unit/L    eGFR AA 146 mL/min/1.92m???    eGFR Non-AA 126 mL/min/1.757m??    Magnesium Lvl 1.9 mg/dL    Bili Total 2.40 mg/dL  HI    Lipase Lvl 37 unit/L   12/26/2019 20:53 EST Estimated Creatinine Clearance 109.25 mL/min   .      Reexamination/ Reevaluation   Vital signs   Basic Oxygen Information   12/26/2019 20:50 EST Oxygen Therapy Room air    SpO2 99 %      Course: improving.   Pain status: decreased.   Assessment: exam improved.   Notes: Patient's pain improved.  Nausea resolved and patient tolerated PO trial.  Labs reviewed with Attending physician.  Suspecting significant leukocytosis is secondary to her current crisis than an infectious process.  Patient denies fever, cough, CP, or SOB.  Hb 6.8 today (patient noted to receive PRBC transfusions when under 6.0).  She is hypokalemic at 2.8.  IV KCL 2034mV administered and KCL 60m29mO given.  Repeated potassium level at 3.2.  Advised close f/u with [facility] on Monday..      Impression and Plan   Diagnosis   Vomiting (ICD10-CM R11.10, Discharge, Medical)    Hypokalemia (ICD10-CM E87.6, Discharge, Medical)   Sickle cell crisis (ICD10-CM D57.00, Discharge, Medical)   Plan   Condition: Improved, Stable.    Disposition: Medically cleared, Discharged: to home.    Prescriptions: Launch Meds List (Selected)   Prescriptions  Prescribed  Zofran ODT 4 mg oral tablet, disintegrating: 4 mg, 1 tabs, Oral, q6hr, for 3 days, PRN: nausea/vomiting, 12 tabs, 0 Refill(s)  potassium chloride 20 mEq oral tablet, extended release: 20 mEq, 1 tabs, Oral, Daily, for 10 days, 10 tabs, 0 Refill(s).    Patient was given the following educational materials: Hypokalemia, Sickle Cell Anemia, Adult,  Easy-to-Read.    Follow up with: Ronnald Ramp, View Only Providers - No Access In 2 days Return to ED if symptoms worsen.    Counseled: Patient, Regarding diagnosis, Regarding diagnostic results, Regarding treatment plan, Regarding prescription, Patient indicated understanding of instructions.    Signature Line     Electronically Signed on 12/27/2019 04:32 AM EST   ________________________________________________   Willaim Sheng               Modified by: Willaim Sheng on 12/27/2019 04:27 AM EST      Modified by: Willaim Sheng on 12/27/2019 04:32 AM ESTAddendum by Josefina Do on December 27, 2019 5:31 EST               Patient seen by PA Dimas Millin independent of me. I did not see or evaluate this patient. I was available for consultation and direct supervision if requested.   Signature Line     Electronically Signed on 12/27/2019 05:31 AM EST   ________________________________________________   Royston Sinner E               Modified by: Royston Sinner E on 12/27/2019 05:31 AM EST

## 2019-12-27 NOTE — ED Notes (Signed)
ED Patient Education Note     Patient Education Materials Follows:  Cardiovascular     Hypokalemia    Hypokalemia means that the amount of potassium in the blood is lower than normal.?Potassium is a chemical, called an electrolyte, that helps regulate the amount of fluid in the body. It also stimulates muscle contraction and helps nerves function properly.?Most of the body's potassium is inside of cells, and only a very small amount is in the blood. Because the amount in the blood is so small, minor changes can be life-threatening.    CAUSES     Antibiotics.     Diarrhea or vomiting.     Using laxatives too much, which can cause diarrhea.     Chronic kidney disease.     Water pills (diuretics).     Eating disorders (bulimia).     Low magnesium level.     Sweating a lot.    SIGNS AND SYMPTOMS     Weakness.     Constipation.     Fatigue.      Muscle cramps.     Mental confusion.     Skipped heartbeats or irregular heartbeat (palpitations).      Tingling or numbness.     DIAGNOSIS    Your health care provider can diagnose hypokalemia with blood tests. In addition to checking your potassium level, your health care provider may also check other lab tests.    TREATMENT    Hypokalemia can be treated with potassium supplements taken by mouth or adjustments in your current medicines. If your potassium level is very low, you may need to get potassium through a vein (IV) and be monitored in the hospital. A diet high in potassium is also helpful. Foods high in potassium are:     Nuts, such as peanuts and pistachios.      Seeds, such as sunflower seeds and pumpkin seeds.     Peas, lentils, and lima beans.      Whole grain and bran cereals and breads.     Fresh fruit and vegetables, such as apricots, avocado, bananas, cantaloupe, kiwi, oranges, tomatoes, asparagus, and potatoes.     Orange and tomato juices.     Red meats.     Fruit yogurt.    HOME CARE INSTRUCTIONS     Take all medicines as prescribed by your health care  provider.     Maintain a healthy diet by including nutritious food, such as fruits, vegetables, nuts, whole grains, and lean meats.     If you are taking a laxative, be sure to follow the directions on the label.    SEEK MEDICAL CARE IF:     Your weakness gets worse.     You feel your heart pounding or racing.     You are vomiting or having diarrhea.     You are diabetic and having trouble keeping your blood glucose in the normal range.    SEEK IMMEDIATE MEDICAL CARE IF:     You have chest pain, shortness of breath, or dizziness.     You are vomiting or having diarrhea for more than 2 days.     You faint.    MAKE SURE YOU:     Understand these instructions.     Will watch your condition.     Will get help right away if you are not doing well or get worse.    This information is not intended to replace advice given to you by your   health care provider. Make sure you discuss any questions you have with your health care provider.    Document Released: 10/23/2005 Document Revised: 11/13/2014 Document Reviewed: 04/25/2013  Elsevier Interactive Patient Education ?2016 [employer]      Home Health Care     Sickle Cell Anemia, Adult    Sickle cell anemia is a condition where your red blood cells are shaped like sickles. Red blood cells carry oxygen through the body. Sickle-shaped red blood cells do not live as long as normal red blood cells. They also clump together and block blood from flowing through the blood vessels. These things prevent the body from getting enough oxygen. Sickle cell anemia causes organ damage and pain. It also increases the risk of infection.    HOME CARE     Drink enough fluid to keep your pee (urine) clear or pale yellow. Drink more in hot weather and during exercise.      Do not smoke. Smoking lowers oxygen levels in the blood.      Only take over-the-counter or prescription medicines as told by your doctor.     Take antibiotic medicines as told by your doctor. Make sure you finish them even if  you start to feel better.      Take supplements as told by your doctor.      Consider wearing a medical alert bracelet. This tells anyone caring for you in an emergency of your condition.      When traveling, keep your medical information, doctors' names, and the medicines you take with you at all times.      If you have a fever, do not take fever medicines right away. This could cover up a problem. Tell your doctor.      Keep all follow-up visits with your doctor. Sickle cell anemia requires regular medical care.      GET HELP IF:    You have a fever.    GET HELP RIGHT AWAY IF:     You feel dizzy or faint.      You have new belly (abdominal) pain, especially on the left side near the stomach area.      You have a lasting, often uncomfortable and painful erection of the penis (priapism). If it is not treated right away, you will become unable to have sex (impotence).      You have numbness in your arms or legs or you have a hard time moving them.      You have a hard time talking.     You have a fever or lasting symptoms for more than 2?3 days.      You have a fever and your symptoms suddenly get worse.      You have signs or symptoms of infection. These include:     ? Chills.     ? Being more tired than normal (lethargy).     ? Irritability.     ? Poor eating.     ? Throwing up (vomiting).      You have pain that is not helped with medicine.      You have shortness of breath.     You have pain in your chest.      You are coughing up pus-like or bloody mucus.      You have a stiff neck.     Your feet or hands swell or have pain.     Your belly looks bloated.     Your joints  hurt.    MAKE SURE YOU:     Understand these instructions.      Will watch your condition.     Will get help right away if you are not doing well or get worse.    This information is not intended to replace advice given to you by your health care provider. Make sure you discuss any questions you have with your health care provider.    Document  Released: 08/13/2013 Document Revised: 03/09/2015 Document Reviewed: 08/13/2013  Elsevier Interactive Patient Education ?2016 [employer]

## 2020-02-05 DEATH — deceased
